# Patient Record
Sex: Male | Born: 1945
Health system: Southern US, Community
[De-identification: ages and names within clinical notes are randomized; demographics above are authoritative.]

## PROBLEM LIST (undated history)

## (undated) DIAGNOSIS — I2510 Atherosclerotic heart disease of native coronary artery without angina pectoris: Secondary | ICD-10-CM

## (undated) DIAGNOSIS — E785 Hyperlipidemia, unspecified: Secondary | ICD-10-CM

## (undated) DIAGNOSIS — H269 Unspecified cataract: Secondary | ICD-10-CM

## (undated) DIAGNOSIS — E119 Type 2 diabetes mellitus without complications: Secondary | ICD-10-CM

## (undated) DIAGNOSIS — J449 Chronic obstructive pulmonary disease, unspecified: Secondary | ICD-10-CM

## (undated) DIAGNOSIS — I10 Essential (primary) hypertension: Secondary | ICD-10-CM

## (undated) DIAGNOSIS — C801 Malignant (primary) neoplasm, unspecified: Secondary | ICD-10-CM

## (undated) HISTORY — DX: Essential (primary) hypertension: I10

## (undated) HISTORY — PX: EYE SURGERY: SHX253

## (undated) HISTORY — DX: Malignant (primary) neoplasm, unspecified: C80.1

## (undated) HISTORY — DX: Type 2 diabetes mellitus without complications: E11.9

## (undated) HISTORY — DX: Hyperlipidemia, unspecified: E78.5

## (undated) HISTORY — DX: Unspecified cataract: H26.9

## (undated) HISTORY — DX: Atherosclerotic heart disease of native coronary artery without angina pectoris: I25.10

---

## 2001-11-19 ENCOUNTER — Encounter: Payer: Self-pay | Admitting: Family Medicine

## 2001-11-19 ENCOUNTER — Encounter: Admission: RE | Admit: 2001-11-19 | Discharge: 2001-11-19 | Payer: Self-pay | Admitting: Family Medicine

## 2006-11-27 ENCOUNTER — Observation Stay (HOSPITAL_COMMUNITY): Admission: RE | Admit: 2006-11-27 | Discharge: 2006-11-28 | Payer: Self-pay | Admitting: Cardiology

## 2006-12-04 ENCOUNTER — Ambulatory Visit (HOSPITAL_COMMUNITY): Admission: RE | Admit: 2006-12-04 | Discharge: 2006-12-04 | Payer: Self-pay | Admitting: Cardiology

## 2006-12-30 HISTORY — PX: OTHER SURGICAL HISTORY: SHX169

## 2007-01-27 ENCOUNTER — Encounter (INDEPENDENT_AMBULATORY_CARE_PROVIDER_SITE_OTHER): Payer: Self-pay | Admitting: *Deleted

## 2007-01-27 ENCOUNTER — Inpatient Hospital Stay (HOSPITAL_COMMUNITY): Admission: RE | Admit: 2007-01-27 | Discharge: 2007-01-28 | Payer: Self-pay | Admitting: Vascular Surgery

## 2007-02-09 ENCOUNTER — Ambulatory Visit: Payer: Self-pay | Admitting: Vascular Surgery

## 2007-03-09 ENCOUNTER — Ambulatory Visit: Payer: Self-pay | Admitting: Vascular Surgery

## 2007-05-11 ENCOUNTER — Ambulatory Visit: Payer: Self-pay | Admitting: Vascular Surgery

## 2007-06-23 ENCOUNTER — Ambulatory Visit: Payer: Self-pay | Admitting: Gastroenterology

## 2007-07-20 ENCOUNTER — Ambulatory Visit: Payer: Self-pay | Admitting: Gastroenterology

## 2007-07-20 DIAGNOSIS — K5730 Diverticulosis of large intestine without perforation or abscess without bleeding: Secondary | ICD-10-CM | POA: Insufficient documentation

## 2007-08-12 ENCOUNTER — Ambulatory Visit: Payer: Self-pay | Admitting: Vascular Surgery

## 2008-02-19 ENCOUNTER — Ambulatory Visit: Payer: Self-pay | Admitting: Vascular Surgery

## 2008-04-22 DIAGNOSIS — E785 Hyperlipidemia, unspecified: Secondary | ICD-10-CM | POA: Insufficient documentation

## 2008-04-22 DIAGNOSIS — I10 Essential (primary) hypertension: Secondary | ICD-10-CM | POA: Insufficient documentation

## 2008-04-22 DIAGNOSIS — Z8551 Personal history of malignant neoplasm of bladder: Secondary | ICD-10-CM | POA: Insufficient documentation

## 2008-08-19 ENCOUNTER — Ambulatory Visit: Payer: Self-pay | Admitting: Vascular Surgery

## 2009-02-10 ENCOUNTER — Ambulatory Visit: Payer: Self-pay | Admitting: Vascular Surgery

## 2009-08-11 ENCOUNTER — Ambulatory Visit: Payer: Self-pay | Admitting: Vascular Surgery

## 2010-12-27 ENCOUNTER — Ambulatory Visit (HOSPITAL_COMMUNITY)
Admission: RE | Admit: 2010-12-27 | Discharge: 2010-12-27 | Payer: Self-pay | Source: Home / Self Care | Attending: Cardiology | Admitting: Cardiology

## 2011-03-11 LAB — GLUCOSE, CAPILLARY
Glucose-Capillary: 159 mg/dL — ABNORMAL HIGH (ref 70–99)
Glucose-Capillary: 160 mg/dL — ABNORMAL HIGH (ref 70–99)
Glucose-Capillary: 95 mg/dL (ref 70–99)

## 2011-05-14 NOTE — Procedures (Signed)
BYPASS GRAFT EVALUATION   INDICATION:  Followup evaluation of right leg bypass graft.   HISTORY:  Diabetes:  No.  Cardiac:  History of coronary stent.  Hypertension:  Yes.  Smoking:  Pack per day.  Previous Surgery:  Right external iliac to profunda artery bypass graft.   SINGLE LEVEL ARTERIAL EXAM                               RIGHT              LEFT  Brachial:                    128                128  Anterior tibial:             64                 120  Posterior tibial:            88                 118  Peroneal:  Ankle/brachial index:        0.69               0.94   PREVIOUS ABI:  Date:  08/19/2008  RIGHT:  0.67  LEFT:  0.94   LOWER EXTREMITY BYPASS GRAFT DUPLEX EXAM:   DUPLEX:  Doppler arterial waveforms are biphasic to monophasic proximal  to, within and distal to the bypass graft with an elevated peak systolic  velocity at the first distal anastomosis of 162 cm/second which is  stable compared to previous studies.   IMPRESSION:  1. ABIs are stable from previous studies bilaterally.  2. Patent right external iliac to profunda femoris artery bypass      graft.  3. Left ABI suggests mild arterial occlusive disease.   ___________________________________________  Larina Earthly, M.D.   MC/MEDQ  D:  02/10/2009  T:  02/10/2009  Job:  [id number]

## 2011-05-14 NOTE — Procedures (Signed)
BYPASS GRAFT EVALUATION   INDICATION:  Follow-up evaluation of lower extremity bypass graft.   HISTORY:  Diabetes:  No.  Cardiac:  History of coronary stent.  Hypertension:  Yes.  Smoking:  Yes.  Previous Surgery:  Right external iliac to profunda artery bypass graft.   SINGLE LEVEL ARTERIAL EXAM                               RIGHT              LEFT  Brachial:                    135                138  Anterior tibial:             84                 144  Posterior tibial:            94                 142  Peroneal:  Ankle/brachial index:        0.68               1.04   PREVIOUS ABI:  Date: 02/10/2009  RIGHT:  0.69  LEFT:  0.94   LOWER EXTREMITY BYPASS GRAFT DUPLEX EXAM:   DUPLEX:  1. Patent right external iliac to profunda femoral artery bypass graft      with no evidence of focal stenosis.  2. Biphasic duplex waveform noted within graft and native artery.   IMPRESSION:  1. Patent right bypass graft with no evidence of focal stenosis.  2. Right lower extremity ABI suggests moderate arterial disease with      biphasic Doppler waveform.  3. Normal left lower extremity ABI with biphasic Doppler waveforms.   ___________________________________________  Larina Earthly, M.D.   AC/MEDQ  D:  08/11/2009  T:  08/11/2009  Job:  [id number]

## 2011-05-14 NOTE — Procedures (Signed)
BYPASS GRAFT EVALUATION   INDICATION:  Followup evaluation of right lower extremity bypass graft.   HISTORY:  Diabetes:  No  Cardiac:  No  Hypertension:  Yes  Smoking:  Yes  Previous Surgery:  Right common femoral artery aneurysm repair.  Right  external iliac artery to two separate profunda branches on January 27, 2007.   SINGLE LEVEL ARTERIAL EXAM                               RIGHT              LEFT  Brachial:                    146                148  Anterior tibial:             96                 146  Posterior tibial:            98                 48  Peroneal:  Ankle/brachial index:        0.66               1   PREVIOUS ABI:  Date: May 11, 2007  RIGHT:  0.71  LEFT:  greater than 1   LOWER EXTREMITY BYPASS GRAFT DUPLEX EXAM:   DUPLEX:  Wave forms are biphasic proximal to within and distal to the  external iliac to profunda bypass graft.  There is a slightly elevated  peak systolic velocity at the 1st anastomosis (181-cm/sec).   IMPRESSION:  1. Ankle-brachial indices are stable from previous studies      bilaterally.  2. Patent right external iliac to profunda bypass graft.   ___________________________________________  Larina Earthly, M.D.   MC/MEDQ  D:  08/12/2007  T:  08/13/2007  Job:  [id number]

## 2011-05-14 NOTE — Procedures (Signed)
BYPASS GRAFT EVALUATION   INDICATION:  Followup right external iliac artery  to profunda artery  bypass graft.   HISTORY:  Diabetes:  No.  Cardiac:  No.  Hypertension:  Yes.  Smoking:  Yes.  Previous Surgery:  Please see above.   SINGLE LEVEL ARTERIAL EXAM                               RIGHT              LEFT  Brachial:                    120                116  Anterior tibial:             89                 130  Posterior tibial:            99                 120  Peroneal:  Ankle/brachial index:        0.83               1.08   PREVIOUS ABI:  Date: 08/12/2007  RIGHT:  0.66  LEFT:  >1   LOWER EXTREMITY BYPASS GRAFT DUPLEX EXAM:   DUPLEX:  Patent right external iliac to profunda artery bypass graft, no  evidence of focal stenosis.   IMPRESSION:  1. Patent right external iliac artery to profunda artery bypass graft      with no evidence of focal stenosis.  2. Moderately abnormal ABI with monophasic Doppler waveform noted in      the right leg.  3. Status post right external iliac artery  to profunda artery bypass      graft.  4. Normal ABI with biphasic Doppler waveform noted in the left leg.      ___________________________________________  Larina Earthly, M.D.   MG/MEDQ  D:  02/19/2008  T:  02/21/2008  Job:  [id number]

## 2011-05-14 NOTE — Assessment & Plan Note (Signed)
[facility]                         GASTROENTEROLOGY OFFICE NOTE   LIBERTY, STEAD                        MRN:          [id number]  DATE:06/23/2007                            DOB:          [birth year]    Patrick [name] is referred through the courtesy of Dr. Neva Seat for screening  colonoscopy.   Patrick [name] is a 65 year old white male retiree who has no real  gastrointestinal symptomatology.  He has regular bowel movements with  melena or hematochezia, denies abdominal pain.  His appetite is good and  his weight is stable.  He has never had a colonoscopy exam, and denies  any history of hepatobiliary problems.   PAST MEDICAL HISTORY:  Remarkable for atherogenesis, and he had an acute  thrombectomy performed of his right lower extremity arterial  distribution by Dr. Arbie Cookey on January 27, 2007.  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  Also, during his hospitalization, he developed chest  pain, had angiography, had a cardiac stent placed by Dr. Jacinto Halim.   The patient is on Plavix 75 mg a day, but had a severe, almost  anaphylactic reaction to ASPIRIN and NSAIDs.  He also has a history of  CEPHALEXIN allergy.   In addition to his peripheral vascular disease and coronary artery  disease, has mild hypertension and hyperlipidemia.  He takes Altace 10  mg a day, Crestor 20 mg a day, and hydrochlorothiazide 12.5 mg a day.   FAMILY HISTORY:  Remarkable for a first cousin with colon cancer.  His  father had metastatic colon cancer, his mother breast cancer.  Several  family members have diabetes.   SOCIAL HISTORY:  The patient is retired from [employer]. and has a Systems analyst.  He does smoke a pack a day, but denies alcohol abuse.   REVIEW OF SYSTEMS:  Noncontributory except for some vague arthralgias.  He denies current cardiovascular, pulmonary, genitourinary, neurologic,   orthopedic, or neuropsychiatric problems.   PHYSICAL EXAMINATION:  Shows him to be a healthy-appearing white male in  no distress appearing his stated age.  He is 5 feet 9 inches tall and weighs 163 pounds.  Blood pressure is  128/78 and pulse was 68 and regular.  I could not appreciate stigmata of chronic liver disease.  Chest was clear and he was in a regular rhythm without murmurs, gallops,  or rubs.  Abdominal exam showed no organomegaly, masses, or tenderness.  Bowel  sounds were normal with no peripheral edema, phlebitis or swollen  joints.  Mental status was clear.  Rectal exam was negative.  The patient relates that Dr. Neva Seat did this  recently and stool was guaiac negative.   ASSESSMENT:  1. Need for screening colonoscopy because of his family history and      age.  2. Hypertensive coronary vascular disease with hyperlipidemia.  3. Status post coronary artery stenting.  4. 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  5. History of chronic cigarette abuse.  RECOMMENDATIONS:  1. Continue on medications listed above.  2. Colonoscopy while on Plavix therapy.  I have advised the patient      there is increased risk of bleeding should we need to do      polypectomy, but I do not think he can discontinue this medication      per his recent vascular problems.  3. The patient should be referred for smoking cessation consideration.     Vania Rea. Jarold Motto, MD, Caleen Essex, FAGA  Electronically Signed    DRP/MedQ  DD: 06/23/2007  DT: 06/23/2007  Job #: [id number]   cc:   Silas Sacramento, M.D.  Larina Earthly, M.D.  Cristy Hilts. Jacinto Halim, MD

## 2011-05-14 NOTE — Procedures (Signed)
BYPASS GRAFT EVALUATION   INDICATION:  Follow up, right lower extremity bypass graft, patient  states no new pain.   HISTORY:  Diabetes:  No.  Cardiac:  Stent.  Hypertension:  Yes.  Smoking:  Yes.  Previous Surgery:  Right external iliac artery to two separate branches  of profunda artery on 01/27/07.   SINGLE LEVEL ARTERIAL EXAM                               RIGHT              LEFT  Brachial:                    150                148  Anterior tibial:             80                 136  Posterior tibial:            100                140  Peroneal:  Ankle/brachial index:        0.67               0.94   PREVIOUS ABI:  Date: 02/19/08  RIGHT:  0.83  LEFT:  1.08   LOWER EXTREMITY BYPASS GRAFT DUPLEX EXAM:   DUPLEX:  1. Doppler arterial waveforms appear biphasic proximal to, within, and      distal to the bypass graft.  2. Stable, slight elevation in velocities at first distal anastomosis      of 187 cm/s.   IMPRESSION:  1. Patent right external iliac artery to profunda artery bypass graft      with slight stable elevation of velocities at the first of two      distal anastomoses.  2. Right ankle brachial index shows a decrease from previous study;      however, consistent with studies prior to that.  3. The left ankle brachial index shows slight decrease from previous      studies.   ___________________________________________  Larina Earthly, M.D.   AS/MEDQ  D:  08/19/2008  T:  08/19/2008  Job:  [id number]

## 2011-05-17 NOTE — Cardiovascular Report (Signed)
NAMEIHOR, MEINZER                 ACCOUNT NO.:  [id number]   MEDICAL RECORD NO.:  [id number]          PATIENT TYPE:  AMB   LOCATION:  SDS                          FACILITY:  [facility]   PHYSICIAN:  Vonna Kotyk R. Jacinto Halim, MD       DATE OF BIRTH:  19-Jul-1946   DATE OF PROCEDURE:  DATE OF DISCHARGE:  12/04/2006                            CARDIAC CATHETERIZATION   REFERRING PHYSICIAN:  Silas Sacramento, M.D.   PROCEDURE PERFORMED:  1. Distal abdominal aortogram with bifemoral runoff.  2. Right iliac arteriography.  3. Femoral arteriography.   INDICATION:  Mr. Waleed Dettman is a 65 year old gentleman with known  coronary artery disease, history of PTCA and stenting to his right  coronary artery with a history of angioplasty to his right coronary  artery on November 27, 2006, with the implantation of a 3.5 x 32-mm non-  drug-eluting stent who has been complaining of severe lifestyle limiting  claudication and had a markedly abnormal ABI of 0.55 on the right and  0.91 on the left and the old patient Doppler evaluation was indicative  of occlusive disease of the right superficial femoral artery.  Given  this, he is brought to the catheterization for evaluation of his  peripheral anatomy.   ANGIOGRAPHIC DATA:  Distal abdominal aortogram with bifemoral runoff:  Distal abdominal aorta with bifemoral runoff revealed widely patent  aorto-iliac bifurcation.  The right common iliac and external iliac  artery showed mild diffuse disease.  The right superficial femoral was  occluded in its origin and it was flesh occluded to its origin and  reconstitutes in the mid segment by collaterals from the profunda  femoral artery.  There was two-vessel runoff noted below the right knee  with the posterior tibial artery being patent, peroneal artery being  patent in the proximal segment, and there was reconstitution of the  peroneal artery distally.  The anterior tibial artery was occluded.   The right external and common  iliac arteries showed mild to moderate  luminal irregularity.   The right profunda femoral artery which gives collaterals to the distal  right superficial femoral artery has a focal 90% stenosis with a 60-mm  pressure gradient across this stenosis.   Left common iliac artery and external iliac artery showed mild disease.  The left superficial artery had mild to moderate 10% to 20% stenosis.  Otherwise, there was three-vessel runoff noted below the left knee.  There is mild to moderate stenosis of the left SFA constituting 20% to  30%, and there was three-vessel runoff noted below the left knee.  The  peroneal artery is not well visualized in the mid segment but  reconstitutes distally.   IMPRESSION:  1. Fresh occlusion of right superficial femoral artery.  Distal      superficial femoral artery reconstitutes distally by laterals from      the profunda femoral artery; however, the profunda femoral artery      itself has a high-grade 90% stenosis with a 60-mm pressure gradient      across this stenosis.  2. Occluded right anterior tibial artery and  occlusion of the right      mid peroneal artery.  3. Occlusion of the mid segment of the left peroneal artery which      reconstitutes distally.  The left leg has mild disease.   RECOMMENDATIONS:  1. Based on his anatomy, because of the proximity of his profunda      femoral artery close to his joint, he will be referred for elective      profundoplasty.  This is to be performed in the next couple of      weeks.  2. Again, risk modification is indicated.   TECHNIQUE OF THE PROCEDURE:  Under usual sterile precautions, using a 5-  French left femoral artery access, a 5-French pigtail catheter was  advanced in the distal abdominal aorta and a distal abdominal aortogram  with bifemoral runoff was performed.  Then crossing over from the left  iliac artery into the right iliac artery, an endo catheter was advanced  into the right external  iliac artery.  Then angiography was performed.  Then the superficial artery stenosis was crossed over with the help of a  0.014-inch guidewire and angiography was repeated.  After flushing with  saline, it was carefully pulled back and pressure gradient across the  high-grade stenosis was documented.  Then careful pullback was performed  through the external iliac artery and the common iliac artery and right  iliac arteriography was performed to evaluate the significance of  stenosis in the iliac arterial system on the right.  There was no  pressure gradient.  Hence, the catheter was pulled out of the body and  the patient was transferred to Recovery in stable condition.  Patient  tolerated the procedure well.  No complications noted.      Cristy Hilts. Jacinto Halim, MD  Electronically Signed     JRG/MEDQ  D:  12/04/2006  T:  12/05/2006  Job:  [id number]   cc:   Larina Earthly, M.D.  Silas Sacramento, M.D.

## 2011-05-17 NOTE — Discharge Summary (Signed)
[name], Patrick [name]                 ACCOUNT NO.:  [id number]   MEDICAL RECORD NO.:  [id number]          PATIENT TYPE:  OBV   LOCATION:  6524                         FACILITY:  [facility]   PHYSICIAN:  Cristy Hilts. Jacinto Halim, MD       DATE OF BIRTH:  1946/08/03   DATE OF ADMISSION:  11/27/2006  DATE OF DISCHARGE:  11/28/2006                               DISCHARGE SUMMARY   HISTORY OF PRESENT ILLNESS:  Mr. Haste is a 65 year old outpatient of  Dr. Jacinto Halim who came to the hospital for outpatient cardiac  catheterization.  He was initially seen by Dr. Jacinto Halim on November 07, 2006, at which time he had been referred by Dr. Silas Sacramento.  The  patient was concerned about possible coronary artery disease.  He had  diagnosed peripheral artery disease and by Doppler with decreased ABI.  He was having claudication symptoms.  He was only able to walk less than  2 blocks before claudication occurred.  He had no chest pain, but he was  sent on to undergo a Cardiolite test.  This was performed on November 11, 2006.  It was positive for ischemia, mild, superimposed in the basal  inferior and midinferior septum and midinferior apical and inferior  region.  He also had a chronotropic incompetence and an inability to  achieve his target heart rate and also had symptoms of right half  claudication.  His EF was 54%.  It was decided that he should undergo  cardiac cath.  This was performed by Dr. Jacinto Halim on November 27, 2006.  He  had 80% stenosis in his RCA.  He underwent a liberty 3.5 x 32 stenting.  His EF was 65%.  He had only single vessel disease.  It was decided that  he should come back and undergo a PV angiogram the following week.   At the time of discharge his blood pressure was 125/42, heart rate was  62, respirations were regular at 20, and his temperature was 98.4.   DISCHARGE MEDICATIONS:  1. Plavix 75 mg 1 time per day.  2. Altace 10 mg 1 time per day.  3. Crestor 20 mg 1 time per day.   ALLERGIES:   ASPIRIN, IBUPROFEN, AND CEPHALEXIN.   DISCHARGE DIAGNOSES:  1. Coronary artery disease.  Single vessel status post catheterization      secondary to abnormal Cardiolite test, with positive ischemia,      positive chronotropic response.  He was found to have 80% right      coronary artery lesion.  He underwent Liberte stenting (non-DES      stenting).  2. Peripheral vascular disease with known abnormal Dopplers with ankle-      brachial index on the right of 0.5, on the left 0.9, one with      positive claudication symptoms.  Perform paraventricular angiogram      next week.  3. Normal ejection fraction at 60%.  4. Tobacco smoking.  Recommendations for cessation.  5. Hyperlipidemia.      Lezlie Octave, N.P.      Cristy Hilts. Jacinto Halim,  MD  Electronically Signed    BB/MEDQ  D:  11/28/2006  T:  11/29/2006  Job:  [id number]   cc:   Silas Sacramento, M.D.

## 2011-05-17 NOTE — Cardiovascular Report (Signed)
NAMEMARQUETT, BERTOLI                 ACCOUNT NO.:  [id number]   MEDICAL RECORD NO.:  [id number]          PATIENT TYPE:  OBV   LOCATION:  6524                         FACILITY:  [facility]   PHYSICIAN:  Cristy Hilts. Jacinto Halim, MD       DATE OF BIRTH:  1946-12-13   DATE OF PROCEDURE:  DATE OF DISCHARGE:                            CARDIAC CATHETERIZATION   DATE OF PROCEDURE:  November 27, 2006.   PROCEDURE PERFORMED:  1. Left ventriculography.  2. Selective coronary angiography.  3. Abdominal aortogram.  4. PTCA and stenting of the mid right coronary artery.   INDICATION:  Mr. Patrick [name] is a 64 year old gentleman with history of  hypertension, hyperlipidemia, smoking, who was referred for evaluation  of peripheral vascular disease.  Due to his multiple cardiovascular risk  factors, he underwent a stress Myoview which was of moderate quality  with inferior wall ischemia.  Hence, he was brought to the cardiac  catheterization lab to evaluate his coronary anatomy.  The plan was to  proceed with left heart catheterization, and if the left heart  catheterization was negative, to proceed with lower extremity  arteriography.  However, because of high grade stenosis of the right  coronary artery, he underwent PTCA and stenting of the right coronary  artery, and he will brought back to the lower extremity arteriogram at a  later date.  Abdominal aortogram was done to evaluate for abdominal  atherosclerosis and renal artery stenosis given his hypertension.   HEMODYNAMIC DATA:  The left ventricular pressure 145/80 with end  diastolic pressure of 26 mmHg.  The aortic pressures were 163/95 with a  mean of 122 mmHg.  There was no pressure gradient across the aortic  valve.   Right coronary artery:  The right coronary artery is a large caliber  vessel.  Proximally, there is a 10% stenosis and followed by mild  ectasia in the mid segment, and then there is a focal 80-85% stenosis.  This is a very large RCA.   This gives off a very large PDA and a  moderate to large SPLA.  Otherwise, the RCA appeared to be smooth and  normal distally.   Left main:  Left main is a large caliber vessel, it is smooth and  normal.   Circumflex:  Circumflex is large caliber, smooth in the proximal  segment.  It continues into the AV groove after giving origin to a  moderate size OM1 and a moderate size obtuse marginal 2.  It is smooth  and normal.   Ramus intermedius:  Ramus intermedius is a moderate to large caliber  vessel, it is smooth and normal.   LAD:  LAD is a large caliber vessel.  It gives off into a small diagonal  1, and in the mid to distal segment, bifurcates into a large diagonal 2  and LAD.  The LAD itself is smooth and normal.   Left ventricle:  Left ventricular systolic function is normal with  ejection fraction of 60%.  There was no significant mitral  regurgitation.   Abdominal aortogram:  Abdominal aortogram revealed 2  renal arteries on  the right, and 1 renal artery on the left that are widely patent.  There  was no evidence of renal artery stenosis.  The aortoiliac bifurcation  was widely patent.   INTERVENTION:  Successful PTCA and stenting of the mid right coronary  artery with 3.5 x 32 mm Liberte stent which was deployed at 18  atmospheres pressure for 50 seconds.  This stent was post dilated with a  4.0 x 20 mm Maverick at 20 atmospheres.  Because of ectasia in the mid  segment of the right coronary artery, I decided to proceed with the post  dilatation with a 4.5 x 12 mm Quantum Maverick which was performed  throughout the stent at 12 atmospheres pressure for about 30 seconds  approximately.  Overall, the stenosis was reduced from 80-85% to 0% with  TIMI 3 flow maintained at the end of the procedure.   A total of 176 cc of contrast was utilized for diagnostic angiography.   TECHNIQUE OF PROCEDURE:  Under streile precautions, obtaining a 6 French  left femoral artery access, a  6 [place] multi purpose B2 catheter was  advanced into the ascending aorta.  A 0.035 JR4 Judkins catheter was  then advanced into the left ventricle and pressure marked.  A hand  contrast injection of the left ventricle was performed both in LAO and  RAO projections.  Catheter was flushed with saline, pulled back into the  ascending aorta and pressuregradients across the AV checked.  Right  coronary artery was selectively engaged and angiography was performed.  Then, the left main coronary artery was selectively engaged and  angiography was performed.  Then, the catheter was pulled back into the  abdominal aorta and abdominal aortogram was performed.   TECHNIQUE OF INTERVENTION:  A 6 French sheath was exchanged for a 7  [place] sheath using a 7 [place] FR4 guide and using Angiomax for  anticoagulation, 190 cm x 0.014 inch Asahi Prowater was utilized to  cross into the right coronary artery.  Then, I tried to proceed with the  direct stenting with a 3.5 x 12 mm Liberte stent which was deployed at  18 atmospheres pressure for 50 seconds.  This stent was post dilated  with a 4.0 x 20 mm Quantum Maverick and because of ectasia in the mid  segment, the stent was again post dilated with a 4.5 x 12 mm Quantum  Maverick at 12 atmospheres pressure for 30 seconds each x5.  While I was  trying to attempt to post dilate the 4.5 x 12 mm Quantum, I had  difficulty advancing the balloon through the proximal stent strut.  Because of the angle, the tip of the wire was hitting the stent strut.  Hence, I had to use a 0.014 inch x 190 cm Wiggle wire to maneuver  through the right coronary artery stent and then I was easily able to  advance the Quantum Maverick into the stent, and keeping the balloon  within the stent, balloon angioplasty was performed.  Post balloon  angioplasty angiography revealed excellent results.  Due to spasm, multiple episodes of IC NTG was administered.  The guidewire was  withdrawn,  angiography repeated and the catheters were pulled out of  body in the usual fashion.  Patient tolerated the procedure well with no  immediate complication noted.      Cristy Hilts. Jacinto Halim, MD  Electronically Signed     JRG/MEDQ  D:  11/27/2006  T:  11/28/2006  Job:  [id number]   cc:   Silas Sacramento, M.D.

## 2011-05-17 NOTE — Discharge Summary (Signed)
Patrick [name], Patrick [name]                 ACCOUNT NO.:  [id number]   MEDICAL RECORD NO.:  [id number]          PATIENT TYPE:  INP   LOCATION:  2003                         FACILITY:  [facility]   PHYSICIAN:  Larina Earthly, M.D.    DATE OF BIRTH:  1946/03/22   DATE OF ADMISSION:  01/27/2007  DATE OF DISCHARGE:  01/28/2007                               DISCHARGE SUMMARY   HISTORY OF PRESENT ILLNESS:  The patient is a 65 year old male who had  developed right lower extremity arterial insufficiency and right foot  ischemia.  He complained of right lower extremity claudication symptoms.  ABI showed the right side to be 0.55 and left 0.91.  He underwent an  arteriogram by Dr. Jacinto Halim on December 04, 2006 which revealed an occluded  right SFA.  He was evaluated by Dr. Tawanna Cooler Early and felt to be a  candidate for surgical revascularization.  He was admitted this  hospitalization for the procedure.   ALLERGIES:  IBUPROFEN, ASPIRIN, AND CEPHALEXIN.   MEDICATIONS PRIOR TO ADMISSION:  1. Altace 10 mg daily.  2. Plavix 75 mg daily.   PAST MEDICAL HISTORY:  Includes history of bladder tumor resection, also  coronary artery disease status post PTCA and stent placement.   FAMILY HISTORY, SOCIAL HISTORY, REVIEW OF SYSTEMS, PHYSICAL EXAMINATION:  Please see history and physical done at the time of admission.   HOSPITAL COURSE:  The patient was admitted electively on January 27, 2007.  He was taken the operating room at which time he underwent the  following procedure:  1) Thrombectomy of the iliac artery; 2) Resection  of the right common femoral artery aneurysm; 3) Replacement with an 8-mm  Hemashield graft in the distal external iliac and into two profunda  femoris branches.  This procedure was performed by Gretta Began, M.D.,  tolerated well, and he was taken to the postanesthesia care unit in  stable condition.   POSTOPERATIVE HOSPITAL COURSE:  The patient has done very well.  His  right lower extremity  showed no evidence of hematoma or drainage.  He  had a palpable posterior tibial pulse.  His foot was warm and well  perfused.  He ambulated well on postoperative day one.  He was  tolerating all routine activities commensurate for level of  postoperative convalescence using standard protocols.  He was deemed to  be acceptable for discharge on January 28, 2007.  Medications on  discharge were as preoperatively and additionally for pain Tylox one or  two every 4-6 hours as needed.   INSTRUCTIONS:  The patient received written instructions in regard to  medications, activity, diet, wound care and followup.  Followup will  include an appointment to see Dr. Arbie Cookey on February 16, 2007 at 1 p.m.   CONDITION ON DISCHARGE:  Stable, improved.   FINAL DIAGNOSES:  1. Right lower extremity femoral artery aneurysm now status post      repair with other procedures as dictated above due to right leg      ischemia and profunda stenosis as well.  2. Hypertension.  3. Coronary artery disease.  4. Tobacco abuse.  5. History of bladder tumor resection.      Rowe Clack, P.A.-C.      Larina Earthly, M.D.  Electronically Signed    WEG/MEDQ  D:  04/22/2007  T:  04/22/2007  Job:  [id number]   cc:   Larina Earthly, M.D.

## 2011-05-17 NOTE — Op Note (Signed)
NAMEORLONDO, HOLYCROSS                 ACCOUNT NO.:  [id number]   MEDICAL RECORD NO.:  [id number]          PATIENT TYPE:  INP   LOCATION:  2003                         FACILITY:  [facility]   PHYSICIAN:  Larina Earthly, M.D.    DATE OF BIRTH:  09/29/1946   DATE OF PROCEDURE:  01/27/2007  DATE OF DISCHARGE:                               OPERATIVE REPORT   PREOPERATIVE DIAGNOSIS:  Right leg ischemia with profunda stenosis.   POSTOPERATIVE DIAGNOSES:  Thrombosis of external iliac, common femoral,  and profundus femoris artery with common femoral artery aneurysm.   PROCEDURE:  1. Thrombectomy of the iliac artery.  2. Resection of right common femoral artery aneurysm.  3. Replacement with an 8 mm Hemashield graft in the distal external      iliac and into 2 profunda femoris branches.   SURGEON:  Larina Earthly, M.D.   ASSISTANT:  Stephanie Acre Dominick, PA-C   ANESTHESIA:  General endotracheal.   COMPLICATIONS:  None.   CONDITION:  The patient to recovery room stable.   PROCEDURE IN DETAIL:  The patient was taken to the operating room and  placed in supine position, and the right groin draped in the usual  sterile fashion.  An incision was made over the femoral artery and  carried down to isolate the distal external iliac artery under the  inguinal ligament, the common femoral artery, superficial femoral  artery, and 2 large profundus femoris artery branches.  The patient had  clearly thrombosis femoral artery with no pulse present.  After this was  all isolated, the patient was given 7,000 units of intravenous heparin.  After circulation time, the common femoral artery was opened  longitudinally and the chronic thrombus was removed.  This was then  extended down to the superficial femoral artery.  The superficial  femoral artery itself was chronically occluded and this was ligated.  There was a profundus femoris branch take-off that was not visualized on  the preoperative arteriogram that  was large and was extending further  down onto this, actually was patent after the first branch.  This was  preserved for eventual bypass.  The profundus femoris branch that had  been seen on arteriogram was thrombosed and was endarterectomized and  this orifice was then widely patent after the endarterectomy.  The  thrombus was removed from the orifice of the profunda and there was  excellent backbleeding from this as well.  This was occluded with  vascular clamp.   Next, a 5 Fogarty catheter was passed up through the iliac artery and  the iliac artery was thrombectomized with excellent end flow after the  thrombectomy.  This was occluded with the vascular clamp.  An 8 mm  Hemashield graft was brought onto the field.  This was spatulated and  sewn with the hood encompassing both the profunda branches.  This was  with a running 6-0 Prolene suture.  This was then cut to the appropriate  length and was sewn end-to-end to the distal external iliac artery with  a running 5-0 Prolene suture.  Prior to completion of  anastomosis, usual  flush maneuvers were undertaken.  The anastomosis was completed and  excellent flow was noted in both profunda branches.  The patient was  given 50 mg of protamine to reverse the heparin.  The wound was  irrigated with saline. Hemostasis with electrocautery.  Wounds were  closed with 2-0 Vicryl in the subcutaneous tissues.  The skin was closed  with a 3-0 subcuticular Vicryl stitch.  Sterile dressing was applied and  the patient was taken to the recovery room in stable condition.      Larina Earthly, M.D.  Electronically Signed     TFE/MEDQ  D:  01/27/2007  T:  01/28/2007  Job:  [id number]   cc:   Cristy Hilts. Jacinto Halim, MD

## 2011-12-14 ENCOUNTER — Ambulatory Visit (INDEPENDENT_AMBULATORY_CARE_PROVIDER_SITE_OTHER): Payer: Medicare Other

## 2011-12-14 DIAGNOSIS — J40 Bronchitis, not specified as acute or chronic: Secondary | ICD-10-CM

## 2011-12-14 DIAGNOSIS — J029 Acute pharyngitis, unspecified: Secondary | ICD-10-CM

## 2011-12-14 DIAGNOSIS — J00 Acute nasopharyngitis [common cold]: Secondary | ICD-10-CM

## 2012-12-17 ENCOUNTER — Encounter: Payer: Self-pay | Admitting: Vascular Surgery

## 2013-02-03 ENCOUNTER — Ambulatory Visit
Admission: RE | Admit: 2013-02-03 | Discharge: 2013-02-03 | Disposition: A | Discharge status: Home / Self Care | Payer: Medicare Other | Source: Ambulatory Visit | Attending: Cardiology | Admitting: Cardiology

## 2013-02-03 ENCOUNTER — Other Ambulatory Visit: Payer: Self-pay | Admitting: Cardiology

## 2013-02-03 DIAGNOSIS — J449 Chronic obstructive pulmonary disease, unspecified: Secondary | ICD-10-CM

## 2013-02-03 DIAGNOSIS — R0602 Shortness of breath: Secondary | ICD-10-CM

## 2013-09-10 ENCOUNTER — Encounter: Payer: Self-pay | Admitting: Radiology

## 2013-09-10 DIAGNOSIS — F17200 Nicotine dependence, unspecified, uncomplicated: Secondary | ICD-10-CM

## 2013-09-10 DIAGNOSIS — I2510 Atherosclerotic heart disease of native coronary artery without angina pectoris: Secondary | ICD-10-CM | POA: Insufficient documentation

## 2013-09-15 ENCOUNTER — Encounter: Payer: Self-pay | Admitting: Radiology

## 2013-09-15 DIAGNOSIS — I6529 Occlusion and stenosis of unspecified carotid artery: Secondary | ICD-10-CM | POA: Insufficient documentation

## 2013-09-15 DIAGNOSIS — F17200 Nicotine dependence, unspecified, uncomplicated: Secondary | ICD-10-CM

## 2013-09-15 DIAGNOSIS — I739 Peripheral vascular disease, unspecified: Secondary | ICD-10-CM | POA: Insufficient documentation

## 2014-03-17 ENCOUNTER — Encounter: Payer: Self-pay | Admitting: Family Medicine

## 2014-03-17 DIAGNOSIS — I70209 Unspecified atherosclerosis of native arteries of extremities, unspecified extremity: Secondary | ICD-10-CM

## 2014-05-11 ENCOUNTER — Encounter: Payer: Self-pay | Admitting: Family Medicine

## 2014-05-13 ENCOUNTER — Encounter: Payer: Self-pay | Admitting: Family Medicine

## 2015-08-24 ENCOUNTER — Ambulatory Visit (INDEPENDENT_AMBULATORY_CARE_PROVIDER_SITE_OTHER): Payer: Medicare Other | Admitting: Family Medicine

## 2015-08-24 VITALS — BP 140/60 | HR 68 | Temp 97.7°F | Resp 18 | Ht 68.5 in | Wt 134.2 lb

## 2015-08-24 DIAGNOSIS — J209 Acute bronchitis, unspecified: Secondary | ICD-10-CM | POA: Diagnosis not present

## 2015-08-24 MED ORDER — BENZONATATE 100 MG PO CAPS: 100.0000 mg | ORAL_CAPSULE | Freq: Three times a day (TID) | ORAL | Status: DC | PRN

## 2015-08-24 MED ORDER — AZITHROMYCIN 250 MG PO TABS: ORAL_TABLET | ORAL | Status: DC

## 2015-08-24 NOTE — Progress Notes (Signed)
Patient ID: Patrick [name] MRN: [id number], DOB: 26-Jul-1946, 69 y.o. Date of Encounter: 08/24/2015, 3:44 PM  Primary Physician: No primary care provider on file.  Chief Complaint:  Chief Complaint  Patient presents with  . Cough    congestion x 1 month    HPI: 69 y.o. year old male presents with a 30 day history of nasal congestion, post nasal drip, sore throat, and cough. Mild sinus pressure. Afebrile. No chills. Nasal congestion thick and green/yellow. Cough is productive of green/yellow sputum and not associated with time of day. Ears feel full, leading to sensation of muffled hearing. Has tried OTC cold preps without success. No GI complaints.   No sick contacts, recent antibiotics, or recent travels.   No leg trauma, sedentary periods, h/o cancer, or tobacco use.  Past Medical History  Diagnosis Date  . Hyperlipidemia   . Hypertension   . CAD (coronary artery disease)      Home Meds: Prior to Admission medications   Medication Sig Start Date End Date Taking? Authorizing Provider  atorvastatin (LIPITOR) 40 MG tablet Take 40 mg by mouth daily.   Yes Historical Provider, MD  carvedilol (COREG) 3.125 MG tablet Take 3.125 mg by mouth 2 (two) times daily with a meal.   Yes Historical Provider, MD  cilostazol (PLETAL) 100 MG tablet Take 100 mg by mouth 2 (two) times daily.   Yes Historical Provider, MD  clopidogrel (PLAVIX) 75 MG tablet Take 75 mg by mouth daily.   Yes Historical Provider, MD  hydrochlorothiazide (HYDRODIURIL) 12.5 MG tablet Take 12.5 mg by mouth daily.   Yes Historical Provider, MD  ramipril (ALTACE) 10 MG capsule Take 10 mg by mouth daily.   Yes Historical Provider, MD  Vitamin D, Ergocalciferol, (DRISDOL) 50000 UNITS CAPS capsule Take 50,000 Units by mouth every 7 (seven) days.   Yes Historical Provider, MD    Allergies:  Allergies  Allergen Reactions  . Aspirin Swelling  . Cephalexin Itching  . Ibuprofen Swelling    Social History   Social History    . Marital Status: Single    Spouse Name: N/A  . Number of Children: N/A  . Years of Education: N/A   Occupational History  . Not on file.   Social History Main Topics  . Smoking status: Current Every Day Smoker  . Smokeless tobacco: Not on file  . Alcohol Use: No  . Drug Use: No  . Sexual Activity: Not on file   Other Topics Concern  . Not on file   Social History Narrative  . No narrative on file     Review of Systems: Constitutional: negative for chills, fever, night sweats or weight changes Cardiovascular: negative for chest pain or palpitations Respiratory: negative for hemoptysis, wheezing, or shortness of breath Abdominal: negative for abdominal pain, nausea, vomiting or diarrhea Dermatological: negative for rash Neurologic: negative for headache   Physical Exam: Blood pressure 140/60, pulse 68, temperature 97.7 F (36.5 C), temperature source Oral, resp. rate 18, height 5' 8.5" (1.74 m), weight 134 lb 3.2 oz (60.873 kg), SpO2 96 %., Body mass index is 20.11 kg/(m^2). General: Well developed, well nourished, in no acute distress. Head: Normocephalic, atraumatic, eyes without discharge, sclera non-icteric, nares are congested. Bilateral auditory canals clear, TM's are without perforation, pearly grey with reflective cone of light bilaterally. No sinus TTP. Oral cavity moist, dentition normal. Posterior pharynx with post nasal drip and mild erythema. No peritonsillar abscess or tonsillar exudate. Neck: Supple. No thyromegaly. Full  ROM. No lymphadenopathy. Lungs: Coarse breath sounds bilaterally with some decrease bilaterally and exp wheezes Heart: RRR with S1 S2. No murmurs, rubs, or gallops appreciated. Msk:  Strength and tone normal for age. Extremities: No clubbing or cyanosis. No edema. Neuro: Alert and oriented X 3. Moves all extremities spontaneously. CNII-XII grossly in tact. Psych:  Responds to questions appropriately with a normal affect.     ASSESSMENT  AND PLAN:  69 y.o. year old male with bronchitis. -   ICD-9-CM ICD-10-CM   1. Acute bronchitis, unspecified organism 466.0 J20.9 benzonatate (TESSALON) 100 MG capsule     azithromycin (ZITHROMAX) 250 MG tablet   -Tylenol/Motrin prn -Rest/fluids -RTC precautions -RTC 3-5 days if no improvement  Signed, Robyn Haber, MD 08/24/2015 3:44 PM

## 2015-08-24 NOTE — Patient Instructions (Signed)

## 2015-12-30 ENCOUNTER — Inpatient Hospital Stay (HOSPITAL_COMMUNITY)
Admission: EM | Admit: 2015-12-30 | Discharge: 2016-01-04 | DRG: 871 | Disposition: A | Discharge status: Home / Self Care | Payer: Medicare Other | Attending: Internal Medicine | Admitting: Internal Medicine

## 2015-12-30 ENCOUNTER — Encounter (HOSPITAL_COMMUNITY)
Admission: EM | Disposition: A | Discharge status: Home / Self Care | Payer: Self-pay | Source: Home / Self Care | Attending: Internal Medicine

## 2015-12-30 ENCOUNTER — Emergency Department (HOSPITAL_COMMUNITY): Payer: Medicare Other

## 2015-12-30 ENCOUNTER — Encounter (HOSPITAL_COMMUNITY): Payer: Self-pay | Admitting: Emergency Medicine

## 2015-12-30 DIAGNOSIS — R739 Hyperglycemia, unspecified: Secondary | ICD-10-CM | POA: Diagnosis not present

## 2015-12-30 DIAGNOSIS — A419 Sepsis, unspecified organism: Secondary | ICD-10-CM | POA: Diagnosis present

## 2015-12-30 DIAGNOSIS — F17200 Nicotine dependence, unspecified, uncomplicated: Secondary | ICD-10-CM | POA: Diagnosis present

## 2015-12-30 DIAGNOSIS — J181 Lobar pneumonia, unspecified organism: Secondary | ICD-10-CM

## 2015-12-30 DIAGNOSIS — I213 ST elevation (STEMI) myocardial infarction of unspecified site: Secondary | ICD-10-CM

## 2015-12-30 DIAGNOSIS — Z7902 Long term (current) use of antithrombotics/antiplatelets: Secondary | ICD-10-CM

## 2015-12-30 DIAGNOSIS — J09X1 Influenza due to identified novel influenza A virus with pneumonia: Secondary | ICD-10-CM | POA: Diagnosis present

## 2015-12-30 DIAGNOSIS — J440 Chronic obstructive pulmonary disease with acute lower respiratory infection: Secondary | ICD-10-CM | POA: Diagnosis present

## 2015-12-30 DIAGNOSIS — I248 Other forms of acute ischemic heart disease: Secondary | ICD-10-CM | POA: Diagnosis present

## 2015-12-30 DIAGNOSIS — I5021 Acute systolic (congestive) heart failure: Secondary | ICD-10-CM | POA: Diagnosis present

## 2015-12-30 DIAGNOSIS — I739 Peripheral vascular disease, unspecified: Secondary | ICD-10-CM | POA: Diagnosis present

## 2015-12-30 DIAGNOSIS — I10 Essential (primary) hypertension: Secondary | ICD-10-CM | POA: Diagnosis present

## 2015-12-30 DIAGNOSIS — J9622 Acute and chronic respiratory failure with hypercapnia: Secondary | ICD-10-CM | POA: Diagnosis present

## 2015-12-30 DIAGNOSIS — Z955 Presence of coronary angioplasty implant and graft: Secondary | ICD-10-CM | POA: Diagnosis not present

## 2015-12-30 DIAGNOSIS — J9621 Acute and chronic respiratory failure with hypoxia: Secondary | ICD-10-CM | POA: Diagnosis present

## 2015-12-30 DIAGNOSIS — I2510 Atherosclerotic heart disease of native coronary artery without angina pectoris: Secondary | ICD-10-CM | POA: Diagnosis present

## 2015-12-30 DIAGNOSIS — R69 Illness, unspecified: Secondary | ICD-10-CM

## 2015-12-30 DIAGNOSIS — T380X5A Adverse effect of glucocorticoids and synthetic analogues, initial encounter: Secondary | ICD-10-CM | POA: Diagnosis not present

## 2015-12-30 DIAGNOSIS — J189 Pneumonia, unspecified organism: Secondary | ICD-10-CM | POA: Diagnosis present

## 2015-12-30 DIAGNOSIS — R0602 Shortness of breath: Secondary | ICD-10-CM | POA: Diagnosis not present

## 2015-12-30 DIAGNOSIS — E785 Hyperlipidemia, unspecified: Secondary | ICD-10-CM | POA: Diagnosis present

## 2015-12-30 DIAGNOSIS — J441 Chronic obstructive pulmonary disease with (acute) exacerbation: Secondary | ICD-10-CM | POA: Diagnosis present

## 2015-12-30 DIAGNOSIS — Z720 Tobacco use: Secondary | ICD-10-CM

## 2015-12-30 DIAGNOSIS — I2489 Other forms of acute ischemic heart disease: Secondary | ICD-10-CM | POA: Diagnosis present

## 2015-12-30 DIAGNOSIS — I25119 Atherosclerotic heart disease of native coronary artery with unspecified angina pectoris: Secondary | ICD-10-CM | POA: Diagnosis not present

## 2015-12-30 DIAGNOSIS — J209 Acute bronchitis, unspecified: Secondary | ICD-10-CM

## 2015-12-30 DIAGNOSIS — J111 Influenza due to unidentified influenza virus with other respiratory manifestations: Secondary | ICD-10-CM | POA: Diagnosis present

## 2015-12-30 HISTORY — DX: Chronic obstructive pulmonary disease, unspecified: J44.9

## 2015-12-30 HISTORY — PX: CARDIAC CATHETERIZATION: SHX172

## 2015-12-30 LAB — CBC
HCT: 42.4 % (ref 39.0–52.0)
Hemoglobin: 15.3 g/dL (ref 13.0–17.0)
MCH: 32.9 pg (ref 26.0–34.0)
MCHC: 36.1 g/dL — ABNORMAL HIGH (ref 30.0–36.0)
MCV: 91.2 fL (ref 78.0–100.0)
PLATELETS: 133 10*3/uL — AB (ref 150–400)
RBC: 4.65 MIL/uL (ref 4.22–5.81)
RDW: 12.4 % (ref 11.5–15.5)
WBC: 10.5 10*3/uL (ref 4.0–10.5)

## 2015-12-30 LAB — MRSA PCR SCREENING: MRSA by PCR: NEGATIVE

## 2015-12-30 LAB — COMPREHENSIVE METABOLIC PANEL
ALT: 24 U/L (ref 17–63)
ANION GAP: 13 (ref 5–15)
AST: 28 U/L (ref 15–41)
Albumin: 4.1 g/dL (ref 3.5–5.0)
Alkaline Phosphatase: 62 U/L (ref 38–126)
BUN: 9 mg/dL (ref 6–20)
CHLORIDE: 97 mmol/L — AB (ref 101–111)
CO2: 27 mmol/L (ref 22–32)
Calcium: 9.8 mg/dL (ref 8.9–10.3)
Creatinine, Ser: 0.76 mg/dL (ref 0.61–1.24)
GFR calc non Af Amer: >60 mL/min (ref >60)
Glucose, Bld: 219 mg/dL — ABNORMAL HIGH (ref 65–99)
Potassium: 3.9 mmol/L (ref 3.5–5.1)
SODIUM: 137 mmol/L (ref 135–145)
Total Bilirubin: 1 mg/dL (ref 0.3–1.2)
Total Protein: 7.3 g/dL (ref 6.5–8.1)

## 2015-12-30 LAB — CBC WITH DIFFERENTIAL/PLATELET
Basophils Absolute: 0 10*3/uL (ref 0.0–0.1)
Basophils Relative: 0 %
EOS ABS: 0.1 10*3/uL (ref 0.0–0.7)
EOS PCT: 0 %
HCT: 47.4 % (ref 39.0–52.0)
Hemoglobin: 17.1 g/dL — ABNORMAL HIGH (ref 13.0–17.0)
LYMPHS ABS: 0.8 10*3/uL (ref 0.7–4.0)
Lymphocytes Relative: 6 %
MCH: 32.9 pg (ref 26.0–34.0)
MCHC: 36.1 g/dL — AB (ref 30.0–36.0)
MCV: 91.3 fL (ref 78.0–100.0)
Monocytes Absolute: 0.8 10*3/uL (ref 0.1–1.0)
Monocytes Relative: 5 %
Neutro Abs: 12.8 10*3/uL — ABNORMAL HIGH (ref 1.7–7.7)
Neutrophils Relative %: 89 %
PLATELETS: 180 10*3/uL (ref 150–400)
RBC: 5.19 MIL/uL (ref 4.22–5.81)
RDW: 12.3 % (ref 11.5–15.5)
WBC: 14.4 10*3/uL — AB (ref 4.0–10.5)

## 2015-12-30 LAB — TROPONIN I
TROPONIN I: 4.77 ng/mL — AB (ref <0.031)
Troponin I: 1.2 ng/mL (ref <0.031)
Troponin I: 5.17 ng/mL (ref <0.031)

## 2015-12-30 LAB — POCT I-STAT 3, ART BLOOD GAS (G3+)
Acid-Base Excess: 1 mmol/L (ref 0.0–2.0)
BICARBONATE: 27.7 meq/L — AB (ref 20.0–24.0)
O2 Saturation: 94 %
PCO2 ART: 48.4 mmHg — AB (ref 35.0–45.0)
TCO2: 29 mmol/L (ref 0–100)
pH, Arterial: 7.366 (ref 7.350–7.450)
pO2, Arterial: 75 mmHg — ABNORMAL LOW (ref 80.0–100.0)

## 2015-12-30 LAB — BASIC METABOLIC PANEL
ANION GAP: 10 (ref 5–15)
BUN: 12 mg/dL (ref 6–20)
CHLORIDE: 97 mmol/L — AB (ref 101–111)
CO2: 26 mmol/L (ref 22–32)
CREATININE: 0.73 mg/dL (ref 0.61–1.24)
Calcium: 9.1 mg/dL (ref 8.9–10.3)
GFR calc non Af Amer: >60 mL/min (ref >60)
Glucose, Bld: 207 mg/dL — ABNORMAL HIGH (ref 65–99)
POTASSIUM: 3.7 mmol/L (ref 3.5–5.1)
SODIUM: 133 mmol/L — AB (ref 135–145)

## 2015-12-30 LAB — URINALYSIS, ROUTINE W REFLEX MICROSCOPIC
Bilirubin Urine: NEGATIVE
Glucose, UA: NEGATIVE mg/dL
Ketones, ur: NEGATIVE mg/dL
LEUKOCYTES UA: NEGATIVE
NITRITE: NEGATIVE
PROTEIN: 100 mg/dL — AB
SPECIFIC GRAVITY, URINE: 1.022 (ref 1.005–1.030)
pH: 5.5 (ref 5.0–8.0)

## 2015-12-30 LAB — STREP PNEUMONIAE URINARY ANTIGEN: STREP PNEUMO URINARY ANTIGEN: NEGATIVE

## 2015-12-30 LAB — INFLUENZA PANEL BY PCR (TYPE A & B)
H1N1 flu by pcr: NOT DETECTED
INFLAPCR: NEGATIVE
INFLBPCR: NEGATIVE

## 2015-12-30 LAB — PROCALCITONIN: Procalcitonin: 0.33 ng/mL

## 2015-12-30 LAB — CREATININE, SERUM
CREATININE: 0.69 mg/dL (ref 0.61–1.24)
GFR calc Af Amer: >60 mL/min (ref >60)
GFR calc non Af Amer: >60 mL/min (ref >60)

## 2015-12-30 LAB — PROTIME-INR
INR: 1.02 (ref 0.00–1.49)
PROTHROMBIN TIME: 13.6 s (ref 11.6–15.2)

## 2015-12-30 LAB — BLOOD GAS, ARTERIAL
Acid-Base Excess: 2.2 mmol/L — ABNORMAL HIGH (ref 0.0–2.0)
BICARBONATE: 26.4 meq/L — AB (ref 20.0–24.0)
FIO2: 0.3
O2 SAT: 93.4 %
PATIENT TEMPERATURE: 98.6
PCO2 ART: 42.1 mmHg (ref 35.0–45.0)
PH ART: 7.413 (ref 7.350–7.450)
TCO2: 27.7 mmol/L (ref 0–100)
pO2, Arterial: 69.6 mmHg — ABNORMAL LOW (ref 80.0–100.0)

## 2015-12-30 LAB — URINE MICROSCOPIC-ADD ON: BACTERIA UA: NONE SEEN

## 2015-12-30 LAB — BRAIN NATRIURETIC PEPTIDE: B Natriuretic Peptide: 86.8 pg/mL (ref 0.0–100.0)

## 2015-12-30 LAB — I-STAT TROPONIN, ED: TROPONIN I, POC: 1.2 ng/mL — AB (ref 0.00–0.08)

## 2015-12-30 LAB — HIV ANTIBODY (ROUTINE TESTING W REFLEX): HIV Screen 4th Generation wRfx: NONREACTIVE

## 2015-12-30 LAB — LACTIC ACID, PLASMA
Lactic Acid, Venous: 1.3 mmol/L (ref 0.5–2.0)
Lactic Acid, Venous: 1.9 mmol/L (ref 0.5–2.0)

## 2015-12-30 SURGERY — LEFT HEART CATH AND CORONARY ANGIOGRAPHY
Anesthesia: LOCAL

## 2015-12-30 MED ORDER — BUDESONIDE 0.5 MG/2ML IN SUSP
0.5000 mg | Freq: Two times a day (BID) | RESPIRATORY_TRACT | Status: DC
  Administered 2015-12-30 – 2016-01-03 (×9): 0.5 mg via RESPIRATORY_TRACT
  Filled 2015-12-30 (×9): qty 2

## 2015-12-30 MED ORDER — CILOSTAZOL 100 MG PO TABS
100.0000 mg | ORAL_TABLET | Freq: Two times a day (BID) | ORAL | Status: DC
  Administered 2015-12-30 – 2016-01-04 (×10): 100 mg via ORAL
  Filled 2015-12-30 (×13): qty 1

## 2015-12-30 MED ORDER — RAMIPRIL 10 MG PO CAPS
10.0000 mg | ORAL_CAPSULE | Freq: Every day | ORAL | Status: DC
Start: 2015-12-30 — End: 2015-12-30
  Administered 2015-12-30: 10 mg via ORAL
  Filled 2015-12-30: qty 1

## 2015-12-30 MED ORDER — BENZONATATE 100 MG PO CAPS: 100.0000 mg | ORAL_CAPSULE | Freq: Three times a day (TID) | ORAL | Status: DC | PRN

## 2015-12-30 MED ORDER — LORAZEPAM 2 MG/ML IJ SOLN
2.0000 mg | Freq: Once | INTRAMUSCULAR | Status: AC
  Administered 2015-12-30: 2 mg via INTRAVENOUS

## 2015-12-30 MED ORDER — HEPARIN SODIUM (PORCINE) 5000 UNIT/ML IJ SOLN
5000.0000 [IU] | Freq: Three times a day (TID) | INTRAMUSCULAR | Status: DC
  Administered 2015-12-30 – 2016-01-01 (×7): 5000 [IU] via SUBCUTANEOUS
  Filled 2015-12-30 (×8): qty 1

## 2015-12-30 MED ORDER — IPRATROPIUM-ALBUTEROL 0.5-2.5 (3) MG/3ML IN SOLN
3.0000 mL | Freq: Four times a day (QID) | RESPIRATORY_TRACT | Status: DC
Start: 2015-12-30 — End: 2015-12-30
  Administered 2015-12-30 (×2): 3 mL via RESPIRATORY_TRACT
  Filled 2015-12-30 (×2): qty 3

## 2015-12-30 MED ORDER — LIDOCAINE HCL (PF) 1 % IJ SOLN
INTRAMUSCULAR | Status: AC
  Filled 2015-12-30: qty 30

## 2015-12-30 MED ORDER — LEVOFLOXACIN IN D5W 750 MG/150ML IV SOLN
750.0000 mg | INTRAVENOUS | Status: DC
  Administered 2015-12-30 – 2016-01-02 (×4): 750 mg via INTRAVENOUS
  Filled 2015-12-30 (×5): qty 150

## 2015-12-30 MED ORDER — DM-GUAIFENESIN ER 30-600 MG PO TB12
1.0000 | ORAL_TABLET | Freq: Two times a day (BID) | ORAL | Status: DC
  Administered 2015-12-31 – 2016-01-04 (×7): 1 via ORAL
  Filled 2015-12-30 (×10): qty 1

## 2015-12-30 MED ORDER — SODIUM CHLORIDE 0.9 % WEIGHT BASED INFUSION
1.0000 mL/kg/h | INTRAVENOUS | Status: DC
  Administered 2015-12-30: 1 mL/kg/h via INTRAVENOUS

## 2015-12-30 MED ORDER — IOHEXOL 350 MG/ML SOLN
INTRAVENOUS | Status: DC | PRN
  Administered 2015-12-30: 95 mL via INTRA_ARTERIAL

## 2015-12-30 MED ORDER — CLOPIDOGREL BISULFATE 75 MG PO TABS
75.0000 mg | ORAL_TABLET | Freq: Every day | ORAL | Status: DC
  Administered 2015-12-30 – 2016-01-04 (×6): 75 mg via ORAL
  Filled 2015-12-30 (×7): qty 1

## 2015-12-30 MED ORDER — CETYLPYRIDINIUM CHLORIDE 0.05 % MT LIQD
7.0000 mL | Freq: Two times a day (BID) | OROMUCOSAL | Status: DC
  Administered 2015-12-30 – 2016-01-04 (×8): 7 mL via OROMUCOSAL

## 2015-12-30 MED ORDER — ALBUTEROL SULFATE (2.5 MG/3ML) 0.083% IN NEBU
INHALATION_SOLUTION | RESPIRATORY_TRACT | Status: AC
  Filled 2015-12-30: qty 6

## 2015-12-30 MED ORDER — IPRATROPIUM-ALBUTEROL 0.5-2.5 (3) MG/3ML IN SOLN
3.0000 mL | RESPIRATORY_TRACT | Status: DC
  Administered 2015-12-30 – 2016-01-01 (×10): 3 mL via RESPIRATORY_TRACT
  Filled 2015-12-30 (×10): qty 3

## 2015-12-30 MED ORDER — ALBUTEROL SULFATE (2.5 MG/3ML) 0.083% IN NEBU
INHALATION_SOLUTION | RESPIRATORY_TRACT | Status: AC
  Administered 2015-12-30: 2.5 mg via RESPIRATORY_TRACT
  Filled 2015-12-30: qty 3

## 2015-12-30 MED ORDER — FENTANYL CITRATE (PF) 100 MCG/2ML IJ SOLN
INTRAMUSCULAR | Status: DC | PRN
  Administered 2015-12-30: 50 ug via INTRAVENOUS

## 2015-12-30 MED ORDER — HEPARIN (PORCINE) IN NACL 100-0.45 UNIT/ML-% IJ SOLN
800.0000 [IU]/h | INTRAMUSCULAR | Status: DC
  Administered 2015-12-30: 800 [IU]/h via INTRAVENOUS
  Filled 2015-12-30: qty 250

## 2015-12-30 MED ORDER — ARFORMOTEROL TARTRATE 15 MCG/2ML IN NEBU: 15.0000 ug | INHALATION_SOLUTION | Freq: Two times a day (BID) | RESPIRATORY_TRACT | Status: DC

## 2015-12-30 MED ORDER — SODIUM CHLORIDE 0.9 % IV SOLN: 250.0000 mL | INTRAVENOUS | Status: DC | PRN

## 2015-12-30 MED ORDER — SODIUM CHLORIDE 0.9 % IJ SOLN: 3.0000 mL | INTRAMUSCULAR | Status: DC | PRN

## 2015-12-30 MED ORDER — SODIUM CHLORIDE 0.9 % IV BOLUS (SEPSIS): 1000.0000 mL | INTRAVENOUS | Status: DC

## 2015-12-30 MED ORDER — LORAZEPAM 2 MG/ML IJ SOLN
INTRAMUSCULAR | Status: AC
  Administered 2015-12-30: 2 mg via INTRAVENOUS
  Filled 2015-12-30: qty 1

## 2015-12-30 MED ORDER — FUROSEMIDE 10 MG/ML IJ SOLN: 80.0000 mg | Freq: Once | INTRAMUSCULAR | Status: AC

## 2015-12-30 MED ORDER — ONDANSETRON HCL 4 MG/2ML IJ SOLN: 4.0000 mg | Freq: Four times a day (QID) | INTRAMUSCULAR | Status: DC | PRN

## 2015-12-30 MED ORDER — METHYLPREDNISOLONE SODIUM SUCC 125 MG IJ SOLR
125.0000 mg | Freq: Once | INTRAMUSCULAR | Status: AC
  Administered 2015-12-30: 125 mg via INTRAVENOUS
  Filled 2015-12-30: qty 2

## 2015-12-30 MED ORDER — METHYLPREDNISOLONE SODIUM SUCC 125 MG IJ SOLR
60.0000 mg | INTRAMUSCULAR | Status: DC
  Administered 2015-12-30: 60 mg via INTRAVENOUS
  Filled 2015-12-30: qty 2

## 2015-12-30 MED ORDER — LIDOCAINE HCL (PF) 1 % IJ SOLN
INTRAMUSCULAR | Status: DC | PRN
  Administered 2015-12-30: 03:00:00

## 2015-12-30 MED ORDER — METHYLPREDNISOLONE SODIUM SUCC 40 MG IJ SOLR
20.0000 mg | Freq: Four times a day (QID) | INTRAMUSCULAR | Status: DC
  Administered 2015-12-30 – 2016-01-01 (×7): 20 mg via INTRAVENOUS
  Filled 2015-12-30 (×7): qty 1

## 2015-12-30 MED ORDER — VANCOMYCIN HCL IN DEXTROSE 1-5 GM/200ML-% IV SOLN: 1000.0000 mg | Freq: Once | INTRAVENOUS | Status: DC

## 2015-12-30 MED ORDER — VITAMIN D (ERGOCALCIFEROL) 1.25 MG (50000 UNIT) PO CAPS
50000.0000 [IU] | ORAL_CAPSULE | ORAL | Status: DC
  Filled 2015-12-30: qty 1

## 2015-12-30 MED ORDER — ARFORMOTEROL TARTRATE 15 MCG/2ML IN NEBU
15.0000 ug | INHALATION_SOLUTION | Freq: Two times a day (BID) | RESPIRATORY_TRACT | Status: DC
  Administered 2015-12-30 – 2016-01-03 (×9): 15 ug via RESPIRATORY_TRACT
  Filled 2015-12-30 (×9): qty 2

## 2015-12-30 MED ORDER — ATORVASTATIN CALCIUM 40 MG PO TABS
40.0000 mg | ORAL_TABLET | Freq: Every day | ORAL | Status: DC
  Administered 2015-12-31 – 2016-01-03 (×4): 40 mg via ORAL
  Filled 2015-12-30 (×5): qty 1

## 2015-12-30 MED ORDER — SODIUM CHLORIDE 0.9 % IV SOLN
INTRAVENOUS | Status: DC
  Administered 2015-12-30 – 2015-12-31 (×2): via INTRAVENOUS

## 2015-12-30 MED ORDER — MORPHINE SULFATE (PF) 2 MG/ML IV SOLN: 1.0000 mg | INTRAVENOUS | Status: DC | PRN

## 2015-12-30 MED ORDER — HEPARIN (PORCINE) IN NACL 2-0.9 UNIT/ML-% IJ SOLN
INTRAMUSCULAR | Status: AC
  Filled 2015-12-30: qty 500

## 2015-12-30 MED ORDER — SODIUM CHLORIDE 0.9 % IV SOLN: INTRAVENOUS | Status: DC

## 2015-12-30 MED ORDER — MORPHINE SULFATE (PF) 2 MG/ML IV SOLN
2.0000 mg | Freq: Once | INTRAVENOUS | Status: AC
  Administered 2015-12-30: 2 mg via INTRAVENOUS

## 2015-12-30 MED ORDER — MORPHINE SULFATE (PF) 2 MG/ML IV SOLN
INTRAVENOUS | Status: AC
  Filled 2015-12-30: qty 1

## 2015-12-30 MED ORDER — IPRATROPIUM-ALBUTEROL 0.5-2.5 (3) MG/3ML IN SOLN: 3.0000 mL | RESPIRATORY_TRACT | Status: DC | PRN

## 2015-12-30 MED ORDER — OSELTAMIVIR PHOSPHATE 75 MG PO CAPS
75.0000 mg | ORAL_CAPSULE | Freq: Two times a day (BID) | ORAL | Status: DC
Start: 2015-12-30 — End: 2015-12-30
  Administered 2015-12-30: 75 mg via ORAL
  Filled 2015-12-30 (×2): qty 1

## 2015-12-30 MED ORDER — VANCOMYCIN HCL IN DEXTROSE 750-5 MG/150ML-% IV SOLN
750.0000 mg | Freq: Two times a day (BID) | INTRAVENOUS | Status: DC
  Administered 2015-12-30 (×2): 750 mg via INTRAVENOUS
  Filled 2015-12-30 (×3): qty 150

## 2015-12-30 MED ORDER — ALBUTEROL SULFATE (2.5 MG/3ML) 0.083% IN NEBU
2.5000 mg | INHALATION_SOLUTION | Freq: Once | RESPIRATORY_TRACT | Status: AC
  Administered 2015-12-30: 2.5 mg via RESPIRATORY_TRACT

## 2015-12-30 MED ORDER — LEVOFLOXACIN IN D5W 750 MG/150ML IV SOLN: 750.0000 mg | Freq: Once | INTRAVENOUS | Status: DC

## 2015-12-30 MED ORDER — INFLUENZA VAC SPLIT QUAD 0.5 ML IM SUSY
0.5000 mL | PREFILLED_SYRINGE | INTRAMUSCULAR | Status: DC
  Filled 2015-12-30 (×2): qty 0.5

## 2015-12-30 MED ORDER — SODIUM CHLORIDE 0.9 % IJ SOLN
3.0000 mL | Freq: Two times a day (BID) | INTRAMUSCULAR | Status: DC
  Administered 2015-12-30: 3 mL via INTRAVENOUS

## 2015-12-30 MED ORDER — FENTANYL CITRATE (PF) 100 MCG/2ML IJ SOLN
INTRAMUSCULAR | Status: AC
  Filled 2015-12-30: qty 2

## 2015-12-30 MED ORDER — ALBUTEROL SULFATE (2.5 MG/3ML) 0.083% IN NEBU
5.0000 mg | INHALATION_SOLUTION | Freq: Once | RESPIRATORY_TRACT | Status: AC
Start: 2015-12-30 — End: 2015-12-30
  Administered 2015-12-30: 5 mg via RESPIRATORY_TRACT

## 2015-12-30 MED ORDER — FUROSEMIDE 10 MG/ML IJ SOLN: 80.0000 mg | Freq: Once | INTRAMUSCULAR | Status: DC

## 2015-12-30 MED ORDER — HEPARIN BOLUS VIA INFUSION
4000.0000 [IU] | Freq: Once | INTRAVENOUS | Status: AC
  Administered 2015-12-30: 4000 [IU] via INTRAVENOUS
  Filled 2015-12-30: qty 4000

## 2015-12-30 MED ORDER — FUROSEMIDE 10 MG/ML IJ SOLN
INTRAMUSCULAR | Status: AC
  Administered 2015-12-30: 80 mg
  Filled 2015-12-30: qty 8

## 2015-12-30 MED ORDER — ACETAMINOPHEN 325 MG PO TABS: 650.0000 mg | ORAL_TABLET | ORAL | Status: DC | PRN

## 2015-12-30 SURGICAL SUPPLY — 13 items
CATH INFINITI 5FR JL4 (CATHETERS) ×1 IMPLANT
CATH INFINITI 5FR MPB2 (CATHETERS) ×1 IMPLANT
CATH INFINITI JR4 5F (CATHETERS) ×1 IMPLANT
GLIDESHEATH SLEND A-KIT 6F 20G (SHEATH) ×1 IMPLANT
KIT ENCORE 26 ADVANTAGE (KITS) ×1 IMPLANT
KIT HEART LEFT (KITS) ×2 IMPLANT
PACK CARDIAC CATHETERIZATION (CUSTOM PROCEDURE TRAY) ×2 IMPLANT
SHEATH PINNACLE 6F 10CM (SHEATH) ×1 IMPLANT
TRANSDUCER W/STOPCOCK (MISCELLANEOUS) ×2 IMPLANT
TUBING CIL FLEX 10 FLL-RA (TUBING) ×2 IMPLANT
WIRE EMERALD 3MM-J .035X150CM (WIRE) ×1 IMPLANT
WIRE HI TORQ VERSACORE-J 145CM (WIRE) ×1 IMPLANT
WIRE SAFE-T 1.5MM-J .035X260CM (WIRE) ×1 IMPLANT

## 2015-12-30 NOTE — ED Notes (Signed)
Pt transported to the cath lab

## 2015-12-30 NOTE — Progress Notes (Signed)
[place] TEAM 1 - Stepdown/ICU TEAM Progress Note  Patrick [name] [id number] DOB: [birth year] DOA: 12/30/2015 PCP: No primary care provider on file.  Admit HPI / Brief Narrative: Patrick [name] is a 69 y.o. WM PMHx HTN, HLD, COPD, CAD with chronically occluded RCA.   Patient presents to the ED at [facility] with c/o worsening SOB and chest pain onset this morning. He reports associated productive cough. His room mate is sick with "flu" and has just spent most of the earlier evening at the [facility] ED.  Work up in ED is initially suspicious for STEMI, patient is taken to the cath lab initially by Dr. Einar Gip for [facility]. LHC demonstrates only the chronically occluded RCA, as well as elevated LVEDP.  CXR demonstrates pulmonary vascular congestion, and LLL infiltrate suspicious of PNA.   HPI/Subjective: 12/31  A/O 4, states continues to smoke. States not on home O2. Roommate with flu.  Assessment/Plan: Sepsis/COPD Exacerbation/LLL CAP -Meets criteria on admission for SIRS; HR> 90, RR> 20 -Complete full 7-day course of levofloxacin -Solu-Medrol 60 mg daily -DuoNeb QID -Brovana neb BID -Mucinex DM BID -Continue empiric Tamiflu -Normal saline 61ml/hr -Titrate O2 to maintain SPO2> [id number]  Acute systolic CHF -Most likely multifactorial to include sepsis/COPD exacerbation, tobacco abuse. S/P LHC cardiology believes negative acute MI. -Strict in and out -Daily a.m. weight  Demand ischemia -12/31 S/P Left heart cath showing EF 40-45% ;  -See acute systolic CHF rdiology most likely demand ischemia instead of acute MI  Tobacco abuse -Discussed at length with patient concerning sequela of continuing to smoke to include emphysema, lung cancer, MI, stroke, death -Patient states would like to stop will discuss options once more stable.    Code Status: FULL Family Communication: no family present at time of exam Disposition Plan: Resolution COPD exacerbation    Consultants: Dr. Einar Gip  cardiology    Procedure/Significant Events: 12/31 left heart cath;. -Occluded mid RCA stent placed in 2007, the anatomy of occluded RCA is known from 2011 with bridging collateral and also onto lateral collaterals. -Mild disease left coronary arteries.- Collaterals to right coronary artery. - Mild diffuse global hypokinesis of the left ventricle, EF around 40-45%,     Culture 12/31 MRSA by PCR negative 12/31 blood pending 12/31 influenza A/B/H1N1 negative 12/31 strep pneumo urine antigen negative 12/31 Legionella urine antigen pending 12/31 respiratory virus panel pending 12/31 HIV negative    Antibiotics: Levofloxacin 12/31>> Vancomycin 12/31>> 12/31  DVT prophylaxis: Heparin   Devices    LINES / TUBES:      Continuous Infusions:   Objective: VITAL SIGNS: Temp: 98.7 F (37.1 C) (12/31 1600) Temp Source: Oral (12/31 1600) BP: 119/80 mmHg (12/31 1930) Pulse Rate: 129 (12/31 1930) SPO2; FIO2:   Intake/Output Summary (Last 24 hours) at 12/30/15 1954 Last data filed at 12/30/15 1900  Gross per 24 hour  Intake 1304.56 ml  Output    700 ml  Net 604.56 ml     Exam: General: A/O 4, positive acute respiratory distress Eyes: Negative headache, negative scleral hemorrhage ENT: Negative Runny nose, negative ear pain, negative gingival bleeding, Neck:  Negative scars, masses, torticollis, lymphadenopathy, JVD Lungs: diffuse poor air movement, diffuse inspiratory/expiratory wheeze, negative rhonchi, negative crackles  Cardiovascular: Regular rate and rhythm without murmur gallop or rub normal S1 and S2 Abdomen:negative abdominal pain, nondistended, positive soft, bowel sounds, no rebound, no ascites, no appreciable mass Extremities: No significant cyanosis, clubbing, or edema bilateral lower extremities Psychiatric:  Negative depression, negative anxiety, negative fatigue, negative  mania  Neurologic:  Cranial nerves II through XII intact, tongue/uvula midline,  all extremities muscle strength 5/5, sensation intact throughout, negative dysarthria, negative expressive aphasia, negative receptive aphasia.   Data Reviewed: Basic Metabolic Panel:  Recent Labs Lab 12/30/15 0122 12/30/15 0506 12/30/15 1308  NA 137  --  133*  K 3.9  --  3.7  CL 97*  --  97*  CO2 27  --  26  GLUCOSE 219*  --  207*  BUN 9  --  12  CREATININE 0.76 0.69 0.73  CALCIUM 9.8  --  9.1   Liver Function Tests:  Recent Labs Lab 12/30/15 0122  AST 28  ALT 24  ALKPHOS 62  BILITOT 1.0  PROT 7.3  ALBUMIN 4.1   No results for input(s): LIPASE, AMYLASE in the last 168 hours. No results for input(s): AMMONIA in the last 168 hours. CBC:  Recent Labs Lab 12/30/15 0122 12/30/15 0506  WBC 14.4* 10.5  NEUTROABS 12.8*  --   HGB 17.1* 15.3  HCT 47.4 42.4  MCV 91.3 91.2  PLT 180 133*   Cardiac Enzymes:  Recent Labs Lab 12/30/15 0506 12/30/15 0905 12/30/15 1553  TROPONINI 1.20* 4.77* 5.17*   BNP (last 3 results)  Recent Labs  12/30/15 0145  BNP 86.8    ProBNP (last 3 results) No results for input(s): PROBNP in the last 8760 hours.  CBG: No results for input(s): GLUCAP in the last 168 hours.  Recent Results (from the past 240 hour(s))  MRSA PCR Screening     Status: None   Collection Time: 12/30/15  3:38 AM  Result Value Ref Range Status   MRSA by PCR NEGATIVE NEGATIVE Final    Comment:        The GeneXpert MRSA Assay (FDA approved for NASAL specimens only), is one component of a comprehensive MRSA colonization surveillance program. It is not intended to diagnose MRSA infection nor to guide or monitor treatment for MRSA infections.      Studies:  Recent x-ray studies have been reviewed in detail by the Attending Physician  Scheduled Meds:  Scheduled Meds: . antiseptic oral rinse  7 mL Mouth Rinse BID  . arformoterol  15 mcg Nebulization BID  . atorvastatin  40 mg Oral q1800  . budesonide (PULMICORT) nebulizer solution  0.5 mg  Nebulization BID  . cilostazol  100 mg Oral BID  . clopidogrel  75 mg Oral Daily  . dextromethorphan-guaiFENesin  1 tablet Oral BID  . furosemide  80 mg Intravenous Once  . heparin  5,000 Units Subcutaneous 3 times per day  . [START ON 12/31/2015] Influenza vac split quadrivalent PF  0.5 mL Intramuscular Tomorrow-1000  . ipratropium-albuterol  3 mL Nebulization Q4H  . levofloxacin (LEVAQUIN) IV  750 mg Intravenous Q24H  . [START ON 12/31/2015] methylPREDNISolone (SOLU-MEDROL) injection  20 mg Intravenous Q6H  . ramipril  10 mg Oral Daily  . sodium chloride  3 mL Intravenous Q12H  . Vitamin D (Ergocalciferol)  50,000 Units Oral Q7 days    Time spent on care of this patient: 40 mins   WOODS, Geraldo Docker , MD  [facility] Office  [phone] Pager - [phone]  On-Call/Text Page:      [email or web address]      password [email or web address]  If 7PM-7AM, please contact night-coverage [email or web address] Password [email or web address] 12/30/2015, 7:54 PM   LOS: 0 days   Care during the described time interval was provided by me .  I have reviewed this patient's available data,  including medical history, events of note, physical examination, and all test results as part of my evaluation. I have personally reviewed and interpreted all radiology studies.   Dia Crawford, MD [phone] Pager

## 2015-12-30 NOTE — ED Notes (Signed)
Portable x-ray in room 

## 2015-12-30 NOTE — H&P (Signed)
Patrick [name] is an 69 y.o. male.   Chief Complaint: Shortness of breath HPI: Patrick [name]  is a 69 y.o. male  With history of peripheral arterial disease and right common femoral artery endarterectomy and right profunda femoral endarterectomy sometime in 2007 for an occluded proximal/flush occluded right SFA, tobacco use disorder, COPD and history of RCA stenting in 2007, presents with worsening shortness of breath and dyspnea on exertion ongoing for the past 2 days. He also felt slightly feverish for the past 48 hours.  He states that his roommate was diagnosed with "flu" and is not committed to [facility] long hospital. He stayed in the emergency room on evening last night and since then has not felt well with worsening dyspnea on exertion and also complains of mild chest tightness. Due to marked dyspnea he presented to the emergency room this evening although has been having symptoms since last night. He has chronic cough but mostly dry cough. He also has chronic dyspnea but has been worse in the past 2 days.  Past Medical History  Diagnosis Date  . Hyperlipidemia   . Hypertension   . CAD (coronary artery disease)   . COPD (chronic obstructive pulmonary disease) ([place])     History reviewed. No pertinent past surgical history.  Family History  Problem Relation Age of Onset  . Cancer Mother   . Cancer Father   . Diabetes Sister   . Diabetes Brother    Social History:  reports that he has been smoking.  He does not have any smokeless tobacco history on file. He reports that he does not drink alcohol or use illicit drugs.  Allergies:  Allergies  Allergen Reactions  . Aspirin Swelling  . Cephalexin Itching  . Ibuprofen Swelling    Review of Systems - Negative except Feeling mildly feverish, shortness of breath, cough, mild chest discomfort. Denies symptoms of claudication. No symptoms of neurologic deficits.  Blood pressure 120/69, pulse 141, resp. rate 27, height 5' 8"  (1.727 m),  weight 61.689 kg (136 lb), SpO2 98 %. General appearance: alert, cooperative, appears stated age and no distress Eyes: negative findings: lids and lashes normal Neck: no adenopathy, no carotid bruit, no JVD, supple, symmetrical, trachea midline and thyroid not enlarged, symmetric, no tenderness/mass/nodules Neck: JVP - normal, carotids 2+= without bruits Resp: Barrel-shaped chest, bilateral expiratory wheezing present. Decreased breath sounds due to emphysematous chest. Chest wall: no tenderness Cardio: regular rate and rhythm, S1, S2 normal, no murmur, click, rub or gallop and Distant heart sounds. GI: soft, non-tender; bowel sounds normal; no masses,  no organomegaly Extremities: extremities normal, atraumatic, no cyanosis or edema Pulses: Carotid pulses 2+ without bruit, radial pulses normal, femoral pulses 2+, popliteal pulse and pedal pulse absent bilaterally. No evidence of acute arterial insufficiency. Skin: Skin color, texture, turgor normal. No rashes or lesions Neurologic: Grossly normal  Results for orders placed or performed during the hospital encounter of 12/30/15 (from the past 48 hour(s))  CBC with Differential     Status: Abnormal   Collection Time: 12/30/15  1:22 AM  Result Value Ref Range   WBC 14.4 (H) 4.0 - 10.5 K/uL   RBC 5.19 4.22 - 5.81 MIL/uL   Hemoglobin 17.1 (H) 13.0 - 17.0 g/dL   HCT 47.4 39.0 - 52.0 %   MCV 91.3 78.0 - 100.0 fL   MCH 32.9 26.0 - 34.0 pg   MCHC 36.1 (H) 30.0 - 36.0 g/dL   RDW 12.3 11.5 - 15.5 %  Platelets 180 150 - 400 K/uL   Neutrophils Relative % 89 %   Neutro Abs 12.8 (H) 1.7 - 7.7 K/uL   Lymphocytes Relative 6 %   Lymphs Abs 0.8 0.7 - 4.0 K/uL   Monocytes Relative 5 %   Monocytes Absolute 0.8 0.1 - 1.0 K/uL   Eosinophils Relative 0 %   Eosinophils Absolute 0.1 0.0 - 0.7 K/uL   Basophils Relative 0 %   Basophils Absolute 0.0 0.0 - 0.1 K/uL  Comprehensive metabolic panel     Status: Abnormal   Collection Time: 12/30/15  1:22 AM   Result Value Ref Range   Sodium 137 135 - 145 mmol/L   Potassium 3.9 3.5 - 5.1 mmol/L   Chloride 97 (L) 101 - 111 mmol/L   CO2 27 22 - 32 mmol/L   Glucose, Bld 219 (H) 65 - 99 mg/dL   BUN 9 6 - 20 mg/dL   Creatinine, Ser 0.76 0.61 - 1.24 mg/dL   Calcium 9.8 8.9 - 10.3 mg/dL   Total Protein 7.3 6.5 - 8.1 g/dL   Albumin 4.1 3.5 - 5.0 g/dL   AST 28 15 - 41 U/L   ALT 24 17 - 63 U/L   Alkaline Phosphatase 62 38 - 126 U/L   Total Bilirubin 1.0 0.3 - 1.2 mg/dL   GFR calc non Af Amer >60 >60 mL/min   GFR calc Af Amer >60 >60 mL/min    Comment: (NOTE) The eGFR has been calculated using the CKD EPI equation. This calculation has not been validated in all clinical situations. eGFR's persistently <60 mL/min signify possible Chronic Kidney Disease.    Anion gap 13 5 - 15  I-Stat Troponin, ED (not at [facility])     Status: Abnormal   Collection Time: 12/30/15  1:29 AM  Result Value Ref Range   Troponin i, poc 1.20 (HH) 0.00 - 0.08 ng/mL   Comment NOTIFIED PHYSICIAN    Comment 3            Comment: Due to the release kinetics of cTnI, a negative result within the first hours of the onset of symptoms does not rule out myocardial infarction with certainty. If myocardial infarction is still suspected, repeat the test at appropriate intervals.    No results found.  Labs:   Lab Results  Component Value Date   WBC 14.4* 12/30/2015   HGB 17.1* 12/30/2015   HCT 47.4 12/30/2015   MCV 91.3 12/30/2015   PLT 180 12/30/2015    Recent Labs Lab 12/30/15 0122  NA 137  K 3.9  CL 97*  CO2 27  BUN 9  CREATININE 0.76  CALCIUM 9.8  PROT 7.3  BILITOT 1.0  ALKPHOS 62  ALT 24  AST 28  GLUCOSE 219*   EKG:  12/30/2015: Sinus tachycardia, normal axis, early repolarization versus ST elevation in anterior leads with ST depression in inferior and lateral leads. Cannot exclude acute injury pattern.   Current facility-administered medications:  .  heparin ADULT infusion 100 units/mL (25000  units/250 mL), 800 Units/hr, Intravenous, Continuous, Erenest Blank, RPH, Last Rate: 8 mL/hr at 12/30/15 0158, 800 Units/hr at 12/30/15 0158  Current outpatient prescriptions:  .  atorvastatin (LIPITOR) 40 MG tablet, Take 40 mg by mouth daily., Disp: , Rfl:  .  azithromycin (ZITHROMAX) 250 MG tablet, Take 2 tabs PO x 1 dose, then 1 tab PO QD x 4 days, Disp: 6 tablet, Rfl: 0 .  benzonatate (TESSALON) 100 MG capsule, Take 1-2 capsules (  100-200 mg total) by mouth 3 (three) times daily as needed for cough., Disp: 40 capsule, Rfl: 0 .  carvedilol (COREG) 3.125 MG tablet, Take 3.125 mg by mouth 2 (two) times daily with a meal., Disp: , Rfl:  .  cilostazol (PLETAL) 100 MG tablet, Take 100 mg by mouth 2 (two) times daily., Disp: , Rfl:  .  clopidogrel (PLAVIX) 75 MG tablet, Take 75 mg by mouth daily., Disp: , Rfl:  .  hydrochlorothiazide (HYDRODIURIL) 12.5 MG tablet, Take 12.5 mg by mouth daily., Disp: , Rfl:  .  ramipril (ALTACE) 10 MG capsule, Take 10 mg by mouth daily., Disp: , Rfl:  .  Vitamin D, Ergocalciferol, (DRISDOL) 50000 UNITS CAPS capsule, Take 50,000 Units by mouth every 7 (seven) days., Disp: , Rfl:   Assessment/Plan  1. Abnormal EKG suggestive of acute anterior injury pattern 2. Shortness of breath and dyspnea on exertion probably due to underlying COPD, cannot exclude acute exacerbation. Mildly elevated white count. 3. Peripheral arterial disease with history of right femoral artery endarterectomy. 4. History of known coronary artery disease with angioplasty and stenting to the mid RCA with a 3.5 mm stent in 2007.   Recommendation: Patient will be emergently taken to the cardiac catheterization lab to evaluate his coronary anatomy.  very difficult situation, patient is severely tachycardic with heart rate between 120-1 40 bpm. Will make further recommendations after cardiac catheterization.    Adrian Prows, MD 12/30/2015, 2:26 AM [facility]. [place] Pager:  [phone] Office: [phone] If no answer: Cell:  [phone]

## 2015-12-30 NOTE — ED Notes (Signed)
Pt. reports worsening SOB with productive cough - increases with exertion onset this morning , denies fever or chills.

## 2015-12-30 NOTE — ED Notes (Signed)
Zoll pads on pt.

## 2015-12-30 NOTE — ED Notes (Signed)
[facility] contacted to page Code Stemi

## 2015-12-30 NOTE — Progress Notes (Signed)
[facility] Physician-Brief Progress Note Patient Name: Patrick [name] DOB: 1946-01-06 MRN: [id number]   Date of Service  12/30/2015  HPI/Events of Note  Worsening resp failure in pt on ACEi with suspected aecopd/? Viral syndrome but no fever and ok cath except lvedp 22 `  eICU Interventions  pccm service fellow to evaulate     Intervention Category Major Interventions: Respiratory failure - evaluation and management  Christinia Gully 12/30/2015, 7:07 PM

## 2015-12-30 NOTE — ED Provider Notes (Signed)
CSN: [id number]     Arrival date & time 12/30/15  0114 History  By signing my name below, I, Irene Pap, attest that this documentation has been prepared under the direction and in the presence of Allie Bossier, MD. Electronically Signed: Irene Pap, ED Scribe. 12/31/2015. 9:06 AM.  Chief Complaint  Patient presents with  . Shortness of Breath   The history is provided by the patient. No language interpreter was used.  HPI Comments: Patrick [name] is a 69 y.o. male with a hx of HTN, CAD, and COPD who presents to the Emergency Department complaining of gradually worsening SOB and central chest discomfort onset earlier this morning. Pt reports associated productive cough. He reports hx of similar symptoms but states that it has never been this bad before. He says that this does not feel like a COPD flare up. He denies unexpected weight gain, leg swelling, recent surgeries, hx of blood clots. Pt states that he has stents placed and takes blood thinners. He reports that Dr. Nadyne Coombes is his cardiologist. Pt is allergic to aspirin and ibuprofen.   Past Medical History  Diagnosis Date  . Hyperlipidemia   . Hypertension   . CAD (coronary artery disease)   . COPD (chronic obstructive pulmonary disease) ([place])    History reviewed. No pertinent past surgical history. Family History  Problem Relation Age of Onset  . Cancer Mother   . Cancer Father   . Diabetes Sister   . Diabetes Brother    Social History  Substance Use Topics  . Smoking status: Current Every Day Smoker  . Smokeless tobacco: None  . Alcohol Use: No    Review of Systems  Constitutional: Negative for unexpected weight change.  Respiratory: Positive for cough and shortness of breath.   Cardiovascular: Positive for chest pain. Negative for leg swelling.  All other systems reviewed and are negative.     Allergies  Aspirin; Cephalexin; and Ibuprofen  Home Medications   Prior to Admission medications   Medication  Sig Start Date End Date Taking? Authorizing Provider  atorvastatin (LIPITOR) 40 MG tablet Take 40 mg by mouth daily.   Yes Historical Provider, MD  carvedilol (COREG) 3.125 MG tablet Take 3.125 mg by mouth 2 (two) times daily with a meal.   Yes Historical Provider, MD  cilostazol (PLETAL) 100 MG tablet Take 100 mg by mouth 2 (two) times daily.   Yes Historical Provider, MD  clopidogrel (PLAVIX) 75 MG tablet Take 75 mg by mouth daily.   Yes Historical Provider, MD  hydrochlorothiazide (HYDRODIURIL) 12.5 MG tablet Take 12.5 mg by mouth daily.   Yes Historical Provider, MD  Multiple Vitamin (MULTIVITAMIN WITH MINERALS) TABS tablet Take 1 tablet by mouth daily. Centrum Silver   Yes Historical Provider, MD  nitrofurantoin (MACRODANTIN) 100 MG capsule Take 100 mg by mouth 2 (two) times daily. 7 day course filled 12/25/15 12/25/15  Yes Historical Provider, MD  ramipril (ALTACE) 10 MG capsule Take 10 mg by mouth daily.   Yes Historical Provider, MD  Tetrahydroz-Polyvinyl Al-Povid (MURINE TEARS PLUS OP) Place 1 drop into both eyes daily.   Yes Historical Provider, MD  vitamin C (ASCORBIC ACID) 500 MG tablet Take 500 mg by mouth daily as needed (immune system boost).   Yes Historical Provider, MD  Vitamin D, Ergocalciferol, (DRISDOL) 50000 UNITS CAPS capsule Take 50,000 Units by mouth every 7 (seven) days. Wednesdays   Yes Historical Provider, MD   BP 87/51 mmHg  Pulse 100  Temp(Src) 98  F (36.7 C) (Oral)  Resp 26  Ht 5\' 8"  (1.727 m)  Wt 128 lb 4.9 oz (58.2 kg)  BMI 19.51 kg/m2  SpO2 100% Physical Exam  Constitutional: He is oriented to person, place, and time. He appears well-developed and well-nourished.  HENT:  Head: Normocephalic and atraumatic.  Mouth/Throat: Mucous membranes are dry.  Eyes: EOM are normal. Pupils are equal, round, and reactive to light.  Neck: Normal range of motion. Neck supple.  Cardiovascular: Regular rhythm and normal heart sounds.  Tachycardia present.  Exam reveals no  gallop and no friction rub.   No murmur heard. Pulmonary/Chest: He is in respiratory distress. He has wheezes.  Decreased air movement bilaterally, expiratory wheezes mild intercostal retractions, respiratory difficulty on exam  Abdominal: Soft. There is no tenderness.  Musculoskeletal: Normal range of motion.  no significant leg swelling  Neurological: He is alert and oriented to person, place, and time.  Skin: Skin is warm and dry.  Psychiatric: He has a normal mood and affect. His behavior is normal.  Nursing note and vitals reviewed.   ED Course  Procedures (including critical care time) CRITICAL CARE Performed by: Mariea Clonts   Total critical care time: 30 minutes  Critical care time was exclusive of separately billable procedures and treating other patients.  Critical care was necessary to treat or prevent imminent or life-threatening deterioration.  Critical care was time spent personally by me on the following activities: development of treatment plan with patient and/or surrogate as well as nursing, discussions with consultants, evaluation of patient's response to treatment, examination of patient, obtaining history from patient or surrogate, ordering and performing treatments and interventions, ordering and review of laboratory studies, ordering and review of radiographic studies, pulse oximetry and re-evaluation of patient's condition.  DIAGNOSTIC STUDIES: Oxygen Saturation is 91% on room air, borderline by my interpretation.    COORDINATION OF CARE: 1:34 AM-Discussed treatment plan which includes  Labs and x-ray  with pt at bedside and pt agreed to plan.   1:36 AM- significant EKG changes, SOB, and chest discomfort noted; aspirin ordered; Code STEMI called; Dr. Nadyne Coombes and interventional cardiologist called  1:45 AM- Dr. Nadyne Coombes agreed to take the pt emergently in the Cath lab  Labs Review Labs Reviewed  CBC WITH DIFFERENTIAL/PLATELET - Abnormal; Notable for the  following:    WBC 14.4 (*)    Hemoglobin 17.1 (*)    MCHC 36.1 (*)    Neutro Abs 12.8 (*)    All other components within normal limits  COMPREHENSIVE METABOLIC PANEL - Abnormal; Notable for the following:    Chloride 97 (*)    Glucose, Bld 219 (*)    All other components within normal limits  CBC - Abnormal; Notable for the following:    MCHC 36.1 (*)    Platelets 133 (*)    All other components within normal limits  TROPONIN I - Abnormal; Notable for the following:    Troponin I 1.20 (*)    All other components within normal limits  TROPONIN I - Abnormal; Notable for the following:    Troponin I 4.77 (*)    All other components within normal limits  TROPONIN I - Abnormal; Notable for the following:    Troponin I 5.17 (*)    All other components within normal limits  BASIC METABOLIC PANEL - Abnormal; Notable for the following:    Sodium 133 (*)    Chloride 97 (*)    Glucose, Bld 207 (*)  All other components within normal limits  BLOOD GAS, ARTERIAL - Abnormal; Notable for the following:    pO2, Arterial 69.6 (*)    Bicarbonate 26.4 (*)    Acid-Base Excess 2.2 (*)    All other components within normal limits  CBC - Abnormal; Notable for the following:    MCHC 36.1 (*)    Platelets 139 (*)    All other components within normal limits  URINALYSIS, ROUTINE W REFLEX MICROSCOPIC (NOT AT [facility]) - Abnormal; Notable for the following:    APPearance CLOUDY (*)    Hgb urine dipstick TRACE (*)    Protein, ur 100 (*)    All other components within normal limits  BASIC METABOLIC PANEL - Abnormal; Notable for the following:    Sodium 134 (*)    Chloride 97 (*)    Glucose, Bld 218 (*)    BUN 21 (*)    Calcium 8.7 (*)    All other components within normal limits  URINE MICROSCOPIC-ADD ON - Abnormal; Notable for the following:    Squamous Epithelial / LPF 0-5 (*)    All other components within normal limits  I-STAT TROPOININ, ED - Abnormal; Notable for the following:    Troponin  i, poc 1.20 (*)    All other components within normal limits  POCT I-STAT 3, ART BLOOD GAS (G3+) - Abnormal; Notable for the following:    pCO2 arterial 48.4 (*)    pO2, Arterial 75.0 (*)    Bicarbonate 27.7 (*)    All other components within normal limits  MRSA PCR SCREENING  CULTURE, BLOOD (ROUTINE X 2)  CULTURE, BLOOD (ROUTINE X 2)  CULTURE, EXPECTORATED SPUTUM-ASSESSMENT  GRAM STAIN  BRAIN NATRIURETIC PEPTIDE  LACTIC ACID, PLASMA  LACTIC ACID, PLASMA  PROCALCITONIN  PROTIME-INR  CREATININE, SERUM  HIV ANTIBODY (ROUTINE TESTING)  INFLUENZA PANEL BY PCR (TYPE A & B, H1N1)  STREP PNEUMONIAE URINARY ANTIGEN  MAGNESIUM  PHOSPHORUS  LEGIONELLA ANTIGEN, URINE    Imaging Review Dg Chest Port 1 View  12/30/2015  CLINICAL DATA:  Acute onset of shortness of breath. Initial encounter. EXAM: PORTABLE CHEST 1 VIEW COMPARISON:  Chest radiograph from 02/03/2013 FINDINGS: The lungs are well-aerated. Mild left basilar airspace opacity may reflect atelectasis or mild pneumonia. Mild vascular congestion is noted. There is no evidence of pleural effusion or pneumothorax. The cardiomediastinal silhouette is within normal limits. No acute osseous abnormalities are seen. IMPRESSION: Mild left basilar airspace opacity may reflect atelectasis or mild pneumonia. Mild vascular congestion noted. Electronically Signed   By: Garald Balding M.D.   On: 12/30/2015 02:31   I have personally reviewed and evaluated these images and lab results as part of my medical decision-making.   EKG Interpretation   Date/Time:  Saturday December 30 2015 01:20:27 EST Ventricular Rate:  138 PR Interval:  158 QRS Duration: 68 QT Interval:  264 QTC Calculation: 399 R Axis:   85 Text Interpretation:  Sinus tachycardia Biatrial enlargement Anteroseptal  infarct , age undetermined Abnormal ECG Confirmed by Mae Cianci  MD, Nadyne Gariepy  ([id number]) on 12/30/2015 1:27:42 AM      MDM   Final diagnoses:  SOB (shortness of breath)  ST  elevation myocardial infarction (STEMI), unspecified artery ([place])    Patient presents with shortest breath and chest discomfort worse with exertion. EKG shows significant changes mild elevation anterior with mild ST depression lateral and inferior. Clinically patient having COPD exacerbation and possible STEMI.  Discussed with Dr. Sharlyne Cai who agreed with code  STEMI and will come in with the cath team. Heparin ordered. Patient has aspirin allergy is on Plavix.  The patients results and plan were reviewed and discussed.   Any x-rays performed were independently reviewed by myself.   Differential diagnosis were considered with the presenting HPI.  Medications  clopidogrel (PLAVIX) tablet 75 mg (75 mg Oral Given 12/30/15 0943)  atorvastatin (LIPITOR) tablet 40 mg (40 mg Oral Not Given 12/30/15 1745)  cilostazol (PLETAL) tablet 100 mg (100 mg Oral Not Given 12/30/15 2200)  Vitamin D (Ergocalciferol) (DRISDOL) capsule 50,000 Units (50,000 Units Oral Not Given 12/30/15 0943)  benzonatate (TESSALON) capsule 100-200 mg (not administered)  acetaminophen (TYLENOL) tablet 650 mg (not administered)  ondansetron (ZOFRAN) injection 4 mg (not administered)  heparin injection 5,000 Units (5,000 Units Subcutaneous Given 12/31/15 0556)  levofloxacin (LEVAQUIN) IVPB 750 mg (750 mg Intravenous Given 12/31/15 0404)  antiseptic oral rinse (CPC / CETYLPYRIDINIUM CHLORIDE 0.05%) solution 7 mL (7 mLs Mouth Rinse Given 12/30/15 2200)  Influenza vac split quadrivalent PF (FLUARIX) injection 0.5 mL (not administered)  dextromethorphan-guaiFENesin (MUCINEX DM) 30-600 MG per 12 hr tablet 1 tablet (1 tablet Oral Not Given 12/30/15 1745)  arformoterol (BROVANA) nebulizer solution 15 mcg (15 mcg Nebulization Given 12/31/15 0749)  ipratropium-albuterol (DUONEB) 0.5-2.5 (3) MG/3ML nebulizer solution 3 mL (not administered)  methylPREDNISolone sodium succinate (SOLU-MEDROL) 40 mg/mL injection 20 mg (20 mg Intravenous Given 12/31/15  0556)  ipratropium-albuterol (DUONEB) 0.5-2.5 (3) MG/3ML nebulizer solution 3 mL (3 mLs Nebulization Given 12/31/15 0749)  budesonide (PULMICORT) nebulizer solution 0.5 mg (0.5 mg Nebulization Given 12/31/15 0749)  0.9 %  sodium chloride infusion ( Intravenous Started During Downtime 12/30/15 2300)  albuterol (PROVENTIL) (2.5 MG/3ML) 0.083% nebulizer solution 5 mg (5 mg Nebulization Given 12/30/15 0125)  methylPREDNISolone sodium succinate (SOLU-MEDROL) 125 mg/2 mL injection 125 mg (125 mg Intravenous Given 12/30/15 0141)  heparin bolus via infusion 4,000 Units (4,000 Units Intravenous Given 12/30/15 0158)  morphine 2 MG/ML injection 2 mg (2 mg Intravenous Given 12/30/15 1747)  albuterol (PROVENTIL) (2.5 MG/3ML) 0.083% nebulizer solution 2.5 mg (2.5 mg Nebulization Given 12/30/15 1751)  LORazepam (ATIVAN) injection 2 mg (2 mg Intravenous Given 12/30/15 1819)  furosemide (LASIX) injection 80 mg (0 mg Intravenous Duplicate [id number])  furosemide (LASIX) 10 MG/ML injection (80 mg  Given 12/30/15 1924)  magnesium sulfate IVPB 1 g 100 mL (1 g Intravenous Given 12/31/15 0449)    Filed Vitals:   12/31/15 0632 12/31/15 0700 12/31/15 0749 12/31/15 0800  BP: 99/67 111/62  87/51  Pulse: 100 101  100  Temp:    98 F (36.7 C)  TempSrc:    Oral  Resp: 20 19  26   Height:      Weight:      SpO2: 98% 97% 99% 100%    Final diagnoses:  SOB (shortness of breath)  ST elevation myocardial infarction (STEMI), unspecified artery ([place])    Admission/ observation were discussed with the admitting physician, patient and/or family and they are comfortable with the plan.    Elnora Morrison, MD 12/31/15 [phone]

## 2015-12-30 NOTE — ED Notes (Signed)
Adam (room mate): [phone] Gerald Stabs (room mate): [phone]

## 2015-12-30 NOTE — H&P (Signed)
[facility] History and Physical  CRISS MOHAMMED [id number] DOB: 12-18-1946 DOA: 12/30/2015  Referring physician: EDP PCP: No primary care provider on file.   Chief Complaint: SOB   HPI: Patrick [name] is a 69 y.o. male with h/o HTN, COPD, CAD with chronically occluded RCA.  Patient presents to the ED at [facility] with c/o worsening SOB and chest pain onset this morning.  He reports associated productive cough.  His room mate is sick with "flu" and has just spent most of the earlier evening at the [facility] ED.  Work up in ED is initially suspicious for STEMI, patient is taken to the cath lab initially by Dr. Einar Gip for [facility].  LHC demonstrates only the chronically occluded RCA, as well as elevated LVEDP.  CXR demonstrates pulmonary vascular congestion, and LLL infiltrate suspicious of PNA.  Review of Systems: Systems reviewed.  As above, otherwise negative  Past Medical History  Diagnosis Date  . Hyperlipidemia   . Hypertension   . CAD (coronary artery disease)   . COPD (chronic obstructive pulmonary disease) ([place])    History reviewed. No pertinent past surgical history. Social History:  reports that he has been smoking.  He does not have any smokeless tobacco history on file. He reports that he does not drink alcohol or use illicit drugs.  Allergies  Allergen Reactions  . Aspirin Swelling  . Cephalexin Itching  . Ibuprofen Swelling    Family History  Problem Relation Age of Onset  . Cancer Mother   . Cancer Father   . Diabetes Sister   . Diabetes Brother      Prior to Admission medications   Medication Sig Start Date End Date Taking? Authorizing Provider  atorvastatin (LIPITOR) 40 MG tablet Take 40 mg by mouth daily.    Historical Provider, MD  azithromycin (ZITHROMAX) 250 MG tablet Take 2 tabs PO x 1 dose, then 1 tab PO QD x 4 days 08/24/15   Robyn Haber, MD  benzonatate (TESSALON) 100 MG capsule Take 1-2 capsules (100-200 mg total) by mouth 3 (three) times daily as needed  for cough. 08/24/15   Robyn Haber, MD  carvedilol (COREG) 3.125 MG tablet Take 3.125 mg by mouth 2 (two) times daily with a meal.    Historical Provider, MD  cilostazol (PLETAL) 100 MG tablet Take 100 mg by mouth 2 (two) times daily.    Historical Provider, MD  clopidogrel (PLAVIX) 75 MG tablet Take 75 mg by mouth daily.    Historical Provider, MD  hydrochlorothiazide (HYDRODIURIL) 12.5 MG tablet Take 12.5 mg by mouth daily.    Historical Provider, MD  ramipril (ALTACE) 10 MG capsule Take 10 mg by mouth daily.    Historical Provider, MD  Vitamin D, Ergocalciferol, (DRISDOL) 50000 UNITS CAPS capsule Take 50,000 Units by mouth every 7 (seven) days.    Historical Provider, MD   Physical Exam: Filed Vitals:   12/30/15 0345 12/30/15 0400  BP: 101/57 100/61  Pulse: 127 126  Temp:    Resp: 31 27    BP 100/61 mmHg  Pulse 126  Temp(Src) 99.7 F (37.6 C) (Oral)  Resp 27  Ht 5\' 8"  (1.727 m)  Wt 57.6 kg (126 lb 15.8 oz)  BMI 19.31 kg/m2  SpO2 93%  General Appearance:    Alert, oriented, no distress, appears stated age  Head:    Normocephalic, atraumatic  Eyes:    PERRL, EOMI, sclera non-icteric        Nose:   Nares without drainage  or epistaxis. Mucosa, turbinates normal  Throat:   Moist mucous membranes. Oropharynx without erythema or exudate.  Neck:   Supple. No carotid bruits.  No thyromegaly.  No lymphadenopathy.   Back:     No CVA tenderness, no spinal tenderness  Lungs:     Clear to auscultation bilaterally, without wheezes, rhonchi or rales  Chest wall:    No tenderness to palpitation  Heart:    Regular rate and rhythm without murmurs, gallops, rubs  Abdomen:     Soft, non-tender, nondistended, normal bowel sounds, no organomegaly  Genitalia:    deferred  Rectal:    deferred  Extremities:   No clubbing, cyanosis or edema.  Pulses:   2+ and symmetric all extremities  Skin:   Skin color, texture, turgor normal, no rashes or lesions  Lymph nodes:   Cervical, supraclavicular,  and axillary nodes normal  Neurologic:   CNII-XII intact. Normal strength, sensation and reflexes      throughout    Labs on Admission:  Basic Metabolic Panel:  Recent Labs Lab 12/30/15 0122  NA 137  K 3.9  CL 97*  CO2 27  GLUCOSE 219*  BUN 9  CREATININE 0.76  CALCIUM 9.8   Liver Function Tests:  Recent Labs Lab 12/30/15 0122  AST 28  ALT 24  ALKPHOS 62  BILITOT 1.0  PROT 7.3  ALBUMIN 4.1   No results for input(s): LIPASE, AMYLASE in the last 168 hours. No results for input(s): AMMONIA in the last 168 hours. CBC:  Recent Labs Lab 12/30/15 0122  WBC 14.4*  NEUTROABS 12.8*  HGB 17.1*  HCT 47.4  MCV 91.3  PLT 180   Cardiac Enzymes: No results for input(s): CKTOTAL, CKMB, CKMBINDEX, TROPONINI in the last 168 hours.  BNP (last 3 results) No results for input(s): PROBNP in the last 8760 hours. CBG: No results for input(s): GLUCAP in the last 168 hours.  Radiological Exams on Admission: Dg Chest Port 1 View  12/30/2015  CLINICAL DATA:  Acute onset of shortness of breath. Initial encounter. EXAM: PORTABLE CHEST 1 VIEW COMPARISON:  Chest radiograph from 02/03/2013 FINDINGS: The lungs are well-aerated. Mild left basilar airspace opacity may reflect atelectasis or mild pneumonia. Mild vascular congestion is noted. There is no evidence of pleural effusion or pneumothorax. The cardiomediastinal silhouette is within normal limits. No acute osseous abnormalities are seen. IMPRESSION: Mild left basilar airspace opacity may reflect atelectasis or mild pneumonia. Mild vascular congestion noted. Electronically Signed   By: Garald Balding M.D.   On: 12/30/2015 02:31    EKG: Independently reviewed.  Assessment/Plan Principal Problem:   Left lower lobe pneumonia Active Problems:   Essential hypertension   Coronary atherosclerosis of native coronary artery   Shortness of breath   CAP (community acquired pneumonia)   Demand ischemia ([place])   Sepsis ([place])    Influenza-like illness   1. LLL CAP causing Sepsis, ILI - 3/4 SIRS criteria, single temperature since arrival measured at 99.7 1. Sepsis pathway with PNA cause 2. Allergy to cephalosporins: levaquin and vanc ordered 3. Holding off on IVF for now (see discussion below with Dr. Jimmy Footman regarding why) 4. Lactate pending 5. Cultures pending 6. procalcitonin pending 7. Empiric tamiflu 8. Influenza pnl pending, patient on droplet precautions 9. Strict intake and output 2. Demand ischemia secondary to #1 - 1. S/p LHC, see Dr. Irven Shelling report 2. Will check serial troponins to trend 3. HTN - holding BP meds as his BP is currently borderline (106/58)  Very  ill patient with multiple organ system involvement, high risk of decompensation in the coming hours.  Dr. Einar Gip performed L heart cath on patient as noted above, please see his cath note.  Spoke with Dr. Jimmy Footman who feels patient is clinically fluid overloaded at the moment given his pulmonary vascular congestion on CXR and elevated LVEDP on his LHC.  Despite sepsis and PNA diagnosis, she states she would recommend KVOing fluid for now, and only give additional IVF if his BP drops or if his lactate comes back elevated.  Code Status: Full  Family Communication: No family in room Disposition Plan: Admit to SDU   Time spent: 70 min  Azayla Polo M. [facility] Pager [phone]  If 7AM-7PM, please contact the day team taking care of the patient [email or web address] Password [email or web address] 12/30/2015, 4:07 AM

## 2015-12-30 NOTE — Progress Notes (Signed)
Pt remains SOB, feeling anxious, with increased WOB, sitting up and stating he feels "its hard to breathe and take a deep breath" after new orders followed. Pt still requiring more oxygen than previously to keep saturations greater than 92. MD paged to make aware. Md to bedside.

## 2015-12-30 NOTE — ED Notes (Signed)
Tamsen Snider, MD at bedside.

## 2015-12-30 NOTE — Consult Note (Signed)
PULMONARY / CRITICAL CARE MEDICINE   Name: Patrick [name] MRN: [id number] DOB: 27-Apr-1946    ADMISSION DATE:  12/30/2015 CONSULTATION DATE:  12/30/2015  REFERRING MD :  Dr. Sherral Hammers  CHIEF COMPLAINT:  Concern for intubation  HISTORY OF PRESENT ILLNESS:   69yo male with h/o CAD and hyperlipidemia presented with acute onset of chest pain and SOB.  He was taken to the cardiac cath lab with elevated troponins and found to have no acute lesion to intervene on but did have LVEF 35% with LVEDP 29.  He then experienced an acute decompensation of his respiratory status and treated with nebulizers and steroids without significant relief.  He was placed on bipap and CCM was called.  PAST MEDICAL HISTORY :   has a past medical history of Hyperlipidemia; Hypertension; CAD (coronary artery disease); and COPD (chronic obstructive pulmonary disease) ([place]).  has no past surgical history on file. Prior to Admission medications   Medication Sig Start Date End Date Taking? Authorizing Provider  atorvastatin (LIPITOR) 40 MG tablet Take 40 mg by mouth daily.   Yes Historical Provider, MD  carvedilol (COREG) 3.125 MG tablet Take 3.125 mg by mouth 2 (two) times daily with a meal.   Yes Historical Provider, MD  cilostazol (PLETAL) 100 MG tablet Take 100 mg by mouth 2 (two) times daily.   Yes Historical Provider, MD  clopidogrel (PLAVIX) 75 MG tablet Take 75 mg by mouth daily.   Yes Historical Provider, MD  hydrochlorothiazide (HYDRODIURIL) 12.5 MG tablet Take 12.5 mg by mouth daily.   Yes Historical Provider, MD  Multiple Vitamin (MULTIVITAMIN WITH MINERALS) TABS tablet Take 1 tablet by mouth daily. Centrum Silver   Yes Historical Provider, MD  nitrofurantoin (MACRODANTIN) 100 MG capsule Take 100 mg by mouth 2 (two) times daily. 7 day course filled 12/25/15 12/25/15  Yes Historical Provider, MD  ramipril (ALTACE) 10 MG capsule Take 10 mg by mouth daily.   Yes Historical Provider, MD  Tetrahydroz-Polyvinyl Al-Povid  (MURINE TEARS PLUS OP) Place 1 drop into both eyes daily.   Yes Historical Provider, MD  vitamin C (ASCORBIC ACID) 500 MG tablet Take 500 mg by mouth daily as needed (immune system boost).   Yes Historical Provider, MD  Vitamin D, Ergocalciferol, (DRISDOL) 50000 UNITS CAPS capsule Take 50,000 Units by mouth every 7 (seven) days. Wednesdays   Yes Historical Provider, MD   Allergies  Allergen Reactions  . Aspirin Swelling  . Cephalexin Itching  . Ibuprofen Swelling    FAMILY HISTORY:  indicated that his mother is deceased. He indicated that his father is deceased. He indicated that his sister is alive. He indicated that his brother is alive.  SOCIAL HISTORY:  reports that he has been smoking.  He does not have any smokeless tobacco history on file. He reports that he does not drink alcohol or use illicit drugs.   SUBJECTIVE:   VITAL SIGNS: Temp:  [97.8 F (36.6 C)-99.7 F (37.6 C)] 98.7 F (37.1 C) (12/31 1600) Pulse Rate:  [0-141] 134 (12/31 1830) Resp:  [0-35] 29 (12/31 1830) BP: (90-194)/(52-115) 136/76 mmHg (12/31 1830) SpO2:  [0 %-99 %] 97 % (12/31 1830) Weight:  [57.6 kg (126 lb 15.8 oz)-61.689 kg (136 lb)] 57.6 kg (126 lb 15.8 oz) (12/31 1603) HEMODYNAMICS:   VENTILATOR SETTINGS:   INTAKE / OUTPUT:  Intake/Output Summary (Last 24 hours) at 12/30/15 1912 Last data filed at 12/30/15 1800  Gross per 24 hour  Intake 1245.81 ml  Output  700 ml  Net 545.81 ml    PHYSICAL EXAMINATION: General:  Somnolent, arousable to pain  Neuro:  Somnolent arouses to sternal rub and trap squeeze HEENT:  NCAT MMM Cardiovascular:  Tachycardia, 99991111 systolic murmur 3/6 across precordium.  JVD 7cm Lungs:  Bibasilar rales and bilateral wheezing. Abdomen:  Soft, non-tender, normal bowel sounds Musculoskeletal:  Normal bulk and tone Skin:  No rashes Ext: no c/c/e   LABS:  CBC  Recent Labs Lab 12/30/15 0122 12/30/15 0506  WBC 14.4* 10.5  HGB 17.1* 15.3  HCT 47.4 42.4  PLT  180 133*   Coag's  Recent Labs Lab 12/30/15 0506  INR 1.02   BMET  Recent Labs Lab 12/30/15 0122 12/30/15 0506 12/30/15 1308  NA 137  --  133*  K 3.9  --  3.7  CL 97*  --  97*  CO2 27  --  26  BUN 9  --  12  CREATININE 0.76 0.69 0.73  GLUCOSE 219*  --  207*   Electrolytes  Recent Labs Lab 12/30/15 0122 12/30/15 1308  CALCIUM 9.8 9.1   Sepsis Markers  Recent Labs Lab 12/30/15 0506 12/30/15 0905  LATICACIDVEN 1.3 1.9  PROCALCITON 0.33  --    ABG  Recent Labs Lab 12/30/15 1330  PHART 7.413  PCO2ART 42.1  PO2ART 69.6*   Liver Enzymes  Recent Labs Lab 12/30/15 0122  AST 28  ALT 24  ALKPHOS 62  BILITOT 1.0  ALBUMIN 4.1   Cardiac Enzymes  Recent Labs Lab 12/30/15 0506 12/30/15 0905 12/30/15 1553  TROPONINI 1.20* 4.77* 5.17*   Glucose No results for input(s): GLUCAP in the last 168 hours.  Imaging Dg Chest Port 1 View  12/30/2015  CLINICAL DATA:  Acute onset of shortness of breath. Initial encounter. EXAM: PORTABLE CHEST 1 VIEW COMPARISON:  Chest radiograph from 02/03/2013 FINDINGS: The lungs are well-aerated. Mild left basilar airspace opacity may reflect atelectasis or mild pneumonia. Mild vascular congestion is noted. There is no evidence of pleural effusion or pneumothorax. The cardiomediastinal silhouette is within normal limits. No acute osseous abnormalities are seen. IMPRESSION: Mild left basilar airspace opacity may reflect atelectasis or mild pneumonia. Mild vascular congestion noted. Electronically Signed   By: Garald Balding M.D.   On: 12/30/2015 02:31     ASSESSMENT / PLAN:  69yo CM with acute hypoxemic and hypercapnic respiratory failure likely due to acute decompensation of HFrEF.  Given LHC with no acute coronary disease and LVEF 35-45% and LVEDP significantly elevated at 29.   Recommend:  - Furosemide 80mg  IV x1 - foley cath - d/ced tamiflu - cont Bipap - EKG sinus tach without acute ischemia - d/c all sedating  medications - ABG with mild hypercapnia - no indication for intubation at this time. - Cont antibiotics though doubt PNA - duonebs Q4h scheduled - added pulmicort BID - Cont brovana - Solumedrol changed to 20mg  IV Q2hrs  Total critical care time: 45 min  Critical care time was exclusive of separately billable procedures and treating other patients.  Critical care was necessary to treat or prevent imminent or life-threatening deterioration.  Critical care was time spent personally by me on the following activities: development of treatment plan with patient and/or surrogate as well as nursing, discussions with consultants, evaluation of patient's response to treatment, examination of patient, obtaining history from patient or surrogate, ordering and performing treatments and interventions, ordering and review of laboratory studies, ordering and review of radiographic studies, pulse oximetry and re-evaluation of patient's  condition.   Meribeth Mattes, DO., MS [facility]       Pulmonary and [place] Pager: [phone]  12/30/2015, 7:12 PM

## 2015-12-30 NOTE — Progress Notes (Signed)
ANTIBIOTIC CONSULT NOTE - INITIAL  Pharmacy Consult for Vancomycin and Levaquin Indication: pneumonia  Allergies  Allergen Reactions  . Aspirin Swelling  . Cephalexin Itching  . Ibuprofen Swelling    Patient Measurements: Height: 5\' 8"  (172.7 cm) Weight: 136 lb (61.689 kg) IBW/kg (Calculated) : 68.4  Vital Signs: BP: 112/62 mmHg (Patrick/31 0318) Pulse Rate: 135 (Patrick/31 0318) Intake/Output from previous day:   Intake/Output from this shift:    Labs:  Recent Labs  Patrick/31/16 0122  WBC 14.4*  HGB 17.1*  PLT 180  CREATININE 0.76   Estimated Creatinine Clearance: 76.1 mL/min (by C-G formula based on Cr of 0.76). No results for input(s): VANCOTROUGH, VANCOPEAK, VANCORANDOM, GENTTROUGH, GENTPEAK, GENTRANDOM, TOBRATROUGH, TOBRAPEAK, TOBRARND, AMIKACINPEAK, AMIKACINTROU, AMIKACIN in the last 72 hours.   Microbiology: No results found for this or any previous visit (from the past 720 hour(s)).  Medical History: Past Medical History  Diagnosis Date  . Hyperlipidemia   . Hypertension   . CAD (coronary artery disease)   . COPD (chronic obstructive pulmonary disease) ([facility])     Medications:  See electronic med rec  Assessment: 69 y.o. Patrick [name] presents with SOB. WBC elevated to 14.4. Afeb. Estimated CrCl 75 ml/min. To begin Vancomycin and Levaquin for PNA.   Goal of Therapy:  Vancomycin trough level 15-20 mcg/ml  Plan:  Levaquin 750mg  IV q24h Vancomycin 750mg  IV q12h Will f/u micro data, renal function, and pt's clinical condition Vanc trough prn  Sherlon Handing, PharmD, BCPS Clinical pharmacist, pager [phone] Patrick/31/2016,3:21 AM

## 2015-12-30 NOTE — ED Notes (Signed)
Pt desatted to 85% on room air when sitting on the side of the bed. This RN placed the pt on 3L, and his sats increased to 92%.

## 2015-12-30 NOTE — Progress Notes (Signed)
ANTICOAGULATION CONSULT NOTE - Initial Consult  Pharmacy Consult for Heparin  Indication: chest pain/ACS  Allergies  Allergen Reactions  . Aspirin Swelling  . Cephalexin Itching  . Ibuprofen Swelling    Patient Measurements: Height: 5\' 8"  (172.7 cm) Weight: 136 lb (61.689 kg) IBW/kg (Calculated) : 68.4   Medical History: Past Medical History  Diagnosis Date  . Hyperlipidemia   . Hypertension   . CAD (coronary artery disease)   . COPD (chronic obstructive pulmonary disease) ([facility])     Assessment: 69 y/o M with worsening shortness of breath, POC troponin is elevated, starting heparin, paged CODE STEMI, no anti-coagulants PTA, CBC good, other labs are in process.   Goal of Therapy:  Heparin level 0.3-0.7 units/ml Monitor platelets by anticoagulation protocol: Yes   Plan:  -Heparin 4000 units BOLUS -Start heparin drip at 800 units/hr -1000 HL -Daily CBC/HL -Monitor for bleeding  Narda Bonds 12/30/2015,1:48 AM

## 2015-12-30 NOTE — Progress Notes (Signed)
CRITICAL VALUE ALERT  Critical value received:  Troponin 1.2  Date of notification:  12/30/2015  Time of notification:  0621  Critical value read back:Yes.    Nurse who received alert:  Fernand Parkins RN  MD notified (1st page):  Fredirick Maudlin NP   Time of first page:  0629  Responding MD:  Fredirick Maudlin NP  Time MD responded:  [phone]

## 2015-12-30 NOTE — Progress Notes (Signed)
CRITICAL VALUE ALERT  Critical value received:  Trop. 4.77  Date of notification:  12/30/2015  Time of notification:  1030  Critical value read back:Yes.    Nurse who received alert:  Paula Compton, RN   Patient post heart cath, value expected.

## 2015-12-30 NOTE — Progress Notes (Signed)
Pt on bipap, still tachypneic and tachycardic. Ashen in color, pt only moaning and groaning to voice stimulus. BP better. Elink notified and attending MD made aware. Will continue to monitor closely.

## 2015-12-30 NOTE — Progress Notes (Signed)
Called to room due to pt's increased WOB, on arrival pt on 2L, RR 35 with increased WOB.  Duoneb tx given, RN at bedside and administered IV steroid & morphine with no relief, MD called to bedside and pt placed on Bipap at this time due to increased WOB, pt tolerating well, RT will monitor.

## 2015-12-30 NOTE — Progress Notes (Signed)
RN called to pt room for c/o increased SOB. Upon assessment noted pt with increased WOB and SOB, RR 30s, requiring more oxygen to keep saturations above 92%. RT called to give neb and evaluate patient. MD paged and made aware. New orders received and followed. Will continue to monitor closely.

## 2015-12-31 ENCOUNTER — Inpatient Hospital Stay (HOSPITAL_COMMUNITY): Payer: Medicare Other

## 2015-12-31 DIAGNOSIS — J9612 Chronic respiratory failure with hypercapnia: Secondary | ICD-10-CM

## 2015-12-31 DIAGNOSIS — J9601 Acute respiratory failure with hypoxia: Secondary | ICD-10-CM

## 2015-12-31 DIAGNOSIS — J441 Chronic obstructive pulmonary disease with (acute) exacerbation: Secondary | ICD-10-CM

## 2015-12-31 DIAGNOSIS — I5021 Acute systolic (congestive) heart failure: Secondary | ICD-10-CM

## 2015-12-31 LAB — CBC
HEMATOCRIT: 43.2 % (ref 39.0–52.0)
Hemoglobin: 15.6 g/dL (ref 13.0–17.0)
MCH: 32.4 pg (ref 26.0–34.0)
MCHC: 36.1 g/dL — ABNORMAL HIGH (ref 30.0–36.0)
MCV: 89.8 fL (ref 78.0–100.0)
PLATELETS: 139 10*3/uL — AB (ref 150–400)
RBC: 4.81 MIL/uL (ref 4.22–5.81)
RDW: 12 % (ref 11.5–15.5)
WBC: 9.6 10*3/uL (ref 4.0–10.5)

## 2015-12-31 LAB — BASIC METABOLIC PANEL
ANION GAP: 13 (ref 5–15)
BUN: 21 mg/dL — ABNORMAL HIGH (ref 6–20)
CALCIUM: 8.7 mg/dL — AB (ref 8.9–10.3)
CO2: 24 mmol/L (ref 22–32)
CREATININE: 1.04 mg/dL (ref 0.61–1.24)
Chloride: 97 mmol/L — ABNORMAL LOW (ref 101–111)
Glucose, Bld: 218 mg/dL — ABNORMAL HIGH (ref 65–99)
Potassium: 3.8 mmol/L (ref 3.5–5.1)
SODIUM: 134 mmol/L — AB (ref 135–145)

## 2015-12-31 LAB — MAGNESIUM: MAGNESIUM: 1.7 mg/dL (ref 1.7–2.4)

## 2015-12-31 LAB — PHOSPHORUS: PHOSPHORUS: 3.1 mg/dL (ref 2.5–4.6)

## 2015-12-31 MED ORDER — ALBUTEROL SULFATE (2.5 MG/3ML) 0.083% IN NEBU: 2.5000 mg | INHALATION_SOLUTION | RESPIRATORY_TRACT | Status: DC | PRN

## 2015-12-31 MED ORDER — SODIUM CHLORIDE 0.9 % IV BOLUS (SEPSIS)
250.0000 mL | Freq: Once | INTRAVENOUS | Status: AC
  Administered 2015-12-31: 250 mL via INTRAVENOUS

## 2015-12-31 MED ORDER — MAGNESIUM SULFATE IN D5W 10-5 MG/ML-% IV SOLN
1.0000 g | Freq: Once | INTRAVENOUS | Status: AC
  Administered 2015-12-31: 1 g via INTRAVENOUS
  Filled 2015-12-31: qty 100

## 2015-12-31 NOTE — Progress Notes (Signed)
[facility] Progress Note Patient Name: THAO DAFFERN DOB: [birth year] MRN: [id number]   Date of Service  12/31/2015  HPI/Events of Note  Mg++ = 1.7 and Creatinine = 1.04.  eICU Interventions  Will replete Mg++.     Intervention Category Intermediate Interventions: Electrolyte abnormality - evaluation and management  Sommer,Steven Eugene 12/31/2015, 4:21 AM

## 2015-12-31 NOTE — Progress Notes (Signed)
[place] TEAM 1 - Stepdown/ICU TEAM Progress Note  Patrick [name] [id number] DOB: 12/27/1946 DOA: 12/30/2015 PCP: No primary care provider on file.  Admit HPI / Brief Narrative: Patrick [name] is a 70 y.o. WM PMHx HTN, HLD, COPD, CAD with chronically occluded RCA.   Patient presents to the ED at [facility] with c/o worsening SOB and chest pain onset this morning. He reports associated productive cough. His room mate is sick with "flu" and has just spent most of the earlier evening at the [facility] ED.  Work up in ED is initially suspicious for STEMI, patient is taken to the cath lab initially by Dr. Einar Gip for [facility]. LHC demonstrates only the chronically occluded RCA, as well as elevated LVEDP.  CXR demonstrates pulmonary vascular congestion, and LLL infiltrate suspicious of PNA.   HPI/Subjective: 1/1  A/O 4, states continues to smoke. States not on home O2. Roommate with flu.  Assessment/Plan: Sepsis/COPD Exacerbation/LLL CAP -Meets criteria on admission for SIRS; HR> 90, RR> 20 -Complete full 7-day course of levofloxacin -Solu-Medrol 60 mg daily -DuoNeb QID -Brovana neb BID -Mucinex DM BID -Continue empiric Tamiflu -Saline KVO Normal saline 97ml/hr -Titrate O2 to maintain SPO2> [id number]  Acute systolic CHF -Most likely multifactorial to include sepsis/COPD exacerbation, tobacco abuse. S/P LHC cardiology believes negative acute MI. -Strict in and out since admission +534.75ml -Daily a.m. Weight admission weight= 61.6 kg        1/1 weight= 58.2 kg  Demand ischemia -12/31 S/P Left heart cath showing EF 40-45% ;  -See acute systolic CHF rdiology most likely demand ischemia instead of acute MI  Tobacco abuse -Discussed at length with patient concerning sequela of continuing to smoke to include emphysema, lung cancer, MI, stroke, death -Patient states would like to stop will discuss options once more stable.  Hypomagnesemia -Magnesium goal> 2 -Magnesium IV 1 gm   Code Status:  FULL Family Communication: no family present at time of exam Disposition Plan: Resolution COPD exacerbation    Consultants: Dr. Einar Gip cardiology    Procedure/Significant Events: 12/31 left heart cath;. -Occluded mid RCA stent placed in 2007, the anatomy of occluded RCA is known from 2011 with bridging collateral and also onto lateral collaterals. -Mild disease left coronary arteries.- Collaterals to right coronary artery. - Mild diffuse global hypokinesis of the left ventricle, EF around 40-45%,     Culture 12/31 MRSA by PCR negative 12/31 blood pending 12/31 influenza A/B/H1N1 negative 12/31 strep pneumo urine antigen negative 12/31 Legionella urine antigen pending 12/31 respiratory virus panel pending 12/31 HIV negative    Antibiotics: Levofloxacin 12/31>> Vancomycin 12/31>> 12/31  DVT prophylaxis: Heparin   Devices    LINES / TUBES:      Continuous Infusions: . sodium chloride 10 mL/hr at 12/30/15 2300    Objective: VITAL SIGNS: Temp: 98 F (36.7 C) (01/01 0800) Temp Source: Oral (01/01 0800) BP: 87/51 mmHg (01/01 0800) Pulse Rate: 100 (01/01 0800) SPO2; FIO2:   Intake/Output Summary (Last 24 hours) at 12/31/15 0844 Last data filed at 12/31/15 0800  Gross per 24 hour  Intake   1430 ml  Output   1000 ml  Net    430 ml     Exam: General: A/O 4, positive acute respiratory distress Eyes: Negative headache, negative scleral hemorrhage ENT: Negative Runny nose, negative ear pain, negative gingival bleeding, Neck:  Negative scars, masses, torticollis, lymphadenopathy, JVD Lungs: diffuse poor air movement, diffuse inspiratory/expiratory wheeze, negative rhonchi, negative crackles  Cardiovascular: Regular rate and rhythm without murmur  gallop or rub normal S1 and S2 Abdomen:negative abdominal pain, nondistended, positive soft, bowel sounds, no rebound, no ascites, no appreciable mass Extremities: No significant cyanosis, clubbing, or edema  bilateral lower extremities Psychiatric:  Negative depression, negative anxiety, negative fatigue, negative mania  Neurologic:  Cranial nerves II through XII intact, tongue/uvula midline, all extremities muscle strength 5/5, sensation intact throughout, negative dysarthria, negative expressive aphasia, negative receptive aphasia.   Data Reviewed: Basic Metabolic Panel:  Recent Labs Lab 12/30/15 0122 12/30/15 0506 12/30/15 1308 12/31/15 0200  NA 137  --  133* 134*  K 3.9  --  3.7 3.8  CL 97*  --  97* 97*  CO2 27  --  26 24  GLUCOSE 219*  --  207* 218*  BUN 9  --  12 21*  CREATININE 0.76 0.69 0.73 1.04  CALCIUM 9.8  --  9.1 8.7*  MG  --   --   --  1.7  PHOS  --   --   --  3.1   Liver Function Tests:  Recent Labs Lab 12/30/15 0122  AST 28  ALT 24  ALKPHOS 62  BILITOT 1.0  PROT 7.3  ALBUMIN 4.1   No results for input(s): LIPASE, AMYLASE in the last 168 hours. No results for input(s): AMMONIA in the last 168 hours. CBC:  Recent Labs Lab 12/30/15 0122 12/30/15 0506 12/31/15 0200  WBC 14.4* 10.5 9.6  NEUTROABS 12.8*  --   --   HGB 17.1* 15.3 15.6  HCT 47.4 42.4 43.2  MCV 91.3 91.2 89.8  PLT 180 133* 139*   Cardiac Enzymes:  Recent Labs Lab 12/30/15 0506 12/30/15 0905 12/30/15 1553  TROPONINI 1.20* 4.77* 5.17*   BNP (last 3 results)  Recent Labs  12/30/15 0145  BNP 86.8    ProBNP (last 3 results) No results for input(s): PROBNP in the last 8760 hours.  CBG: No results for input(s): GLUCAP in the last 168 hours.  Recent Results (from the past 240 hour(s))  MRSA PCR Screening     Status: None   Collection Time: 12/30/15  3:38 AM  Result Value Ref Range Status   MRSA by PCR NEGATIVE NEGATIVE Final    Comment:        The GeneXpert MRSA Assay (FDA approved for NASAL specimens only), is one component of a comprehensive MRSA colonization surveillance program. It is not intended to diagnose MRSA infection nor to guide or monitor treatment  for MRSA infections.      Studies:  Recent x-ray studies have been reviewed in detail by the Attending Physician  Scheduled Meds:  Scheduled Meds: . antiseptic oral rinse  7 mL Mouth Rinse BID  . arformoterol  15 mcg Nebulization BID  . atorvastatin  40 mg Oral q1800  . budesonide (PULMICORT) nebulizer solution  0.5 mg Nebulization BID  . cilostazol  100 mg Oral BID  . clopidogrel  75 mg Oral Daily  . dextromethorphan-guaiFENesin  1 tablet Oral BID  . heparin  5,000 Units Subcutaneous 3 times per day  . Influenza vac split quadrivalent PF  0.5 mL Intramuscular Tomorrow-1000  . ipratropium-albuterol  3 mL Nebulization Q4H  . levofloxacin (LEVAQUIN) IV  750 mg Intravenous Q24H  . methylPREDNISolone (SOLU-MEDROL) injection  20 mg Intravenous Q6H  . Vitamin D (Ergocalciferol)  50,000 Units Oral Q7 days    Time spent on care of this patient: 40 mins   Carrah Eppolito, Geraldo Docker , MD  [facility] Office  [phone] Pager - [phone]  On-Call/Text Page:      [email or web address]      password [email or web address]  If 7PM-7AM, please contact night-coverage [email or web address] Password [email or web address] 12/31/2015, 8:44 AM   LOS: 1 day   Care during the described time interval was provided by me .  I have reviewed this patient's available data, including medical history, events of note, physical examination, and all test results as part of my evaluation. I have personally reviewed and interpreted all radiology studies.   Dia Crawford, MD [phone] Pager

## 2015-12-31 NOTE — Progress Notes (Signed)
No further recommendations from cardiac standpoint. Not on beta blockers due to low BP and copd exacerbation. Start Metoprolol succinate 25 mg daily when stable.   Will signoff.  Scheduled Meds: . antiseptic oral rinse  7 mL Mouth Rinse BID  . arformoterol  15 mcg Nebulization BID  . atorvastatin  40 mg Oral q1800  . budesonide (PULMICORT) nebulizer solution  0.5 mg Nebulization BID  . cilostazol  100 mg Oral BID  . clopidogrel  75 mg Oral Daily  . dextromethorphan-guaiFENesin  1 tablet Oral BID  . heparin  5,000 Units Subcutaneous 3 times per day  . Influenza vac split quadrivalent PF  0.5 mL Intramuscular Tomorrow-1000  . ipratropium-albuterol  3 mL Nebulization Q4H  . levofloxacin (LEVAQUIN) IV  750 mg Intravenous Q24H  . methylPREDNISolone (SOLU-MEDROL) injection  20 mg Intravenous Q6H  . Vitamin D (Ergocalciferol)  50,000 Units Oral Q7 days   Continuous Infusions: . sodium chloride 10 mL/hr at 12/30/15 2300   PRN Meds:.acetaminophen, albuterol, benzonatate, ondansetron (ZOFRAN) IV

## 2015-12-31 NOTE — Progress Notes (Signed)
PULMONARY / CRITICAL CARE MEDICINE   Name: Patrick [name] MRN: [id number] DOB: 04-17-1946    ADMISSION DATE:  12/30/2015 CONSULTATION DATE:  12/30/2015  REFERRING MD :  Dr. Sherral Hammers  CHIEF COMPLAINT:  Concern for intubation  STUDIES: LHC 12/30/15:  Distal RCA filled by collaterals. Mid-RCA lesion 100% stenosed. RPDA filled by collaterals. LV EF 35-45% by visual & global hypokinesis w/ EF 40-45%. LVEDP 33mmHg. Port CXR 12/31:  Personally reviewed by me. Minimal vascular congestion. No effusion or opacity appreciated. Hyperinflated.  Port CXR 01/01:  Personally reviewed by me. No opacity or effusion appreciated. Remains hyperinflated.  MICROBIOLOGY: Blood Ctx x2 12/31>>> Urine Legionella Ag 12/31>>> Urine Strep Ag 12/31:  Negative  Influenza PCR 12/31:  Negative HIV:  Negative  ANTIBIOTICS: Levaquin 12/31>>  LINES/TUBES: Foley 12/31>> PIV x3  SUBJECTIVE: Patient reports dyspnea slightly improved today. Denies any productive cough. No subjective fever, chills, or sweats. Patient was on BiPAP overnight briefly. Blood pressure this morning marginal.  REVIEW OF SYSTEMS: Reports chest pain has resolved. Denies any current chest tightness or pressure. No nausea or vomiting.  VITAL SIGNS: Temp:  [98 F (36.7 C)-98.7 F (37.1 C)] 98 F (36.7 C) (01/01 0800) Pulse Rate:  [100-134] 107 (01/01 0900) Resp:  [18-34] 20 (01/01 0900) BP: (72-194)/(51-115) 84/59 mmHg (01/01 0900) SpO2:  [90 %-100 %] 99 % (01/01 0900) FiO2 (%):  [30 %-50 %] 30 % (01/01 0500) Weight:  [126 lb 15.8 oz (57.6 kg)-128 lb 4.9 oz (58.2 kg)] 128 lb 4.9 oz (58.2 kg) (01/01 0134) HEMODYNAMICS:   VENTILATOR SETTINGS: Vent Mode:  [-]  FiO2 (%):  [30 %-50 %] 30 % INTAKE / OUTPUT:  Intake/Output Summary (Last 24 hours) at 12/31/15 1004 Last data filed at 12/31/15 0800  Gross per 24 hour  Intake   1310 ml  Output   1000 ml  Net    310 ml    PHYSICAL EXAMINATION: General:  Awake. Alert. No acute distress.    Integument:  Warm & dry. No rash on exposed skin.  HEENT:  No scleral injection or icterus.nasal cannula in place . Cardiovascular:  Regular rate. No edema. normal S1 & S2  Pulmonary:  Symmetrically decreased aeration with prolonged exhalation phase.  Normal work of breathing on nasal cannula oxygen. Speaking in complete sentences. Abdomen: Soft. Normal bowel sounds. Nondistended.    LABS:  CBC  Recent Labs Lab 12/30/15 0122 12/30/15 0506 12/31/15 0200  WBC 14.4* 10.5 9.6  HGB 17.1* 15.3 15.6  HCT 47.4 42.4 43.2  PLT 180 133* 139*   Coag's  Recent Labs Lab 12/30/15 0506  INR 1.02   BMET  Recent Labs Lab 12/30/15 0122 12/30/15 0506 12/30/15 1308 12/31/15 0200  NA 137  --  133* 134*  K 3.9  --  3.7 3.8  CL 97*  --  97* 97*  CO2 27  --  26 24  BUN 9  --  12 21*  CREATININE 0.76 0.69 0.73 1.04  GLUCOSE 219*  --  207* 218*   Electrolytes  Recent Labs Lab 12/30/15 0122 12/30/15 1308 12/31/15 0200  CALCIUM 9.8 9.1 8.7*  MG  --   --  1.7  PHOS  --   --  3.1   Sepsis Markers  Recent Labs Lab 12/30/15 0506 12/30/15 0905  LATICACIDVEN 1.3 1.9  PROCALCITON 0.33  --    ABG  Recent Labs Lab 12/30/15 1330 12/30/15 1933  PHART 7.413 7.366  PCO2ART 42.1 48.4*  PO2ART 69.6* 75.0*  Liver Enzymes  Recent Labs Lab 12/30/15 0122  AST 28  ALT 24  ALKPHOS 62  BILITOT 1.0  ALBUMIN 4.1   Cardiac Enzymes  Recent Labs Lab 12/30/15 0506 12/30/15 0905 12/30/15 1553  TROPONINI 1.20* 4.77* 5.17*   Glucose No results for input(s): GLUCAP in the last 168 hours.  Imaging Dg Chest Port 1 View  12/31/2015  CLINICAL DATA:  Shortness of breath. History of coronary artery disease, COPD and hypertension. EXAM: PORTABLE CHEST 1 VIEW COMPARISON:  12/30/2015 FINDINGS: Stable chronic lung disease with mild hyperinflation. Minimal atelectatic changes are present at the right lung base. No focal airspace consolidation, edema, pneumothorax or pleural fluid  identified. The heart size and mediastinal contours are normal. IMPRESSION: Stable chronic lung disease. Mild atelectasis at the right lung base. No focal infiltrate identified. Electronically Signed   By: Aletta Edouard M.D.   On: 12/31/2015 09:24     ASSESSMENT / PLAN:  70yo CM with acute hypoxemic and hypercapnic respiratory failure likely due to acute decompensation of HFrEF.  Given LHC with no acute coronary disease and LVEF 35-45% and LVEDP significantly elevated at 29.  Patient did have significant improvement in his respiratory status overnight. His blood pressure this morning is somewhat borderline. I do question whether or not there could be a viral etiology to his COPD exacerbation and acute decompensation. I'm going to cautiously give him back IV fluid.  1. Acute Hypoxic Respiratory Failure: Multifactorial. Recommend continuing to wean FiO2 for saturation 88-92 percent. 2. COPD Exacerbation:  Checking Respiratory Viral Panel. Continuing to nebs every 4 hours & Solu-Medrol IV every 6 hours. Continuing Brovana & budesonide for now. 3. Chronic Hypercarbic Respiratory Failure: No signs of acute decompensation. Likely due to underlying COPD. 4. Acute Systolic Congestive Heart Failure: No signs of volume overload at this time. LVEDP elevated on left heart catheter catheterization. Holding on further diuresis given marginal blood pressure. 5. Elevated Troponin I:  S/P LHC 12/31. Per Cardiology. 6. Borderline Hypotension:  NS 250cc over 1 hour. Recommend holding on further diuresis.   Sonia Baller Ashok Cordia, M.D. [facility] Pulmonary & Critical Care Pager:  [phone] After 3pm or if no response, call [phone] 12/31/2015, 10:04 AM

## 2015-12-31 NOTE — Progress Notes (Signed)
bp in the upper 70s-Dr Loverde notified and w/ order to d/c lasix

## 2016-01-01 DIAGNOSIS — J189 Pneumonia, unspecified organism: Secondary | ICD-10-CM

## 2016-01-01 LAB — CBC
HCT: 39.3 % (ref 39.0–52.0)
Hemoglobin: 14.3 g/dL (ref 13.0–17.0)
MCH: 32.5 pg (ref 26.0–34.0)
MCHC: 36.4 g/dL — ABNORMAL HIGH (ref 30.0–36.0)
MCV: 89.3 fL (ref 78.0–100.0)
PLATELETS: 153 10*3/uL (ref 150–400)
RBC: 4.4 MIL/uL (ref 4.22–5.81)
RDW: 11.9 % (ref 11.5–15.5)
WBC: 7.4 10*3/uL (ref 4.0–10.5)

## 2016-01-01 LAB — LEGIONELLA ANTIGEN, URINE

## 2016-01-01 MED ORDER — METHYLPREDNISOLONE SODIUM SUCC 40 MG IJ SOLR
20.0000 mg | Freq: Four times a day (QID) | INTRAMUSCULAR | Status: AC
  Administered 2016-01-01 (×2): 20 mg via INTRAVENOUS
  Filled 2016-01-01 (×2): qty 1

## 2016-01-01 MED ORDER — HYDROCHLOROTHIAZIDE 25 MG PO TABS
12.5000 mg | ORAL_TABLET | Freq: Every day | ORAL | Status: DC
Start: 2016-01-01 — End: 2016-01-04
  Administered 2016-01-01 – 2016-01-04 (×4): 12.5 mg via ORAL
  Filled 2016-01-01 (×4): qty 1

## 2016-01-01 MED ORDER — RAMIPRIL 2.5 MG PO CAPS
2.5000 mg | ORAL_CAPSULE | Freq: Every day | ORAL | Status: DC
  Administered 2016-01-01 – 2016-01-04 (×4): 2.5 mg via ORAL
  Filled 2016-01-01 (×4): qty 1

## 2016-01-01 MED ORDER — ADULT MULTIVITAMIN W/MINERALS CH
1.0000 | ORAL_TABLET | Freq: Every day | ORAL | Status: DC
  Administered 2016-01-01 – 2016-01-04 (×4): 1 via ORAL
  Filled 2016-01-01 (×5): qty 1

## 2016-01-01 MED ORDER — CARVEDILOL 3.125 MG PO TABS
3.1250 mg | ORAL_TABLET | Freq: Two times a day (BID) | ORAL | Status: DC
  Administered 2016-01-01 – 2016-01-04 (×6): 3.125 mg via ORAL
  Filled 2016-01-01 (×6): qty 1

## 2016-01-01 MED ORDER — IPRATROPIUM-ALBUTEROL 0.5-2.5 (3) MG/3ML IN SOLN
3.0000 mL | Freq: Three times a day (TID) | RESPIRATORY_TRACT | Status: DC
  Administered 2016-01-01 – 2016-01-03 (×8): 3 mL via RESPIRATORY_TRACT
  Filled 2016-01-01 (×8): qty 3

## 2016-01-01 MED ORDER — ZOLPIDEM TARTRATE 5 MG PO TABS
5.0000 mg | ORAL_TABLET | Freq: Once | ORAL | Status: AC
  Administered 2016-01-01: 5 mg via ORAL
  Filled 2016-01-01: qty 1

## 2016-01-01 MED ORDER — ENOXAPARIN SODIUM 40 MG/0.4ML ~~LOC~~ SOLN
40.0000 mg | SUBCUTANEOUS | Status: DC
  Administered 2016-01-01 – 2016-01-03 (×3): 40 mg via SUBCUTANEOUS
  Filled 2016-01-01 (×3): qty 0.4

## 2016-01-01 MED ORDER — PREDNISONE 20 MG PO TABS
30.0000 mg | ORAL_TABLET | Freq: Every day | ORAL | Status: DC
  Administered 2016-01-02 – 2016-01-04 (×3): 30 mg via ORAL
  Filled 2016-01-01 (×3): qty 1

## 2016-01-01 NOTE — Progress Notes (Addendum)
[place] TEAM 1 - Stepdown/ICU TEAM PROGRESS NOTE  DILPREET MODESTO [id number] DOB: [birth year] DOA: 12/30/2015 PCP: No primary care provider on file.  Admit HPI / Brief Narrative: 70 y.o. M Hx HTN, HLD, COPD, and CAD with a chronically occluded RCA who presented to the ED with c/o SOB and chest pain onset the day of admit, w/ productive cough.   Work up in ED was initially suspicious for STEMI.  Patient was taken to the cath lab by Dr. Einar Gip. LHC demonstrated only the chronically occluded RCA, as well as elevated LVEDP.  CXR demonstrated pulmonary vascular congestion, and LLL infiltrate suspicious for PNA.  HPI/Subjective: The patient is resting comfortably in bed.  He states he feels somewhat short of breath today but overall has improved.  He denies chest pain nausea vomiting abdominal pain or lower extremity swelling.  He has many questions about heart failure as this is a new diagnosis for him.  Assessment/Plan:  Sepsis due to LLL CAP - Acute COPD Exacerbation - Acute hypoxic resp failure  -now on 2L [place] only, but does not require O2 at home - cont to wean as able - complete abx course - wean off steroids asap   Acute systolic CHF EF [id number] via cath this admit - admission weight 61.6 kg - currently 60kg w/o evicned of overload on exam - net negative ~1L since admit - discussed CHF w/ pt at bedside at length - cont education - not yet requiring diuretic - suspecd this is due to CAD    Demand ischemia -12/31 S/P Left heart cath w/ no acute findings - known chronic CAD w/ collaterals - to cont to follow w/ Dr. Einar Gip as outpt   Tobacco abuse -Discussed at length with patient concerning sequela of continuing to smoke to include emphysema, lung cancer, MI, stroke, death -patient has committed to stopping   Hypomagnesemia -Magnesium improved w/ replacement   Hyperglycemia -due to steroids - check A1c to assure no occult DM  Code Status: FULL Family Communication: no family present  at time of exam Disposition Plan: transfer to tele bed - PT/OT - wean O2 as able - educate on CHF   Consultants: PCCM Cards - [place]  Procedures: [place] 12/30/15: Distal RCA filled by collaterals. Mid-RCA lesion 100% stenosed. RPDA filled by collaterals. LV EF 35-45% by visual & global hypokinesis w/ EF 40-45%. LVEDP 1mmHg.  Antibiotics: Levaquin 12/30 >  DVT prophylaxis: levaquin   Objective: Blood pressure 119/63, pulse 96, temperature 97.7 F (36.5 C), temperature source Oral, resp. rate 21, height 5\' 8"  (1.727 m), weight 60 kg (132 lb 4.4 oz), SpO2 98 %.  Intake/Output Summary (Last 24 hours) at 01/01/16 1307 Last data filed at 01/01/16 1236  Gross per 24 hour  Intake    520 ml  Output   2075 ml  Net  -1555 ml   Exam: General: No acute respiratory distress at rest  Lungs: mild bibasilar crackles - no wheeze  Cardiovascular: Regular rate and rhythm without murmur gallop or rub Abdomen: Nontender, nondistended, soft, bowel sounds positive, no rebound, no ascites, no appreciable mass Extremities: No significant cyanosis, clubbing, or edema bilateral lower extremities  Data Reviewed: Basic Metabolic Panel:  Recent Labs Lab 12/30/15 0122 12/30/15 0506 12/30/15 1308 12/31/15 0200  NA 137  --  133* 134*  K 3.9  --  3.7 3.8  CL 97*  --  97* 97*  CO2 27  --  26 24  GLUCOSE 219*  --  207*  218*  BUN 9  --  12 21*  CREATININE 0.76 0.69 0.73 1.04  CALCIUM 9.8  --  9.1 8.7*  MG  --   --   --  1.7  PHOS  --   --   --  3.1    CBC:  Recent Labs Lab 12/30/15 0122 12/30/15 0506 12/31/15 0200 01/01/16 0238  WBC 14.4* 10.5 9.6 7.4  NEUTROABS 12.8*  --   --   --   HGB 17.1* 15.3 15.6 14.3  HCT 47.4 42.4 43.2 39.3  MCV 91.3 91.2 89.8 89.3  PLT 180 133* 139* 153    Liver Function Tests:  Recent Labs Lab 12/30/15 0122  AST 28  ALT 24  ALKPHOS 62  BILITOT 1.0  PROT 7.3  ALBUMIN 4.1   Coags:  Recent Labs Lab 12/30/15 0506  INR 1.02    Cardiac  Enzymes:  Recent Labs Lab 12/30/15 0506 12/30/15 0905 12/30/15 1553  TROPONINI 1.20* 4.77* 5.17*    Recent Results (from the past 240 hour(s))  MRSA PCR Screening     Status: None   Collection Time: 12/30/15  3:38 AM  Result Value Ref Range Status   MRSA by PCR NEGATIVE NEGATIVE Final    Comment:        The GeneXpert MRSA Assay (FDA approved for NASAL specimens only), is one component of a comprehensive MRSA colonization surveillance program. It is not intended to diagnose MRSA infection nor to guide or monitor treatment for MRSA infections.   Culture, blood (x 2)     Status: None (Preliminary result)   Collection Time: 12/30/15  5:00 AM  Result Value Ref Range Status   Specimen Description BLOOD LEFT HAND  Final   Special Requests AEROBIC BOTTLE ONLY 5ML  Final   Culture NO GROWTH 2 DAYS  Final   Report Status PENDING  Incomplete  Culture, blood (x 2)     Status: None (Preliminary result)   Collection Time: 12/30/15  5:06 AM  Result Value Ref Range Status   Specimen Description BLOOD LEFT HAND  Final   Special Requests AEROBIC BOTTLE ONLY 5ML  Final   Culture NO GROWTH 2 DAYS  Final   Report Status PENDING  Incomplete     Studies:   Recent x-ray studies have been reviewed in detail by the Attending Physician  Scheduled Meds:  Scheduled Meds: . antiseptic oral rinse  7 mL Mouth Rinse BID  . arformoterol  15 mcg Nebulization BID  . atorvastatin  40 mg Oral q1800  . budesonide (PULMICORT) nebulizer solution  0.5 mg Nebulization BID  . cilostazol  100 mg Oral BID  . clopidogrel  75 mg Oral Daily  . dextromethorphan-guaiFENesin  1 tablet Oral BID  . heparin  5,000 Units Subcutaneous 3 times per day  . Influenza vac split quadrivalent PF  0.5 mL Intramuscular Tomorrow-1000  . ipratropium-albuterol  3 mL Nebulization TID  . levofloxacin (LEVAQUIN) IV  750 mg Intravenous Q24H  . methylPREDNISolone (SOLU-MEDROL) injection  20 mg Intravenous Q6H  . Vitamin D  (Ergocalciferol)  50,000 Units Oral Q7 days    Time spent on care of this patient: 35 mins   [facility] T , MD   [facility] Office  [phone] Pager - Text Page per Shea Evans as per below:  On-Call/Text Page:      [email or web address]      password [email or web address]  If 7PM-7AM, please contact night-coverage [email or web address] Password [email or web address] 01/01/2016, 1:07 PM   LOS: 2 days

## 2016-01-01 NOTE — Progress Notes (Signed)
PULMONARY / CRITICAL CARE MEDICINE   Name: Patrick [name] MRN: [id number] DOB: March 06, 1946    ADMISSION DATE:  12/30/2015 CONSULTATION DATE:  12/30/2015  REFERRING MD :  Dr. Sherral Hammers  CHIEF COMPLAINT:  Concern for intubation  HISTORY OF PRESENT ILLNESS:  70yo male with h/o CAD and hyperlipidemia presented with acute onset of chest pain and SOB. He was taken to the cardiac cath lab with elevated troponins and found to have no acute lesion to intervene on but did have LVEF 35% with LVEDP 29. He then experienced an acute decompensation of his respiratory status and treated with nebulizers and steroids without significant relief. He was placed on bipap and CCM was called.  STUDIES: LHC 12/30/15:  Distal RCA filled by collaterals. Mid-RCA lesion 100% stenosed. RPDA filled by collaterals. LV EF 35-45% by visual & global hypokinesis w/ EF 40-45%. LVEDP 72mmHg. Port CXR 12/31:  Personally reviewed by me. Minimal vascular congestion. No effusion or opacity appreciated. Hyperinflated.  Port CXR 01/01:  Personally reviewed by me. No opacity or effusion appreciated. Remains hyperinflated.  MICROBIOLOGY: Blood Ctx x2 12/31>>> Urine Legionella Ag 12/31>>> Urine Strep Ag 12/31:  Negative  Influenza PCR 12/31:  Negative HIV:  Negative  ANTIBIOTICS: Levaquin 12/31>>  LINES/TUBES: Foley 12/31>> PIV x3  SUBJECTIVE: Symptoms have greatly improved aver the past 24 hours with diuresis. No BiPAP x 24 hours.  Chest pain resolved.  VITAL SIGNS: Temp:  [97.7 F (36.5 C)-98.5 F (36.9 C)] 97.7 F (36.5 C) (01/02 1144) Pulse Rate:  [88-113] 96 (01/02 0800) Resp:  [15-27] 21 (01/02 0800) BP: (87-119)/(47-63) 119/63 mmHg (01/02 0800) SpO2:  [94 %-98 %] 98 % (01/02 0935) Weight:  [60 kg (132 lb 4.4 oz)] 60 kg (132 lb 4.4 oz) (01/02 0100) HEMODYNAMICS:   VENTILATOR SETTINGS:   INTAKE / OUTPUT:  Intake/Output Summary (Last 24 hours) at 01/01/16 1316 Last data filed at 01/01/16 1236  Gross per 24  hour  Intake    520 ml  Output   2075 ml  Net  -1555 ml    PHYSICAL EXAMINATION: General:  Awake. Alert. No acute distress.   Integument:  Warm & dry. No rash on exposed skin.  HEENT:  No scleral injection or icterus.nasal cannula in place . Cardiovascular:  Regular rate. No edema. normal S1 & S2  Pulmonary:  Clear bilateral breath sounds. Speaking in complete sentences. Still some DOE.  Abdomen: Soft. Normal bowel sounds. Nondistended.    LABS:  CBC  Recent Labs Lab 12/30/15 0506 12/31/15 0200 01/01/16 0238  WBC 10.5 9.6 7.4  HGB 15.3 15.6 14.3  HCT 42.4 43.2 39.3  PLT 133* 139* 153   Coag's  Recent Labs Lab 12/30/15 0506  INR 1.02   BMET  Recent Labs Lab 12/30/15 0122 12/30/15 0506 12/30/15 1308 12/31/15 0200  NA 137  --  133* 134*  K 3.9  --  3.7 3.8  CL 97*  --  97* 97*  CO2 27  --  26 24  BUN 9  --  12 21*  CREATININE 0.76 0.69 0.73 1.04  GLUCOSE 219*  --  207* 218*   Electrolytes  Recent Labs Lab 12/30/15 0122 12/30/15 1308 12/31/15 0200  CALCIUM 9.8 9.1 8.7*  MG  --   --  1.7  PHOS  --   --  3.1   Sepsis Markers  Recent Labs Lab 12/30/15 0506 12/30/15 0905  LATICACIDVEN 1.3 1.9  PROCALCITON 0.33  --    ABG  Recent Labs Lab  12/30/15 1330 12/30/15 1933  PHART 7.413 7.366  PCO2ART 42.1 48.4*  PO2ART 69.6* 75.0*   Liver Enzymes  Recent Labs Lab 12/30/15 0122  AST 28  ALT 24  ALKPHOS 62  BILITOT 1.0  ALBUMIN 4.1   Cardiac Enzymes  Recent Labs Lab 12/30/15 0506 12/30/15 0905 12/30/15 1553  TROPONINI 1.20* 4.77* 5.17*   Glucose No results for input(s): GLUCAP in the last 168 hours.  Imaging No results found.   ASSESSMENT / PLAN:  70yo CM with acute hypoxemic and hypercapnic respiratory failure likely due to acute decompensation of HFrEF.  Given LHC with no acute coronary disease and LVEF 35-45% and LVEDP significantly elevated at 29.  Patient did have significant improvement in his respiratory status  overnight. His blood pressure this morning is somewhat borderline. I do question whether or not there could be a viral etiology to his COPD exacerbation and acute decompensation. I'm going to cautiously give him back IV fluid.  1. Acute Hypoxic Respiratory Failure: improved with diuresis and BiPAP. Multifactorial most likely due to Acute CHF. Recommend continuing to wean FiO2 for saturation 88-92 percent. 2. COPD Exacerbation:  Checking Respiratory Viral Panel. Continuing to nebs every 4 hours & Will transition to prednisone for taper 30mg  x 2 days, 20mg  x 2days, 10 mgx2 days then stop.   Continuing Brovana & budesonide for now. 3. Chronic Hypercarbic Respiratory Failure: No signs of acute decompensation. Likely due to underlying COPD. 4. Acute Systolic Congestive Heart Failure: No signs of volume overload at this time. LVEDP elevated on left heart catheter catheterization. Holding on further diuresis given marginal blood pressure. 5. Elevated Troponin I:  S/P LHC 12/31. Per Cardiology. 6. Borderline Hypotension:  improved with gentle volume.  Appropriate for transfer to tele.   Georgann Housekeeper, AGACNP-BC [facility] Pulmonology/Critical Care Pager [phone] or [phone]  01/01/2016 1:18 PM

## 2016-01-01 NOTE — Progress Notes (Signed)
Pt refusing bed alarm, A/Ox4 steady on feet.

## 2016-01-01 NOTE — Care Management Note (Signed)
Case Management Note  Patient Details  Name: ASHTIN CRAWMER MRN: [id number] Date of Birth: [birth year]  Subjective/Objective:       Adm w poss pneumonia, copd exacerb             Action/Plan: lives at home   Expected Discharge Date:                  Expected Discharge Plan:     In-House Referral:     Discharge planning Services     Post Acute Care Choice:    Choice offered to:     DME Arranged:    DME Agency:     HH Arranged:    [place] Agency:     Status of Service:     Medicare Important Message Given:    Date Medicare IM Given:    Medicare IM give by:    Date Additional Medicare IM Given:    Additional Medicare Important Message give by:     If discussed at [place] of Stay Meetings, dates discussed:    Additional Comments: ur review done  Lacretia Leigh, RN 01/01/2016, 8:35 AM

## 2016-01-02 ENCOUNTER — Encounter (HOSPITAL_COMMUNITY): Payer: Self-pay | Admitting: Cardiology

## 2016-01-02 LAB — RESPIRATORY VIRUS PANEL
Adenovirus: NEGATIVE
Influenza A: POSITIVE — AB
Influenza B: NEGATIVE
Metapneumovirus: NEGATIVE
PARAINFLUENZA 1 A: NEGATIVE
PARAINFLUENZA 2 A: NEGATIVE
Parainfluenza 3: NEGATIVE
RESPIRATORY SYNCYTIAL VIRUS B: NEGATIVE
RHINOVIRUS: NEGATIVE
Respiratory Syncytial Virus A: NEGATIVE

## 2016-01-02 LAB — BASIC METABOLIC PANEL
ANION GAP: 8 (ref 5–15)
BUN: 17 mg/dL (ref 6–20)
CHLORIDE: 99 mmol/L — AB (ref 101–111)
CO2: 29 mmol/L (ref 22–32)
Calcium: 8.7 mg/dL — ABNORMAL LOW (ref 8.9–10.3)
Creatinine, Ser: 0.76 mg/dL (ref 0.61–1.24)
GFR calc non Af Amer: >60 mL/min (ref >60)
Glucose, Bld: 179 mg/dL — ABNORMAL HIGH (ref 65–99)
POTASSIUM: 4.2 mmol/L (ref 3.5–5.1)
SODIUM: 136 mmol/L (ref 135–145)

## 2016-01-02 LAB — POCT ACTIVATED CLOTTING TIME: ACTIVATED CLOTTING TIME: 172 s

## 2016-01-02 LAB — TROPONIN I: TROPONIN I: 1.2 ng/mL — AB (ref <0.031)

## 2016-01-02 MED ORDER — LEVOFLOXACIN 750 MG PO TABS
750.0000 mg | ORAL_TABLET | Freq: Every day | ORAL | Status: DC
Start: 2016-01-03 — End: 2016-01-04
  Administered 2016-01-03 – 2016-01-04 (×2): 750 mg via ORAL
  Filled 2016-01-02 (×2): qty 1

## 2016-01-02 NOTE — Progress Notes (Addendum)
PROGRESS NOTE  Patrick [name] [id number] DOB: [birth year] DOA: 12/30/2015 PCP: No primary care provider on file.  Brief Narrative: 70 y.o. M Hx HTN, HLD, COPD, and CAD with a chronically occluded RCA who presented to the ED with c/o SOB and chest pain onset the day of admit, w/ productive cough.   Work up in ED was initially suspicious for STEMI.  Patient was taken to the cath lab by Dr. Einar Gip. LHC demonstrated only the chronically occluded RCA, as well as elevated LVEDP.  CXR demonstrated pulmonary vascular congestion, and LLL infiltrate suspicious for PNA.  Subjective: - he is feeling better today, still endorsing dyspnea with activity. He has not yet worked with PT.  Assessment/Plan:  Sepsis due to LLL CAP - Acute COPD Exacerbation - Acute hypoxic resp failure  - now on 2L [place] only, but does not require O2 at home  - cont to wean as able  - complete abx course  - wean off steroids  Acute systolic CHF due to CAD - EF 40-45% via cath this admit  - no evidence of fluid overload - not on BB per cards due to hypotension and COPD - not requiring diuretic   Demand ischemia -12/31 S/P Left heart cath w/ no acute findings  - known chronic CAD w/ collaterals  - to cont to follow w/ Dr. Einar Gip as outpt   Tobacco abuse - patient has committed to stopping   Hypomagnesemia - Magnesium improved w/ replacement   Hyperglycemia - due to steroids  - check A1c to assure no occult DM, in process today   Positive influenza A - influenza flu panel negative however respiratory panel positive for influenza A. Sample sent 4 days ago and patient without significant flu like illness on admission, afebrile. There is likely no benefit from starting Tamiflu this late. Discussed with the patient and with ID over the phone.   Code Status: FULL Family Communication: no family present at time of exam Disposition Plan: PT/OT - wean O2 as able - educate on CHF   Consultants: PCCM Cards -  [place]  Procedures: [place] 12/30/15: Distal RCA filled by collaterals. Mid-RCA lesion 100% stenosed. RPDA filled by collaterals. LV EF 35-45% by visual & global hypokinesis w/ EF 40-45%. LVEDP 52mmHg.  Antibiotics: Levaquin 12/30 >  DVT prophylaxis: levaquin   Objective: Blood pressure 109/52, pulse 96, temperature 97.4 F (36.3 C), temperature source Oral, resp. rate 18, height 5\' 8"  (1.727 m), weight 59.421 kg (131 lb), SpO2 93 %.  Intake/Output Summary (Last 24 hours) at 01/02/16 1156 Last data filed at 01/02/16 1003  Gross per 24 hour  Intake   1440 ml  Output   1825 ml  Net   -385 ml   Exam: General: No acute respiratory distress at rest  Lungs: mild bibasilar crackles - no wheeze  Cardiovascular: Regular rate and rhythm without murmur gallop or rub Abdomen: Nontender, nondistended, soft, bowel sounds positive, no rebound, no ascites, no appreciable mass Extremities: No significant cyanosis, clubbing, or edema bilateral lower extremities  Data Reviewed: Basic Metabolic Panel:  Recent Labs Lab 12/30/15 0122 12/30/15 0506 12/30/15 1308 12/31/15 0200 01/02/16 0434  NA 137  --  133* 134* 136  K 3.9  --  3.7 3.8 4.2  CL 97*  --  97* 97* 99*  CO2 27  --  26 24 29   GLUCOSE 219*  --  207* 218* 179*  BUN 9  --  12 21* 17  CREATININE 0.76 0.69  0.73 1.04 0.76  CALCIUM 9.8  --  9.1 8.7* 8.7*  MG  --   --   --  1.7  --   PHOS  --   --   --  3.1  --     CBC:  Recent Labs Lab 12/30/15 0122 12/30/15 0506 12/31/15 0200 01/01/16 0238  WBC 14.4* 10.5 9.6 7.4  NEUTROABS 12.8*  --   --   --   HGB 17.1* 15.3 15.6 14.3  HCT 47.4 42.4 43.2 39.3  MCV 91.3 91.2 89.8 89.3  PLT 180 133* 139* 153    Liver Function Tests:  Recent Labs Lab 12/30/15 0122  AST 28  ALT 24  ALKPHOS 62  BILITOT 1.0  PROT 7.3  ALBUMIN 4.1   Coags:  Recent Labs Lab 12/30/15 0506  INR 1.02    Cardiac Enzymes:  Recent Labs Lab 12/30/15 0506 12/30/15 0905 12/30/15 1553  01/02/16 0434  TROPONINI 1.20* 4.77* 5.17* 1.20*    Recent Results (from the past 240 hour(s))  MRSA PCR Screening     Status: None   Collection Time: 12/30/15  3:38 AM  Result Value Ref Range Status   MRSA by PCR NEGATIVE NEGATIVE Final    Comment:        The GeneXpert MRSA Assay (FDA approved for NASAL specimens only), is one component of a comprehensive MRSA colonization surveillance program. It is not intended to diagnose MRSA infection nor to guide or monitor treatment for MRSA infections.   Culture, blood (x 2)     Status: None (Preliminary result)   Collection Time: 12/30/15  5:00 AM  Result Value Ref Range Status   Specimen Description BLOOD LEFT HAND  Final   Special Requests AEROBIC BOTTLE ONLY 5ML  Final   Culture NO GROWTH 2 DAYS  Final   Report Status PENDING  Incomplete  Culture, blood (x 2)     Status: None (Preliminary result)   Collection Time: 12/30/15  5:06 AM  Result Value Ref Range Status   Specimen Description BLOOD LEFT HAND  Final   Special Requests AEROBIC BOTTLE ONLY 5ML  Final   Culture NO GROWTH 2 DAYS  Final   Report Status PENDING  Incomplete     Studies:   Recent x-ray studies have been reviewed in detail by the Attending Physician  Scheduled Meds:  Scheduled Meds: . antiseptic oral rinse  7 mL Mouth Rinse BID  . arformoterol  15 mcg Nebulization BID  . atorvastatin  40 mg Oral q1800  . budesonide (PULMICORT) nebulizer solution  0.5 mg Nebulization BID  . carvedilol  3.125 mg Oral BID WC  . cilostazol  100 mg Oral BID  . clopidogrel  75 mg Oral Daily  . dextromethorphan-guaiFENesin  1 tablet Oral BID  . enoxaparin (LOVENOX) injection  40 mg Subcutaneous Q24H  . hydrochlorothiazide  12.5 mg Oral Daily  . Influenza vac split quadrivalent PF  0.5 mL Intramuscular Tomorrow-1000  . ipratropium-albuterol  3 mL Nebulization TID  . [START ON 01/03/2016] levofloxacin  750 mg Oral Daily  . multivitamin with minerals  1 tablet Oral Daily  .  predniSONE  30 mg Oral Q breakfast  . ramipril  2.5 mg Oral Daily  . Vitamin D (Ergocalciferol)  50,000 Units Oral Q7 days     Costin M. Cruzita Lederer, MD [facility] [phone]  If 7PM-7AM, please contact night-coverage [email or web address] Password [email or web address] 01/02/2016, 11:56 AM   LOS: 3 days

## 2016-01-02 NOTE — Progress Notes (Signed)
SATURATION QUALIFICATIONS: (This note is used to comply with regulatory documentation for home oxygen)  Patient Saturations on Room Air at Rest = 96%  Patient Saturations on Room Air while Ambulating = 99%

## 2016-01-02 NOTE — Evaluation (Signed)
Physical Therapy Evaluation Patient Details Name: Patrick [name] MRN: [id number] DOB: 1946/08/08 Today's Date: 01/02/2016   History of Present Illness  69 yo male with flu after exposure from roommate.  Troponin 1.2 from demand, LLL PNA, L heart cath  Clinical Impression  Pt was instructed to get up to walk more frequently in his room but is experiencing some low O2 sats, not low enough to present a fall risk.  His nursing staff were made aware of the change that has occurred since nursing ck'd earlier.      Follow Up Recommendations No PT follow up    Equipment Recommendations  None recommended by PT    Recommendations for Other Services       Precautions / Restrictions Precautions Precautions: Other (comment) (monitor O2 sats) Restrictions Weight Bearing Restrictions: No      Mobility  Bed Mobility Overal bed mobility: Modified Independent             General bed mobility comments: uses railing  Transfers Overall transfer level: Modified independent Equipment used: None             General transfer comment: Pt is up with no assistance but did take extra time due to illness  Ambulation/Gait Ambulation/Gait assistance: Modified independent (Device/Increase time) Ambulation Distance (Feet): 120 Feet Assistive device: None Gait Pattern/deviations: Step-through pattern;Narrow base of support Gait velocity: normal Gait velocity interpretation: at or above normal speed for age/gender General Gait Details: no pathology noted  Stairs            Wheelchair Mobility    Modified Rankin (Stroke Patients Only)       Balance Overall balance assessment: Independent                                           Pertinent Vitals/Pain Pain Assessment: No/denies pain    Home Living Family/patient expects to be discharged to:: Private residence Living Arrangements: Non-relatives/Friends Available Help at Discharge: Friend(s);Available  PRN/intermittently Type of Home: House Home Access: Level entry     Home Layout: One level Home Equipment: None      Prior Function Level of Independence: Independent               Hand Dominance        Extremity/Trunk Assessment   Upper Extremity Assessment: Overall WFL for tasks assessed           Lower Extremity Assessment: Overall WFL for tasks assessed      Cervical / Trunk Assessment: Normal  Communication   Communication: No difficulties  Cognition Arousal/Alertness: Awake/alert Behavior During Therapy: WFL for tasks assessed/performed Overall Cognitive Status: Within Functional Limits for tasks assessed                      General Comments General comments (skin integrity, edema, etc.): Pt was assessed for O2 sats and did have lower values, 93% at rest and 91% after walking.  Notified nursing of same.    Exercises        Assessment/Plan    PT Assessment Patent does not need any further PT services  PT Diagnosis Generalized weakness   PT Problem List    PT Treatment Interventions     PT Goals (Current goals can be found in the Care Plan section) Acute Rehab PT Goals Patient Stated Goal: none stated PT Goal Formulation:  All assessment and education complete, DC therapy    Frequency     Barriers to discharge        Co-evaluation               End of Session   Activity Tolerance: Other (comment) (O2 sats were low and stayed lower with gait) Patient left: in bed;with call bell/phone within reach;Other (comment) (sitting on bed) Nurse Communication: Other (comment) (O2 sats dropping with gait, pt coughing)         Time: [id number] PT Time Calculation (min) (ACUTE ONLY): 17 min   Charges:   PT Evaluation $PT Eval Low Complexity: 1 Procedure     PT G CodesRamond Dial 11-Jan-2016, 4:27 PM   Mee Hives, PT MS Acute Rehab Dept. Number: [facility] [id number] and [facility] [phone]

## 2016-01-03 DIAGNOSIS — J111 Influenza due to unidentified influenza virus with other respiratory manifestations: Secondary | ICD-10-CM

## 2016-01-03 DIAGNOSIS — I248 Other forms of acute ischemic heart disease: Secondary | ICD-10-CM

## 2016-01-03 DIAGNOSIS — I10 Essential (primary) hypertension: Secondary | ICD-10-CM

## 2016-01-03 DIAGNOSIS — I25119 Atherosclerotic heart disease of native coronary artery with unspecified angina pectoris: Secondary | ICD-10-CM

## 2016-01-03 LAB — HEMOGLOBIN A1C
HEMOGLOBIN A1C: 6.3 % — AB (ref 4.8–5.6)
MEAN PLASMA GLUCOSE: 134 mg/dL

## 2016-01-03 NOTE — Progress Notes (Signed)
[facility]                                                                              Patient Demographics  Patrick [name], is a 70 y.o. male, DOB - [birth year], [id number]  Admit date - 12/30/2015   Admitting Physician Adrian Prows, MD  Outpatient Primary MD for the patient is No primary care provider on file.  LOS - 4   Chief Complaint  Patient presents with  . Shortness of Breath       Brief HPI   70 y.o. M Hx HTN, HLD, COPD, and CAD with a chronically occluded RCA who presented to the ED with c/o SOB and chest pain onset the day of admit, w/ productive cough.  Work up in ED was initially suspicious for STEMI. Patient was taken to the cath lab by Dr. Einar Gip. LHC demonstrated only the chronically occluded RCA, as well as elevated LVEDP. CXR demonstrated pulmonary vascular congestion, and LLL infiltrate suspicious for PNA.   Assessment & Plan   Principal problem Sepsis due to LLL CAP - Acute COPD Exacerbation - Acute hypoxic resp failure  -States not feeling well this morning, otherwise back on room air, completed antibiotics course, wean off steroids.  Acute systolic CHF due to CAD - EF 40-45% via cath this admit, no evidence of fluid overload - not on BB per cards due to hypotension and COPD - not requiring diuretic   Demand ischemia -12/31 S/P Left heart cath w/ no acute findings  - known chronic CAD w/ collaterals,outpatient follow-up with Dr. Einar Gip   Tobacco abuse - patient has committed to stopping   Hyperglycemia - Likely worsened due to  steroids, Hemoglobin A1c 6.3   Positive influenza A - influenza flu panel negative however respiratory panel positive for influenza A. Sample sent 4 days ago and patient without significant flu like illness on admission, afebrile. There is likely no benefit from starting Tamiflu this late. Dr Cruzita Lederer discussed with the patient and with ID over the phone.     Code Status: full code  Family  Communication: Discussed in detail with the patient, all imaging results, lab results explained to the patient  Disposition Plan:dc in am  Time Spent in minutes   25 minutes  Procedures  Cardiac cath [facility] 12/30/15: Distal RCA filled by collaterals. Mid-RCA lesion 100% stenosed. RPDA filled by collaterals. LV EF 35-45% by visual & global hypokinesis w/ EF 40-45%. LVEDP 62mmHg.  Consults   cards  DVT Prophylaxis  Lovenox   Medications  Scheduled Meds: . antiseptic oral rinse  7 mL Mouth Rinse BID  . arformoterol  15 mcg Nebulization BID  . atorvastatin  40 mg Oral q1800  . budesonide (PULMICORT) nebulizer solution  0.5 mg Nebulization BID  . carvedilol  3.125 mg Oral BID WC  . cilostazol  100 mg Oral BID  . clopidogrel  75 mg Oral Daily  . dextromethorphan-guaiFENesin  1 tablet Oral BID  . enoxaparin (LOVENOX) injection  40 mg Subcutaneous Q24H  . hydrochlorothiazide  12.5 mg Oral Daily  . Influenza vac split quadrivalent PF  0.5 mL Intramuscular Tomorrow-1000  . ipratropium-albuterol  3 mL Nebulization TID  . levofloxacin  750 mg Oral Daily  . multivitamin with minerals  1 tablet Oral Daily  . predniSONE  30 mg Oral Q breakfast  . ramipril  2.5 mg Oral Daily  . Vitamin D (Ergocalciferol)  50,000 Units Oral Q7 days   Continuous Infusions:  PRN Meds:.acetaminophen, albuterol, benzonatate, ondansetron (ZOFRAN) IV   Antibiotics   Anti-infectives    Start     Dose/Rate Route Frequency Ordered Stop   01/03/16 1000  levofloxacin (LEVAQUIN) tablet 750 mg     750 mg Oral Daily 01/02/16 1048     12/30/15 1000  oseltamivir (TAMIFLU) capsule 75 mg  Status:  Discontinued     75 mg Oral 2 times daily 12/30/15 0347 12/30/15 1950   12/30/15 0400  levofloxacin (LEVAQUIN) IVPB 750 mg  Status:  Discontinued     750 mg 100 mL/hr over 90 Minutes Intravenous Every 24 hours 12/30/15 0326 01/02/16 1047   12/30/15 0400  vancomycin (VANCOCIN) IVPB 750 mg/150 ml premix  Status:  Discontinued      750 mg 150 mL/hr over 60 Minutes Intravenous Every 12 hours 12/30/15 0326 12/30/15 1602   12/30/15 0330  levofloxacin (LEVAQUIN) IVPB 750 mg  Status:  Discontinued     750 mg 100 mL/hr over 90 Minutes Intravenous  Once 12/30/15 0315 12/30/15 0326   12/30/15 0330  vancomycin (VANCOCIN) IVPB 1000 mg/200 mL premix  Status:  Discontinued     1,000 mg 200 mL/hr over 60 Minutes Intravenous  Once 12/30/15 0315 12/30/15 0326        Subjective:   Patrick [name] was seen and examined today.  States feeling lousy, did not sleep well. Patient denies dizziness, chest pain, no fevers or chills.  abdominal pain, N/V/D/C, new weakness, numbess, tingling. No acute events overnight.    Objective:   Blood pressure 111/68, pulse 98, temperature 98.5 F (36.9 C), temperature source Oral, resp. rate 18, height 5\' 8"  (1.727 m), weight 60.102 kg (132 lb 8 oz), SpO2 92 %.  Wt Readings from Last 3 Encounters:  01/03/16 60.102 kg (132 lb 8 oz)  08/24/15 60.873 kg (134 lb 3.2 oz)     Intake/Output Summary (Last 24 hours) at 01/03/16 1327 Last data filed at 01/03/16 1244  Gross per 24 hour  Intake   1420 ml  Output   1875 ml  Net   -455 ml    Exam  General: Alert and oriented x 3, NAD  HEENT:  PERRLA, EOMI, Anicteric Sclera, mucous membranes moist.   Neck: Supple, no JVD, no masses  CVS: S1 S2 auscultated, no rubs, murmurs or gallops. Regular rate and rhythm.  RespiratoryDecreased breath sounds at the bases with scattered rhonchi  abdomen: Soft, nontender, nondistended, + bowel sounds  Ext: no cyanosis clubbing or edema  Neuro: AAOx3, Cr N's II- XII. Strength 5/5 upper and lower extremities bilaterally  Skin: No rashes  Psych: Normal affect and demeanor, alert and oriented x3    Data Review   Micro Results Recent Results (from the past 240 hour(s))  MRSA PCR Screening     Status: None   Collection Time: 12/30/15  3:38 AM  Result Value Ref Range Status   MRSA by PCR NEGATIVE  NEGATIVE Final    Comment:        The GeneXpert MRSA Assay (FDA approved for NASAL specimens only), is one component of a comprehensive MRSA colonization surveillance program. It is not intended to diagnose MRSA infection nor to  guide or monitor treatment for MRSA infections.   Culture, blood (x 2)     Status: None (Preliminary result)   Collection Time: 12/30/15  5:00 AM  Result Value Ref Range Status   Specimen Description BLOOD LEFT HAND  Final   Special Requests AEROBIC BOTTLE ONLY 5ML  Final   Culture NO GROWTH 3 DAYS  Final   Report Status PENDING  Incomplete  Culture, blood (x 2)     Status: None (Preliminary result)   Collection Time: 12/30/15  5:06 AM  Result Value Ref Range Status   Specimen Description BLOOD LEFT HAND  Final   Special Requests AEROBIC BOTTLE ONLY 5ML  Final   Culture NO GROWTH 3 DAYS  Final   Report Status PENDING  Incomplete  Respiratory virus panel     Status: Abnormal   Collection Time: 12/31/15 11:09 AM  Result Value Ref Range Status   Respiratory Syncytial Virus A Negative Negative Final   Respiratory Syncytial Virus B Negative Negative Final   Influenza A Positive (A) Negative Final    Comment: Positive for Influenza A 2009 H1N1   Influenza B Negative Negative Final   Parainfluenza 1 Negative Negative Final   Parainfluenza 2 Negative Negative Final   Parainfluenza 3 Negative Negative Final   Metapneumovirus Negative Negative Final   Rhinovirus Negative Negative Final   Adenovirus Negative Negative Final    Comment: (NOTE) Performed At: [facility] [place], [place] [id number] Lindon Romp MD [id number]     Radiology Reports Dg Chest Port 1 View  12/31/2015  CLINICAL DATA:  Shortness of breath. History of coronary artery disease, COPD and hypertension. EXAM: PORTABLE CHEST 1 VIEW COMPARISON:  12/30/2015 FINDINGS: Stable chronic lung disease with mild hyperinflation. Minimal atelectatic changes are present  at the right lung base. No focal airspace consolidation, edema, pneumothorax or pleural fluid identified. The heart size and mediastinal contours are normal. IMPRESSION: Stable chronic lung disease. Mild atelectasis at the right lung base. No focal infiltrate identified. Electronically Signed   By: Aletta Edouard M.D.   On: 12/31/2015 09:24   Dg Chest Port 1 View  12/30/2015  CLINICAL DATA:  Acute onset of shortness of breath. Initial encounter. EXAM: PORTABLE CHEST 1 VIEW COMPARISON:  Chest radiograph from 02/03/2013 FINDINGS: The lungs are well-aerated. Mild left basilar airspace opacity may reflect atelectasis or mild pneumonia. Mild vascular congestion is noted. There is no evidence of pleural effusion or pneumothorax. The cardiomediastinal silhouette is within normal limits. No acute osseous abnormalities are seen. IMPRESSION: Mild left basilar airspace opacity may reflect atelectasis or mild pneumonia. Mild vascular congestion noted. Electronically Signed   By: Garald Balding M.D.   On: 12/30/2015 02:31    CBC  Recent Labs Lab 12/30/15 0122 12/30/15 0506 12/31/15 0200 01/01/16 0238  WBC 14.4* 10.5 9.6 7.4  HGB 17.1* 15.3 15.6 14.3  HCT 47.4 42.4 43.2 39.3  PLT 180 133* 139* 153  MCV 91.3 91.2 89.8 89.3  MCH 32.9 32.9 32.4 32.5  MCHC 36.1* 36.1* 36.1* 36.4*  RDW 12.3 12.4 12.0 11.9  LYMPHSABS 0.8  --   --   --   MONOABS 0.8  --   --   --   EOSABS 0.1  --   --   --   BASOSABS 0.0  --   --   --     Chemistries   Recent Labs Lab 12/30/15 0122 12/30/15 0506 12/30/15 1308 12/31/15 0200 01/02/16 0434  NA 137  --  133* 134* 136  K 3.9  --  3.7 3.8 4.2  CL 97*  --  97* 97* 99*  CO2 27  --  26 24 29   GLUCOSE 219*  --  207* 218* 179*  BUN 9  --  12 21* 17  CREATININE 0.76 0.69 0.73 1.04 0.76  CALCIUM 9.8  --  9.1 8.7* 8.7*  MG  --   --   --  1.7  --   AST 28  --   --   --   --   ALT 24  --   --   --   --   ALKPHOS 62  --   --   --   --   BILITOT 1.0  --   --   --   --      ------------------------------------------------------------------------------------------------------------------ estimated creatinine clearance is 74.1 mL/min (by C-G formula based on Cr of 0.76). ------------------------------------------------------------------------------------------------------------------  Recent Labs  01/02/16 0434  HGBA1C 6.3*   ------------------------------------------------------------------------------------------------------------------ No results for input(s): CHOL, HDL, LDLCALC, TRIG, CHOLHDL, LDLDIRECT in the last 72 hours. ------------------------------------------------------------------------------------------------------------------ No results for input(s): TSH, T4TOTAL, T3FREE, THYROIDAB in the last 72 hours.  Invalid input(s): FREET3 ------------------------------------------------------------------------------------------------------------------ No results for input(s): VITAMINB12, FOLATE, FERRITIN, TIBC, IRON, RETICCTPCT in the last 72 hours.  Coagulation profile  Recent Labs Lab 12/30/15 0506  INR 1.02    No results for input(s): DDIMER in the last 72 hours.  Cardiac Enzymes  Recent Labs Lab 12/30/15 0905 12/30/15 1553 01/02/16 0434  TROPONINI 4.77* 5.17* 1.20*   ------------------------------------------------------------------------------------------------------------------ Invalid input(s): POCBNP  No results for input(s): GLUCAP in the last 72 hours.   Christel Bai M.D. [facility] 01/03/2016, 1:27 PM  Pager: [phone] Between 7am to 7pm - call Pager - [phone]  After 7pm go to [email or web address] - password [email or web address]  Call night coverage person covering after 7pm

## 2016-01-04 LAB — CULTURE, BLOOD (ROUTINE X 2)
CULTURE: NO GROWTH
Culture: NO GROWTH

## 2016-01-04 MED ORDER — IPRATROPIUM-ALBUTEROL 0.5-2.5 (3) MG/3ML IN SOLN: 3.0000 mL | Freq: Three times a day (TID) | RESPIRATORY_TRACT | Status: DC

## 2016-01-04 MED ORDER — ARFORMOTEROL TARTRATE 15 MCG/2ML IN NEBU: 15.0000 ug | INHALATION_SOLUTION | Freq: Two times a day (BID) | RESPIRATORY_TRACT | Status: DC

## 2016-01-04 MED ORDER — BUDESONIDE 0.5 MG/2ML IN SUSP: 0.5000 mg | Freq: Two times a day (BID) | RESPIRATORY_TRACT | Status: DC

## 2016-01-04 MED ORDER — BENZONATATE 100 MG PO CAPS: 100.0000 mg | ORAL_CAPSULE | Freq: Three times a day (TID) | ORAL | Status: DC | PRN

## 2016-01-04 MED ORDER — ALBUTEROL SULFATE HFA 108 (90 BASE) MCG/ACT IN AERS: 2.0000 | INHALATION_SPRAY | Freq: Four times a day (QID) | RESPIRATORY_TRACT | Status: DC | PRN

## 2016-01-04 MED ORDER — LEVOFLOXACIN 750 MG PO TABS: 750.0000 mg | ORAL_TABLET | Freq: Every day | ORAL | Status: DC

## 2016-01-04 MED ORDER — DM-GUAIFENESIN ER 30-600 MG PO TB12: 1.0000 | ORAL_TABLET | Freq: Two times a day (BID) | ORAL | Status: DC

## 2016-01-04 MED ORDER — PREDNISONE 10 MG PO TABS: ORAL_TABLET | ORAL | Status: DC

## 2016-01-04 NOTE — Discharge Summary (Addendum)
Physician Discharge Summary   Patient ID: Patrick [name] MRN: [id number] DOB/AGE: 1946-12-16 70 y.o.  Admit date: 12/30/2015 Discharge date: 01/04/2016  Primary Care Physician:  No primary care provider on file.  Discharge Diagnoses:    Sepsis due to left lower lung community acquired pneumonia  Acute COPD exacerbation   acute hypoxic respiratory failure  . Left lower lobe pneumonia . CAP (community acquired pneumonia) . Coronary atherosclerosis of native coronary artery . Essential hypertension . Acute systolic CHF (congestive heart failure) ([place]) . Tobacco abuse  Consults:  Cardiology  Recommendations for Outpatient Follow-up:  Please repeat CBC/BMET at next visit Please obtain chest x-ray in 2-3 weeks to ensure complete resolution of the pneumonia    DIET:heart healthy    Allergies:   Allergies  Allergen Reactions  . Aspirin Swelling  . Cephalexin Itching  . Ibuprofen Swelling     DISCHARGE MEDICATIONS: Discharge Medication List as of 01/04/2016 11:07 AM    START taking these medications   Details  albuterol (PROVENTIL HFA;VENTOLIN HFA) 108 (90 Base) MCG/ACT inhaler Inhale 2 puffs into the lungs every 6 (six) hours as needed for wheezing or shortness of breath., Starting 01/04/2016, Until Discontinued, Print    dextromethorphan-guaiFENesin (MUCINEX DM) 30-600 MG 12hr tablet Take 1 tablet by mouth 2 (two) times daily., Starting 01/04/2016, Until Discontinued, Print    levofloxacin (LEVAQUIN) 750 MG tablet Take 1 tablet (750 mg total) by mouth daily. X 5 MORE DAYS, Starting 01/04/2016, Until Discontinued, Print    predniSONE (DELTASONE) 10 MG tablet Prednisone dosing: Take  Prednisone 30mg  (3 tabs) x 2 days, then 20mg  (2 tabs) x 3days, then 10mg  (1 tab) x 3days, then OFF., Print    arformoterol (BROVANA) 15 MCG/2ML NEBU Take 2 mLs (15 mcg total) by nebulization 2 (two) times daily., Starting 01/04/2016, Until Discontinued, Print    budesonide (PULMICORT) 0.5 MG/2ML  nebulizer solution Take 2 mLs (0.5 mg total) by nebulization 2 (two) times daily., Starting 01/04/2016, Until Discontinued, Print    ipratropium-albuterol (DUONEB) 0.5-2.5 (3) MG/3ML SOLN Take 3 mLs by nebulization 3 (three) times daily., Starting 01/04/2016, Until Discontinued, Print      CONTINUE these medications which have CHANGED   Details  benzonatate (TESSALON) 100 MG capsule Take 1 capsule (100 mg total) by mouth 3 (three) times daily as needed for cough., Starting 01/04/2016, Until Discontinued, Print      CONTINUE these medications which have NOT CHANGED   Details  atorvastatin (LIPITOR) 40 MG tablet Take 40 mg by mouth daily., Until Discontinued, Historical Med    carvedilol (COREG) 3.125 MG tablet Take 3.125 mg by mouth 2 (two) times daily with a meal., Until Discontinued, Historical Med    cilostazol (PLETAL) 100 MG tablet Take 100 mg by mouth 2 (two) times daily., Until Discontinued, Historical Med    clopidogrel (PLAVIX) 75 MG tablet Take 75 mg by mouth daily., Until Discontinued, Historical Med    hydrochlorothiazide (HYDRODIURIL) 12.5 MG tablet Take 12.5 mg by mouth daily., Until Discontinued, Historical Med    Multiple Vitamin (MULTIVITAMIN WITH MINERALS) TABS tablet Take 1 tablet by mouth daily. Centrum Silver, Until Discontinued, Historical Med    ramipril (ALTACE) 10 MG capsule Take 10 mg by mouth daily., Until Discontinued, Historical Med    Tetrahydroz-Polyvinyl Al-Povid (MURINE TEARS PLUS OP) Place 1 drop into both eyes daily., Until Discontinued, Historical Med    vitamin C (ASCORBIC ACID) 500 MG tablet Take 500 mg by mouth daily as needed (immune system boost)., Until Discontinued,  Historical Med    Vitamin D, Ergocalciferol, (DRISDOL) 50000 UNITS CAPS capsule Take 50,000 Units by mouth every 7 (seven) days. Wednesdays, Until Discontinued, Historical Med      STOP taking these medications     nitrofurantoin (MACRODANTIN) 100 MG capsule          Brief H and  P: For complete details please refer to admission H and P, but in brief 70 y.o. M Hx HTN, HLD, COPD, and CAD with a chronically occluded RCA who presented to the ED with c/o SOB and chest pain onset the day of admit, w/ productive cough.  Work up in ED was initially suspicious for STEMI. Patient was taken to the cath lab by Dr. Einar Gip. LHC demonstrated only the chronically occluded RCA, as well as elevated LVEDP. CXR demonstrated pulmonary vascular congestion, and LLL infiltrate suspicious for PNA.  Hospital Course:  Sepsis due to LLL CAP - Acute COPD Exacerbation - Acute hypoxic resp failure  -Resolved. Patient is back to his normal baseline, O2 sats 93% on room air. Patient has completed the full antibiotic course. Steroids are being weaned.  Acute systolic CHF due to CAD - EF 40-45% via cath this admit, no evidence of fluid overload - Patient initially was hypotensive, BP currently borderline soft, continue low dose coreg, ACEI.  -  patient has appointment with Dr. Einar Gip on 1/9-17, cardiac medications will be adjusted further at the follow-up appointment.   Demand ischemia -12/31 S/P Left heart cath  done with no acute findings, known chronic CAD w/ collaterals,outpatient follow-up with Dr. Einar Gip   Tobacco abuse - patient has committed to stopping   Hyperglycemia - Likely worsened due to steroids, Hemoglobin A1c 6.3   Positive influenza A - influenza flu panel negative however respiratory panel positive for influenza A. Sample sent 4 days ago and patient without significant flu like illness on admission, afebrile. There is likely no benefit from starting Tamiflu this late. Dr Cruzita Lederer discussed with the patient and with ID over the phone.    Day of Discharge BP 109/70 mmHg  Pulse 90  Temp(Src) 98.6 F (37 C) (Oral)  Resp 18  Ht 5\' 8"  (1.727 m)  Wt 58.605 kg (129 lb 3.2 oz)  BMI 19.65 kg/m2  SpO2 93%  Physical Exam: General: Alert and awake oriented x3 not in any acute  distress. HEENT: anicteric sclera, pupils reactive to light and accommodation CVS: S1-S2 clear no murmur rubs or gallops Chest: clear to auscultation bilaterally, no wheezing rales or rhonchi Abdomen: soft nontender, nondistended, normal bowel sounds Extremities: no cyanosis, clubbing or edema noted bilaterally Neuro: Cranial nerves II-XII intact, no focal neurological deficits   The results of significant diagnostics from this hospitalization (including imaging, microbiology, ancillary and laboratory) are listed below for reference.    LAB RESULTS: Basic Metabolic Panel:  Recent Labs Lab 12/31/15 0200 01/02/16 0434  NA 134* 136  K 3.8 4.2  CL 97* 99*  CO2 24 29  GLUCOSE 218* 179*  BUN 21* 17  CREATININE 1.04 0.76  CALCIUM 8.7* 8.7*  MG 1.7  --   PHOS 3.1  --    Liver Function Tests:  Recent Labs Lab 12/30/15 0122  AST 28  ALT 24  ALKPHOS 62  BILITOT 1.0  PROT 7.3  ALBUMIN 4.1   No results for input(s): LIPASE, AMYLASE in the last 168 hours. No results for input(s): AMMONIA in the last 168 hours. CBC:  Recent Labs Lab 12/30/15 0122  12/31/15 0200 01/01/16 0238  WBC 14.4*  < > 9.6 7.4  NEUTROABS 12.8*  --   --   --   HGB 17.1*  < > 15.6 14.3  HCT 47.4  < > 43.2 39.3  MCV 91.3  < > 89.8 89.3  PLT 180  < > 139* 153  < > = values in this interval not displayed. Cardiac Enzymes:  Recent Labs Lab 12/30/15 1553 01/02/16 0434  TROPONINI 5.17* 1.20*   BNP: Invalid input(s): POCBNP CBG: No results for input(s): GLUCAP in the last 168 hours.  Significant Diagnostic Studies:  Dg Chest Port 1 View  12/31/2015  CLINICAL DATA:  Shortness of breath. History of coronary artery disease, COPD and hypertension. EXAM: PORTABLE CHEST 1 VIEW COMPARISON:  12/30/2015 FINDINGS: Stable chronic lung disease with mild hyperinflation. Minimal atelectatic changes are present at the right lung base. No focal airspace consolidation, edema, pneumothorax or pleural fluid  identified. The heart size and mediastinal contours are normal. IMPRESSION: Stable chronic lung disease. Mild atelectasis at the right lung base. No focal infiltrate identified. Electronically Signed   By: Aletta Edouard M.D.   On: 12/31/2015 09:24   Dg Chest Port 1 View  12/30/2015  CLINICAL DATA:  Acute onset of shortness of breath. Initial encounter. EXAM: PORTABLE CHEST 1 VIEW COMPARISON:  Chest radiograph from 02/03/2013 FINDINGS: The lungs are well-aerated. Mild left basilar airspace opacity may reflect atelectasis or mild pneumonia. Mild vascular congestion is noted. There is no evidence of pleural effusion or pneumothorax. The cardiomediastinal silhouette is within normal limits. No acute osseous abnormalities are seen. IMPRESSION: Mild left basilar airspace opacity may reflect atelectasis or mild pneumonia. Mild vascular congestion noted. Electronically Signed   By: Garald Balding M.D.   On: 12/30/2015 02:31    Procedures    Left Heart Cath and Coronary Angiography    Conclusion    1. Abnormal EKG secondary to underlying sinus tachycardia probably due to underlying pneumonia and COPD exacerbation. 2. Occluded mid RCA stent placed in 2007, the anatomy of occluded RCA is known from 2011 with bridging collateral and also onto lateral collaterals. 3. Mild disease in the left coronary arteries. Tortuous coronary arteries. Collaterals to right coronary artery. 4. Mild diffuse global hypokinesis of the left ventricle, EF around 40-45%, hand contrast injection only performed due to elevated LVEDP.  Recommendation: Presentation consistent with noncardiac etiology. Abnormal troponin secondary to demand cardiac ischemia from tachycardia and sepsis and COPD.       Disposition and Follow-up: Discharge Instructions    Diet - low sodium heart healthy    Complete by:  As directed      Increase activity slowly    Complete by:  As directed             DISPOSITION: home    DISCHARGE  FOLLOW-UP Follow-up Information    Follow up with Adrian Prows, MD. Go on 01/08/2016.   Specialty:  Cardiology   Why:  for hospital follow-up @2 :45pm   Contact information:   [address] [place] [address] [phone]       Follow up with Marshell Garfinkel, MD. Go on 01/08/2016.   Specialty:  Pulmonary Disease   Why:  @11 ;15am   Contact information:   [address] [place] [address] [phone]        Time spent on Discharge: 19mins   Signed:   Jalayna Josten M.D. [facility] 01/04/2016, 4:59 PM Pager: [phone]   01/04/2016, 4:59 PM Pager: [phone]  Addendum: I called patient's pharmacy and spoke to the pharmacist. I efaxed his nebs again  with ICD codes and told the pharmacist to relay to the patient to stop HCTZ.    Ethelwyn Gilbertson M.D. [facility] 01/04/2016, 5:01 PM  Pager: [phone]   Addendum: Received a call again, I sent the prescription for his inhalers again to his pharmacy. Told the RN to relay to the patient that if this does not go through again, patient needs to come back to the floor to pick up the hard scripts.    Zyanne Schumm M.D. [facility] 01/06/2016, 3:34 PM  Pager: [phone]

## 2016-01-04 NOTE — Care Management Note (Signed)
Case Management Note  Patient Details  Name: Patrick [name] MRN: [id number] Date of Birth: July 04, 1946  Subjective/Objective:   Admitted with Pneumonia                 Action/Plan: Patient is independent prior to admission, has private insurance with BCBS with prescription drug coverage; no problem getting his medication. No PCP, patient goes to Urgent Care. CM talked to patient about the importance of having a PCP, patient is thinking about going to [facility]. Nebulizer machine ordered through [facility].  Expected Discharge Date:    01/06/2016              Expected Discharge Plan:  Home/Self Care  Discharge planning Services  CM Consult  Choice offered to:  Patient  DME Arranged:  Nebulizer machine DME Agency:  [facility].  Status of Service:  Completed, signed off  Sherrilyn Rist [phone] 01/04/2016, 12:01 PM

## 2016-01-06 MED ORDER — IPRATROPIUM-ALBUTEROL 0.5-2.5 (3) MG/3ML IN SOLN: 3.0000 mL | Freq: Three times a day (TID) | RESPIRATORY_TRACT | Status: DC

## 2016-01-06 MED ORDER — ARFORMOTEROL TARTRATE 15 MCG/2ML IN NEBU: 15.0000 ug | INHALATION_SOLUTION | Freq: Two times a day (BID) | RESPIRATORY_TRACT | Status: DC

## 2016-01-06 MED ORDER — BUDESONIDE 0.5 MG/2ML IN SUSP: 0.5000 mg | Freq: Two times a day (BID) | RESPIRATORY_TRACT | Status: DC

## 2016-01-07 MED ORDER — IPRATROPIUM-ALBUTEROL 0.5-2.5 (3) MG/3ML IN SOLN: 3.0000 mL | Freq: Three times a day (TID) | RESPIRATORY_TRACT | Status: DC

## 2016-01-07 MED ORDER — ARFORMOTEROL TARTRATE 15 MCG/2ML IN NEBU: 15.0000 ug | INHALATION_SOLUTION | Freq: Two times a day (BID) | RESPIRATORY_TRACT | Status: DC

## 2016-01-07 MED ORDER — BUDESONIDE 0.5 MG/2ML IN SUSP: 0.5000 mg | Freq: Two times a day (BID) | RESPIRATORY_TRACT | Status: DC

## 2016-01-08 ENCOUNTER — Inpatient Hospital Stay: Payer: Medicare Other | Admitting: Pulmonary Disease

## 2016-01-30 ENCOUNTER — Encounter: Payer: Self-pay | Admitting: Internal Medicine

## 2016-01-30 ENCOUNTER — Ambulatory Visit (INDEPENDENT_AMBULATORY_CARE_PROVIDER_SITE_OTHER)
Admission: RE | Admit: 2016-01-30 | Discharge: 2016-01-30 | Disposition: A | Discharge status: Home / Self Care | Payer: Medicare Other | Source: Ambulatory Visit | Attending: Internal Medicine | Admitting: Internal Medicine

## 2016-01-30 ENCOUNTER — Ambulatory Visit (INDEPENDENT_AMBULATORY_CARE_PROVIDER_SITE_OTHER): Payer: Medicare Other | Admitting: Internal Medicine

## 2016-01-30 ENCOUNTER — Other Ambulatory Visit (INDEPENDENT_AMBULATORY_CARE_PROVIDER_SITE_OTHER): Payer: Medicare Other

## 2016-01-30 VITALS — BP 112/76 | HR 91 | Ht 68.0 in | Wt 135.4 lb

## 2016-01-30 DIAGNOSIS — I10 Essential (primary) hypertension: Secondary | ICD-10-CM | POA: Diagnosis not present

## 2016-01-30 DIAGNOSIS — J449 Chronic obstructive pulmonary disease, unspecified: Secondary | ICD-10-CM

## 2016-01-30 DIAGNOSIS — R058 Other specified cough: Secondary | ICD-10-CM

## 2016-01-30 DIAGNOSIS — R05 Cough: Secondary | ICD-10-CM

## 2016-01-30 LAB — CBC WITH DIFFERENTIAL/PLATELET
BASOS ABS: 0 10*3/uL (ref 0.0–0.1)
Basophils Relative: 0.5 % (ref 0.0–3.0)
EOS PCT: 4 % (ref 0.0–5.0)
Eosinophils Absolute: 0.3 10*3/uL (ref 0.0–0.7)
HEMATOCRIT: 47.5 % (ref 39.0–52.0)
HEMOGLOBIN: 16 g/dL (ref 13.0–17.0)
LYMPHS PCT: 13.6 % (ref 12.0–46.0)
Lymphs Abs: 1 10*3/uL (ref 0.7–4.0)
MCHC: 33.6 g/dL (ref 30.0–36.0)
MCV: 95.3 fl (ref 78.0–100.0)
MONOS PCT: 12.5 % — AB (ref 3.0–12.0)
Monocytes Absolute: 0.9 10*3/uL (ref 0.1–1.0)
NEUTROS PCT: 69.4 % (ref 43.0–77.0)
Neutro Abs: 5 10*3/uL (ref 1.4–7.7)
Platelets: 222 10*3/uL (ref 150.0–400.0)
RBC: 4.98 Mil/uL (ref 4.22–5.81)
RDW: 13.5 % (ref 11.5–15.5)
WBC: 7.2 10*3/uL (ref 4.0–10.5)

## 2016-01-30 LAB — BASIC METABOLIC PANEL
BUN: 9 mg/dL (ref 6–23)
CALCIUM: 10 mg/dL (ref 8.4–10.5)
CO2: 31 mEq/L (ref 19–32)
Chloride: 98 mEq/L (ref 96–112)
Creatinine, Ser: 0.77 mg/dL (ref 0.40–1.50)
GFR: 106.19 mL/min (ref >60.00)
Glucose, Bld: 172 mg/dL — ABNORMAL HIGH (ref 70–99)
POTASSIUM: 4.1 meq/L (ref 3.5–5.1)
SODIUM: 137 meq/L (ref 135–145)

## 2016-01-30 LAB — HEMOGLOBIN A1C: HEMOGLOBIN A1C: 6.7 % — AB (ref 4.6–6.5)

## 2016-01-30 NOTE — Assessment & Plan Note (Addendum)
Classic Upper airway cough syndrome, so named because it's frequently impossible to sort out how much is  CR/sinusitis with freq throat clearing (which can be related to primary GERD)   vs  causing  secondary (" extra esophageal")  GERD from wide swings in gastric pressure that occur with throat clearing, often  promoting self use of mint and menthol lozenges that reduce the lower esophageal sphincter tone and exacerbate the problem further in a cyclical fashion.   These are the same pts (now being labeled as having "irritable larynx syndrome" by some cough centers) who not infrequently have a history of having failed to tolerate ace inhibitors,  dry powder inhalers or biphosphonates or report having atypical reflux symptoms that don't respond to standard doses of PPI , and are easily confused as having aecopd or asthma flares by even experienced allergists/ pulmonologists.   For now try off acei and on gerd diet and regroup in 6 weeks   I had an extended discussion with the patient reviewing all relevant studies completed to date and  lasting35 minutes of a 60 minute new pt/ transition of care office visit    Each maintenance medication was reviewed in detail including most importantly the difference between maintenance and prns and under what circumstances the prns are to be triggered using an action plan format that is not reflected in the computer generated alphabetically organized AVS.    Please see instructions for details which were reviewed in writing and the patient given a copy highlighting the part that I personally wrote and discussed at today's ov.

## 2016-01-30 NOTE — Assessment & Plan Note (Signed)
Off acei mid jan 2017 per Dr Nadyne Coombes  Although even in retrospect it may not be clear the ACEi contributed to the pt's symptoms,  Pt improving somewhat off them and adding them back at this point or in the future would risk confusion in interpretation of non-specific respiratory symptoms to which this patient is prone  ie  Better not to muddy the waters here.   Pt not sure of meds > will call when gets home with any discrepancy and to tell us what his new bp med is

## 2016-01-30 NOTE — Assessment & Plan Note (Signed)
Baseline still able to push mower in Nov 2016 and stopped smoking Dec 2016 so doubt he has severe copd > rec duoneb pending f/u with pfts in 6 weeks   NB   When respiratory symptoms begin or become refractory well after a patient reports complete smoking cessation,  Especially when this wasn't the case while they were smoking, a red flag is raised based on the work of Dr Kris Mouton which states:  if you quit smoking when your best day FEV1 is still well preserved it is highly unlikely you will progress to severe disease.  That is to say, once the smoking stops,  the symptoms should not suddenly erupt or markedly worsen.  If so, the differential diagnosis should include  obesity/deconditioning,  LPR/Reflux/Aspiration syndromes,  occult CHF(LVEDP =29 qualifies him), or  especially side effect of medications commonly used in this population, esp ACEi which was just stopped a week or two prior to OV    Will see back for pft's and decide what if anything he needs to do other than remain off cigs and all ACEi's

## 2016-01-30 NOTE — Progress Notes (Signed)
Subjective:     Patient ID: Patrick [name], male   DOB: 1946-02-21,    MRN: [id number]  HPI  1 yowm quit smoking on admit Dec 31 2015   Admit date: 12/30/2015 Discharge date: 01/04/2016  Discharge Diagnoses:  Acute COPD exacerbation  acute hypoxic respiratory failure  . Left lower lobe pneumonia . CAP (community acquired pneumonia) . Coronary atherosclerosis of native coronary artery . Essential hypertension . Acute systolic CHF (congestive heart failure) ([place]) with LVEDP 29 at cath 12/30/15  . Tobacco abuse  Consults: Cardiology  Recommendations for Outpatient Follow-up:  Please repeat CBC/BMET at next visit Please obtain chest x-ray in 2-3 weeks to ensure complete resolution of the pneumonia    01/30/2016 1st [facility] office visit/ Lan Mcneill   Chief Complaint  Patient presents with  . HFU    Pt states that his breathing has improved, but not back to baseline. He has occ cough- sometimes prod with clear sputum. He has had occ chest tightness.   last feeling good in Nov 2016 > able to do push mower, also limited by pain R better since bypass but still with exertion  stopped altace 2 weeks prior to OV  p bp too low per Dr Nadyne Coombes does not know what he replaced it with if anything and very confused with details of care but using multiple different when at baseline didn't need any / still feeling "throat and chest congestion" but can't cough anything up to clear it -grabs in neck at sternal notch to show where feels mucus is stuck   No obvious day to day or daytime variability or assoc excess/ purulent sputum or mucus plugs   cp or chest tightness, subjective wheeze or overt sinus or hb symptoms. No unusual exp hx or h/o childhood pna/ asthma or knowledge of premature birth.  Sleeping ok without nocturnal  or early am exacerbation  of respiratory  c/o's or need for noct saba. Also denies any obvious fluctuation of symptoms with weather or environmental changes or other  aggravating or alleviating factors except as outlined above   Current Medications, Allergies, Complete Past Medical History, Past Surgical History, Family History, and Social History were reviewed in [employer] record.  ROS  The following are not active complaints unless bolded sore throat, dysphagia, dental problems, itching, sneezing,  nasal congestion or excess/ purulent secretions, ear ache,   fever, chills, sweats, unintended wt loss, classically pleuritic or exertional cp, hemoptysis,  orthopnea pnd or leg swelling, presyncope, palpitations, abdominal pain, anorexia, nausea, vomiting, diarrhea  or change in bowel or bladder habits, change in stools or urine, dysuria,hematuria,  rash, arthralgias, visual complaints, headache, numbness, weakness or ataxia or problems with walking or coordination,  change in mood/affect or memory.           Review of Systems     Objective:   Physical Exam   amb wm with vigorous throat clearing     Wt Readings from Last 3 Encounters:  01/30/16 135 lb 6.4 oz (61.417 kg)  01/04/16 129 lb 3.2 oz (58.605 kg)  08/24/15 134 lb 3.2 oz (60.873 kg)    Vital signs reviewed   HEENT: nl dentition, turbinates, and oropharynx. Nl external ear canals without cough reflex   NECK :  without JVD/Nodes/TM/ nl carotid upstrokes bilaterally   LUNGS: no acc muscle use,  slt barrel contour chest with mild increased Texp/ disant bs bilaterally    CV:  RRR  no s3 or murmur or  increase in P2, no edema   ABD:  soft and nontender with late insp hoover's sign  in the supine position. No bruits or organomegaly, bowel sounds nl  MS:  Nl gait/ ext warm without deformities, calf tenderness, cyanosis or clubbing No obvious joint restrictions   SKIN: warm and dry without lesions    NEURO:  alert, approp, nl sensorium with  no motor deficits   CXR PA and Lateral:   01/30/2016 :    I personally reviewed images and agree with radiology  impression as follows:     COPD. Chronic interstitial prominence likely reflecting the patient's smoking history. There is no evidence of pneumonia nor other acute cardiopulmonary disease.    Labs ordered/ reviewed:      Chemistry      Component Value Date/Time   NA 137 01/30/2016 1230   K 4.1 01/30/2016 1230   CL 98 01/30/2016 1230   CO2 31 01/30/2016 1230   BUN 9 01/30/2016 1230   CREATININE 0.77 01/30/2016 1230      Component Value Date/Time   CALCIUM 10.0 01/30/2016 1230   ALKPHOS 62 12/30/2015 0122   AST 28 12/30/2015 0122   ALT 24 12/30/2015 0122   BILITOT 1.0 12/30/2015 0122        Lab Results  Component Value Date   WBC 7.2 01/30/2016   HGB 16.0 01/30/2016   HCT 47.5 01/30/2016   MCV 95.3 01/30/2016   PLT 222.0 01/30/2016                Assessment:

## 2016-01-30 NOTE — Patient Instructions (Addendum)
Change nebulizer to where you take the duoneb (ipatropium/albuterol) up to every 6 hours only if needed (stop all other inhalers/ nebs)  Call us back with any discrepancies on your med list today   Please remember to go to the lab and x-ray department downstairs for your tests - we will call you with the results when they are available.  GERD (REFLUX)  is an extremely common cause of respiratory symptoms just like yours , many times with no obvious heartburn at all.    It can be treated with medication, but also with lifestyle changes including elevation of the head of your bed (ideally with 6 inch  bed blocks),  Smoking cessation, avoidance of late meals, excessive alcohol, and avoid fatty foods, chocolate, peppermint, colas, red wine, and acidic juices such as orange juice.  NO MINT OR MENTHOL PRODUCTS SO NO COUGH DROPS  USE SUGARLESS CANDY INSTEAD (Jolley ranchers or Stover's or Life Savers) or even ice chips will also do - the key is to swallow to prevent all throat clearing. NO OIL BASED VITAMINS - use powdered substitutes.    Please schedule a follow up office visit in 6 weeks, call sooner if needed with pfts

## 2016-01-30 NOTE — Progress Notes (Signed)
Quick Note:  Spoke with pt and notified of results per Dr. Wert. Pt verbalized understanding and denied any questions.  ______ 

## 2016-02-09 ENCOUNTER — Encounter (HOSPITAL_COMMUNITY): Payer: Medicare Other

## 2016-02-11 DIAGNOSIS — J449 Chronic obstructive pulmonary disease, unspecified: Secondary | ICD-10-CM | POA: Diagnosis not present

## 2016-02-15 DIAGNOSIS — H5201 Hypermetropia, right eye: Secondary | ICD-10-CM | POA: Diagnosis not present

## 2016-02-15 DIAGNOSIS — H353221 Exudative age-related macular degeneration, left eye, with active choroidal neovascularization: Secondary | ICD-10-CM | POA: Diagnosis not present

## 2016-02-15 DIAGNOSIS — H2513 Age-related nuclear cataract, bilateral: Secondary | ICD-10-CM | POA: Diagnosis not present

## 2016-02-15 DIAGNOSIS — H52221 Regular astigmatism, right eye: Secondary | ICD-10-CM | POA: Diagnosis not present

## 2016-02-20 ENCOUNTER — Telehealth: Payer: Self-pay | Admitting: Family Medicine

## 2016-02-20 DIAGNOSIS — H353221 Exudative age-related macular degeneration, left eye, with active choroidal neovascularization: Secondary | ICD-10-CM | POA: Diagnosis not present

## 2016-02-20 DIAGNOSIS — H353111 Nonexudative age-related macular degeneration, right eye, early dry stage: Secondary | ICD-10-CM | POA: Diagnosis not present

## 2016-02-20 DIAGNOSIS — H35373 Puckering of macula, bilateral: Secondary | ICD-10-CM | POA: Diagnosis not present

## 2016-02-21 DIAGNOSIS — I10 Essential (primary) hypertension: Secondary | ICD-10-CM | POA: Diagnosis not present

## 2016-02-21 DIAGNOSIS — F17200 Nicotine dependence, unspecified, uncomplicated: Secondary | ICD-10-CM | POA: Diagnosis not present

## 2016-02-21 DIAGNOSIS — R0602 Shortness of breath: Secondary | ICD-10-CM | POA: Diagnosis not present

## 2016-02-24 ENCOUNTER — Ambulatory Visit (INDEPENDENT_AMBULATORY_CARE_PROVIDER_SITE_OTHER): Payer: Medicare Other

## 2016-02-24 ENCOUNTER — Ambulatory Visit (INDEPENDENT_AMBULATORY_CARE_PROVIDER_SITE_OTHER): Payer: Medicare Other | Admitting: Emergency Medicine

## 2016-02-24 VITALS — BP 152/90 | HR 82 | Temp 97.7°F | Resp 20 | Ht 68.5 in | Wt 139.6 lb

## 2016-02-24 DIAGNOSIS — M5010 Cervical disc disorder with radiculopathy, unspecified cervical region: Secondary | ICD-10-CM

## 2016-02-24 DIAGNOSIS — M25511 Pain in right shoulder: Secondary | ICD-10-CM

## 2016-02-24 DIAGNOSIS — M5032 Other cervical disc degeneration, mid-cervical region, unspecified level: Secondary | ICD-10-CM | POA: Diagnosis not present

## 2016-02-24 MED ORDER — GABAPENTIN 100 MG PO CAPS: ORAL_CAPSULE | ORAL | Status: DC

## 2016-02-24 MED ORDER — PREDNISONE 10 MG PO TABS: ORAL_TABLET | ORAL | Status: DC

## 2016-02-24 NOTE — Progress Notes (Addendum)
This chart was scribed for Nena Jordan, MD by [facility], medical scribe at [facility].The patient was seen in exam room 12 and the patient's care was started at 2:53 PM.  Chief Complaint:  Chief Complaint  Patient presents with  . Shoulder Pain    right shoulder , sometime in neck x 1 week   HPI: Patrick [name] is a 70 y.o. male who reports to [facility] today complaining of pain in his right shoulder which has been ongoing for one week. Pain is worse at night and will radiate up his neck and down his arm. Hot and ice packs for relief. No weakness or numbness in his hand or wrist.  No strenuous activity recently due to CHF.  Past Medical History  Diagnosis Date  . Hyperlipidemia   . Hypertension   . CAD (coronary artery disease)   . COPD (chronic obstructive pulmonary disease) [facility])    Past Surgical History  Procedure Laterality Date  . Cardiac catheterization N/A 12/30/2015    Procedure: Left Heart Cath and Coronary Angiography;  Surgeon: Adrian Prows, MD;  Location: [place] [facility];  Service: Cardiovascular;  Laterality: N/A;   Social History   Social History  . Marital Status: Single    Spouse Name: N/A  . Number of Children: N/A  . Years of Education: N/A   Social History Main Topics  . Smoking status: Former Smoker -- 1.25 packs/day for 39 years    Types: Cigarettes    Quit date: 12/31/2015  . Smokeless tobacco: Never Used  . Alcohol Use: No  . Drug Use: No  . Sexual Activity: Not Asked   Other Topics Concern  . None   Social History Narrative   Family History  Problem Relation Age of Onset  . Cancer Mother   . Cancer Father   . Diabetes Sister   . Diabetes Brother    Allergies  Allergen Reactions  . Aspirin Swelling  . Cephalexin Itching  . Ibuprofen Swelling   Prior to Admission medications   Medication Sig Start Date End Date Taking? Authorizing Provider  atorvastatin (LIPITOR) 40 MG tablet Take 40 mg by mouth daily.   Yes  Historical Provider, MD  carvedilol (COREG) 3.125 MG tablet Take 3.125 mg by mouth 2 (two) times daily with a meal.   Yes Historical Provider, MD  cilostazol (PLETAL) 100 MG tablet Take 100 mg by mouth 2 (two) times daily.   Yes Historical Provider, MD  clopidogrel (PLAVIX) 75 MG tablet Take 75 mg by mouth daily.   Yes Historical Provider, MD  Multiple Vitamin (MULTIVITAMIN WITH MINERALS) TABS tablet Take 1 tablet by mouth daily. Centrum Silver   Yes Historical Provider, MD  Tetrahydroz-Polyvinyl Al-Povid (MURINE TEARS PLUS OP) Place 1 drop into both eyes daily.   Yes Historical Provider, MD  valsartan (DIOVAN) 80 MG tablet Take 80 mg by mouth daily.   Yes Historical Provider, MD  vitamin C (ASCORBIC ACID) 500 MG tablet Take 500 mg by mouth daily as needed (immune system boost).   Yes Historical Provider, MD  Vitamin D, Ergocalciferol, (DRISDOL) 50000 UNITS CAPS capsule Take 50,000 Units by mouth every 7 (seven) days. Wednesdays   Yes Historical Provider, MD  dextromethorphan-guaiFENesin (MUCINEX DM) 30-600 MG 12hr tablet Take 1 tablet by mouth 2 (two) times daily. Patient not taking: Reported on 02/24/2016 01/04/16   Ripudeep Krystal Eaton, MD    ROS: The patient denies fevers, chills, night sweats, unintentional weight  loss, chest pain, palpitations, wheezing, dyspnea on exertion, nausea, vomiting, abdominal pain, dysuria, hematuria, melena, numbness, weakness, or tingling.  All other systems have been reviewed and were otherwise negative with the exception of those mentioned in the HPI and as above.    PHYSICAL EXAM: Filed Vitals:   02/24/16 1417  BP: 152/90  Pulse: 82  Temp: 97.7 F (36.5 C)  Resp: 20   Body mass index is 20.91 kg/(m^2).  General: Alert, no acute distress HEENT:  Normocephalic, atraumatic, oropharynx patent. Eye: Juliette Mangle [facility] Cardiovascular:  Regular rate and rhythm, no rubs murmurs or gallops.  No Carotid bruits, radial pulse intact. No pedal edema.  Respiratory: Clear to  auscultation bilaterally.  No wheezes, rales, or rhonchi.  No cyanosis, no use of accessory musculature Abdominal: No organomegaly, abdomen is soft and non-tender, positive bowel sounds.  No masses. Musculoskeletal: Gait intact. No edema, tenderness Skin: No rashes. Neurologic: Facial musculature symmetric. Psychiatric: Patient acts appropriately throughout our interaction. Lymphatic: No cervical or submandibular lymphadenopathy Genitourinary/Anorectal: No acute findings Meds ordered this encounter  Medications  . predniSONE (DELTASONE) 10 MG tablet    Sig: Take 4 day for 3 days 3 a day for 3 days 2 a day for 3 days one a day for 3 days    Dispense:  30 tablet    Refill:  0  . gabapentin (NEURONTIN) 100 MG capsule    Sig: Take 1-2 capsules up to 3 times a day as needed for shoulder pain    Dispense:  90 capsule    Refill:  3   LABS:   EKG/XRAY:   Primary read interpreted by Dr. Everlene Farrier at [facility]. Dg Chest 2 View  01/30/2016  CLINICAL DATA:  Follow-up of pneumonia, currently asymptomatic, history of COPD, CHF, Coronary artery disease. Recently discontinued smoking. EXAM: CHEST  2 VIEW COMPARISON:  Portable chest x-ray of December 31, 2015 FINDINGS: The lungs remain mildly hyperinflated. The interstitial markings are diffusely increased. There is no focal infiltrate. There is no pleural effusion. The heart and pulmonary vascularity are normal. The mediastinum is normal in width. The bony thorax exhibits no acute abnormality. IMPRESSION: COPD. Chronic interstitial prominence likely reflecting the patient's smoking history. There is no evidence of pneumonia nor other acute cardiopulmonary disease. Electronically Signed   By: David  Martinique M.D.   On: 01/30/2016 13:04   Dg Cervical Spine Complete  02/24/2016  CLINICAL DATA:  Right side neck pain for 1 week.  No known injury. EXAM: CERVICAL SPINE - COMPLETE 4+ VIEW COMPARISON:  None. FINDINGS: Degenerative disc and facet disease throughout the cervical  spine. Disc space narrowing and early spurring. Prevertebral soft tissues and alignment are normal. No fracture. IMPRESSION: Diffuse spondylosis.  No acute findings. Electronically Signed   By: Rolm Baptise M.D.   On: 02/24/2016 15:45   Dg Shoulder Right  02/24/2016  CLINICAL DATA:  Right shoulder pain ongoing for 1 week. EXAM: RIGHT SHOULDER - 2+ VIEW COMPARISON:  01/30/2016 FINDINGS: There is no evidence of fracture or dislocation. There is no evidence of arthropathy or other focal bone abnormality. Soft tissues are unremarkable. IMPRESSION: Negative. Electronically Signed   By: Rolm Baptise M.D.   On: 02/24/2016 15:43   ASSESSMENT/PLAN: Not clear why he is having so much pain in his neck and shoulder. I'm worried he could have an arthropathy related to his high-dose Levaquin he received for pneumonia. He also has significant neck to disease on C-spine film. We'll just treat with a short taper prednisone  in gabapentin 100 mg 1-23 times a day during the day.Marland Kitchen Referral made to orthopedics for their opinion. We'll go ahead and schedule an MRI of the neck.I personally performed the services described in this documentation, which was scribed in my presence. The recorded information has been reviewed and is accurate. Gross sideeffects, risk and benefits, and alternatives of medications d/w patient. Patient is aware that all medications have potential sideeffects and we are unable to predict every sideeffect or drug-drug interaction that may occur.  By signing my name below, I, Nadim Abuhashem, attest that this documentation has been prepared under the direction and in the presence of Nena Jordan, MD.  Electronically Signed: Lora Havens, medical scribe. 02/24/2016, 2:53 PM.  Arlyss Queen MD 02/24/2016 2:53 PM

## 2016-02-24 NOTE — Patient Instructions (Addendum)
  I have made a referral for you to an orthopedist You will be on prednisone for inflammation in the neck and shoulder I have scheduled you for an MRI.   Because you received an x-ray today, you will receive an invoice from [facility]. Please contact [facility] at [phone] with questions or concerns regarding your invoice. Our billing staff will not be able to assist you with those questions.

## 2016-02-27 DIAGNOSIS — H353221 Exudative age-related macular degeneration, left eye, with active choroidal neovascularization: Secondary | ICD-10-CM | POA: Diagnosis not present

## 2016-03-04 DIAGNOSIS — I10 Essential (primary) hypertension: Secondary | ICD-10-CM | POA: Diagnosis not present

## 2016-03-06 ENCOUNTER — Ambulatory Visit
Admission: RE | Admit: 2016-03-06 | Discharge: 2016-03-06 | Disposition: A | Discharge status: Home / Self Care | Payer: Medicare Other | Source: Ambulatory Visit | Attending: Emergency Medicine | Admitting: Emergency Medicine

## 2016-03-06 DIAGNOSIS — M50221 Other cervical disc displacement at C4-C5 level: Secondary | ICD-10-CM | POA: Diagnosis not present

## 2016-03-06 DIAGNOSIS — M5010 Cervical disc disorder with radiculopathy, unspecified cervical region: Secondary | ICD-10-CM

## 2016-03-07 ENCOUNTER — Other Ambulatory Visit: Payer: Self-pay | Admitting: Emergency Medicine

## 2016-03-07 DIAGNOSIS — M5010 Cervical disc disorder with radiculopathy, unspecified cervical region: Secondary | ICD-10-CM

## 2016-03-08 ENCOUNTER — Ambulatory Visit (HOSPITAL_COMMUNITY)
Admission: RE | Admit: 2016-03-08 | Discharge: 2016-03-08 | Disposition: A | Discharge status: Home / Self Care | Payer: Medicare Other | Source: Ambulatory Visit | Attending: Internal Medicine | Admitting: Internal Medicine

## 2016-03-08 ENCOUNTER — Ambulatory Visit (INDEPENDENT_AMBULATORY_CARE_PROVIDER_SITE_OTHER): Payer: Medicare Other | Admitting: Internal Medicine

## 2016-03-08 ENCOUNTER — Encounter: Payer: Self-pay | Admitting: Internal Medicine

## 2016-03-08 VITALS — BP 120/68 | HR 62 | Ht 68.5 in | Wt 141.0 lb

## 2016-03-08 DIAGNOSIS — R058 Other specified cough: Secondary | ICD-10-CM

## 2016-03-08 DIAGNOSIS — J449 Chronic obstructive pulmonary disease, unspecified: Secondary | ICD-10-CM | POA: Diagnosis not present

## 2016-03-08 DIAGNOSIS — I10 Essential (primary) hypertension: Secondary | ICD-10-CM

## 2016-03-08 DIAGNOSIS — R05 Cough: Secondary | ICD-10-CM

## 2016-03-08 LAB — PULMONARY FUNCTION TEST
DL/VA % PRED: 62 %
DL/VA: 2.78 ml/min/mmHg/L
DLCO UNC: 12.71 ml/min/mmHg
DLCO unc % pred: 42 %
FEF 25-75 POST: 0.83 L/s
FEF 25-75 Pre: 0.58 L/sec
FEF2575-%CHANGE-POST: 43 %
FEF2575-%PRED-POST: 36 %
FEF2575-%Pred-Pre: 25 %
FEV1-%CHANGE-POST: 8 %
FEV1-%PRED-PRE: 43 %
FEV1-%Pred-Post: 47 %
FEV1-POST: 1.42 L
FEV1-Pre: 1.32 L
FEV1FVC-%CHANGE-POST: -12 %
FEV1FVC-%PRED-PRE: 75 %
FEV6-%Change-Post: 19 %
FEV6-%Pred-Post: 67 %
FEV6-%Pred-Pre: 56 %
FEV6-PRE: 2.19 L
FEV6-Post: 2.61 L
FEV6FVC-%CHANGE-POST: -4 %
FEV6FVC-%PRED-PRE: 100 %
FEV6FVC-%Pred-Post: 96 %
FVC-%CHANGE-POST: 23 %
FVC-%PRED-POST: 70 %
FVC-%PRED-PRE: 57 %
FVC-PRE: 2.35 L
FVC-Post: 2.9 L
POST FEV1/FVC RATIO: 49 %
PRE FEV6/FVC RATIO: 94 %
Post FEV6/FVC ratio: 90 %
Pre FEV1/FVC ratio: 56 %
RV % PRED: 213 %
RV: 5 L
TLC % pred: 115 %
TLC: 7.65 L

## 2016-03-08 MED ORDER — ALBUTEROL SULFATE (2.5 MG/3ML) 0.083% IN NEBU
2.5000 mg | INHALATION_SOLUTION | Freq: Once | RESPIRATORY_TRACT | Status: AC
  Administered 2016-03-08: 2.5 mg via RESPIRATORY_TRACT

## 2016-03-08 NOTE — Assessment & Plan Note (Signed)
D/c acei mid jan 2017   Still some excess throat clearing > rec otcs/hard rock candy

## 2016-03-08 NOTE — Patient Instructions (Addendum)
You barely have a GOLD III copd, should not progress unless you resume smoking   Pulmonary follow up is as needed for worse breathing or cough

## 2016-03-08 NOTE — Assessment & Plan Note (Addendum)
Quit smoking 12/2015 - PFT's  03/08/2016  FEV1 1.42 (47 % ) ratio 49  p 8 % improvement from saba with DLCO  42 % corrects to 69 % for alv volume    I had an extended final summary discussion with the patient reviewing all relevant studies completed to date and  lasting 15 to 20 minutes of a 25 minute visit on the following issues:       1) reviewed the Fletcher curve with the patient that basically indicates  if you quit smoking when your best day FEV1 is still well preserved (as is only relatively  the case here)  it is highly unlikely you will progress to severe disease and informed the patient there was no medication on the market that has proven to alter the curve/ its downward trajectory  or the likelihood of progression of their disease.  Therefore stopping smoking and maintaining abstinence is the most important aspect of care, not choice of inhalers or for that matter, doctors.     2) pulmonary f/u can be prn limiting sob or cough

## 2016-03-08 NOTE — Progress Notes (Signed)
Subjective:     Patient ID: Patrick [name], male   DOB: 03/28/1946,    MRN: [id number]    Brief patient profile:  1 yowm quit smoking on admit Dec 31 2015   Admit date: 12/30/2015 Discharge date: 01/04/2016  Discharge Diagnoses:  Acute COPD exacerbation  acute hypoxic respiratory failure  . Left lower lobe pneumonia . CAP (community acquired pneumonia) . Coronary atherosclerosis of native coronary artery . Essential hypertension . Acute systolic CHF (congestive heart failure) ([place]) with LVEDP 29 at cath 12/30/15  . Tobacco abuse  Consults: Cardiology  Recommendations for Outpatient Follow-up:  Please repeat CBC/BMET at next visit Please obtain chest x-ray in 2-3 weeks to ensure complete resolution of the pneumonia   History of Present Illness  01/30/2016 1st [place] [facility] office visit/ Patrick [name]   Chief Complaint  Patient presents with  . HFU    Pt states that his breathing has improved, but not back to baseline. He has occ cough- sometimes prod with clear sputum. He has had occ chest tightness.   last feeling good in Nov 2016 > able to do push mower, also limited by pain R better since bypass but still with exertion  stopped altace 2 weeks prior to OV  p bp too low per Dr Nadyne Coombes does not know what he replaced it with if anything and very confused with details of care but using multiple different when at baseline didn't need any / still feeling "throat and chest congestion" but can't cough anything up to clear it -grabs in neck at sternal notch to show where feels mucus is stuck rec Change nebulizer to where you take the duoneb (ipatropium/albuterol) up to every 6 hours only if needed (stop all other inhalers/ nebs) Call us back with any discrepancies on your med list today  Please remember to go to the lab and x-ray department downstairs for your tests - we will call you with the results when they are available. GERD diet     03/08/2016  f/u ov/Patrick [name] re: COPD GOLD III s/p  smoking 12/2015 no main or prns Chief Complaint  Patient presents with  . Follow-up    PFT done today. Breathing is doing well. His cough has improved. No new co's.   Not limited by breathing from desired activities    No obvious day to day or daytime variability or assoc excess/ purulent sputum or mucus plugs   cp or chest tightness, subjective wheeze or overt sinus or hb symptoms. No unusual exp hx or h/o childhood pna/ asthma or knowledge of premature birth.  Sleeping ok without nocturnal  or early am exacerbation  of respiratory  c/o's or need for noct saba. Also denies any obvious fluctuation of symptoms with weather or environmental changes or other aggravating or alleviating factors except as outlined above   Current Medications, Allergies, Complete Past Medical History, Past Surgical History, Family History, and Social History were reviewed in [employer] record.  ROS  The following are not active complaints unless bolded sore throat, dysphagia, dental problems, itching, sneezing,  nasal congestion or excess/ purulent secretions, ear ache,   fever, chills, sweats, unintended wt loss, classically pleuritic or exertional cp, hemoptysis,  orthopnea pnd or leg swelling, presyncope, palpitations, abdominal pain, anorexia, nausea, vomiting, diarrhea  or change in bowel or bladder habits, change in stools or urine, dysuria,hematuria,  rash, arthralgias, visual complaints, headache, numbness, weakness or ataxia or problems with walking or coordination,  change in mood/affect or memory.  Objective:   Physical Exam   amb wm with occ throat clearing     03/08/2016       141  01/30/16 135 lb 6.4 oz (61.417 kg)  01/04/16 129 lb 3.2 oz (58.605 kg)  08/24/15 134 lb 3.2 oz (60.873 kg)    Vital signs reviewed   HEENT: nl dentition, turbinates, and oropharynx. Nl external ear canals without cough reflex   NECK :  without JVD/Nodes/TM/ nl carotid  upstrokes bilaterally   LUNGS: no acc muscle use,  slt barrel contour chest with mild increased Texp/ disant bs bilaterally    CV:  RRR  no s3 or murmur or increase in P2, no edema   ABD:  soft and nontender with late insp hoover's sign  in the supine position. No bruits or organomegaly, bowel sounds nl  MS:  Nl gait/ ext warm without deformities, calf tenderness, cyanosis or clubbing No obvious joint restrictions   SKIN: warm and dry without lesions    NEURO:  alert, approp, nl sensorium with  no motor deficits   CXR PA and Lateral:   01/30/2016 :    I personally reviewed images and agree with radiology impression as follows:    COPD. Chronic interstitial prominence likely reflecting the patient's smoking history. There is no evidence of pneumonia nor other acute cardiopulmonary disease.    Labs ordered/ reviewed:      Chemistry      Component Value Date/Time   NA 137 01/30/2016 1230   K 4.1 01/30/2016 1230   CL 98 01/30/2016 1230   CO2 31 01/30/2016 1230   BUN 9 01/30/2016 1230   CREATININE 0.77 01/30/2016 1230      Component Value Date/Time   CALCIUM 10.0 01/30/2016 1230   ALKPHOS 62 12/30/2015 0122   AST 28 12/30/2015 0122   ALT 24 12/30/2015 0122   BILITOT 1.0 12/30/2015 0122        Lab Results  Component Value Date   WBC 7.2 01/30/2016   HGB 16.0 01/30/2016   HCT 47.5 01/30/2016   MCV 95.3 01/30/2016   PLT 222.0 01/30/2016                Assessment:

## 2016-03-08 NOTE — Assessment & Plan Note (Signed)
Ok on arb instead of acei, would avoid acei indefinitely in this setting

## 2016-03-09 DIAGNOSIS — J449 Chronic obstructive pulmonary disease, unspecified: Secondary | ICD-10-CM | POA: Diagnosis not present

## 2016-03-11 ENCOUNTER — Telehealth: Payer: Self-pay

## 2016-03-11 NOTE — Telephone Encounter (Signed)
Spoke to pt and informed of results

## 2016-03-11 NOTE — Telephone Encounter (Signed)
Pt states someone in x-ray called and wanted them to call back. Please call [phone]

## 2016-03-12 DIAGNOSIS — I10 Essential (primary) hypertension: Secondary | ICD-10-CM | POA: Diagnosis not present

## 2016-03-12 DIAGNOSIS — M4722 Other spondylosis with radiculopathy, cervical region: Secondary | ICD-10-CM | POA: Diagnosis not present

## 2016-03-26 DIAGNOSIS — H353111 Nonexudative age-related macular degeneration, right eye, early dry stage: Secondary | ICD-10-CM | POA: Diagnosis not present

## 2016-03-26 DIAGNOSIS — H35373 Puckering of macula, bilateral: Secondary | ICD-10-CM | POA: Diagnosis not present

## 2016-03-26 DIAGNOSIS — H353221 Exudative age-related macular degeneration, left eye, with active choroidal neovascularization: Secondary | ICD-10-CM | POA: Diagnosis not present

## 2016-03-26 DIAGNOSIS — H43811 Vitreous degeneration, right eye: Secondary | ICD-10-CM | POA: Diagnosis not present

## 2016-05-14 DIAGNOSIS — H35373 Puckering of macula, bilateral: Secondary | ICD-10-CM | POA: Diagnosis not present

## 2016-05-14 DIAGNOSIS — H43811 Vitreous degeneration, right eye: Secondary | ICD-10-CM | POA: Diagnosis not present

## 2016-05-14 DIAGNOSIS — H353111 Nonexudative age-related macular degeneration, right eye, early dry stage: Secondary | ICD-10-CM | POA: Diagnosis not present

## 2016-05-14 DIAGNOSIS — H353221 Exudative age-related macular degeneration, left eye, with active choroidal neovascularization: Secondary | ICD-10-CM | POA: Diagnosis not present

## 2016-05-29 DIAGNOSIS — H25813 Combined forms of age-related cataract, bilateral: Secondary | ICD-10-CM | POA: Diagnosis not present

## 2016-05-29 DIAGNOSIS — H17821 Peripheral opacity of cornea, right eye: Secondary | ICD-10-CM | POA: Diagnosis not present

## 2016-05-29 DIAGNOSIS — H1132 Conjunctival hemorrhage, left eye: Secondary | ICD-10-CM | POA: Diagnosis not present

## 2016-06-20 DIAGNOSIS — H2512 Age-related nuclear cataract, left eye: Secondary | ICD-10-CM | POA: Diagnosis not present

## 2016-06-25 ENCOUNTER — Other Ambulatory Visit: Payer: Self-pay | Admitting: Emergency Medicine

## 2016-07-08 DIAGNOSIS — H2511 Age-related nuclear cataract, right eye: Secondary | ICD-10-CM | POA: Diagnosis not present

## 2016-07-11 DIAGNOSIS — H2511 Age-related nuclear cataract, right eye: Secondary | ICD-10-CM | POA: Diagnosis not present

## 2016-07-23 DIAGNOSIS — H353223 Exudative age-related macular degeneration, left eye, with inactive scar: Secondary | ICD-10-CM | POA: Diagnosis not present

## 2016-07-23 DIAGNOSIS — H43811 Vitreous degeneration, right eye: Secondary | ICD-10-CM | POA: Diagnosis not present

## 2016-07-23 DIAGNOSIS — H35373 Puckering of macula, bilateral: Secondary | ICD-10-CM | POA: Diagnosis not present

## 2016-07-23 DIAGNOSIS — H353111 Nonexudative age-related macular degeneration, right eye, early dry stage: Secondary | ICD-10-CM | POA: Diagnosis not present

## 2016-08-08 DIAGNOSIS — Z95820 Peripheral vascular angioplasty status with implants and grafts: Secondary | ICD-10-CM | POA: Diagnosis not present

## 2016-08-08 DIAGNOSIS — I739 Peripheral vascular disease, unspecified: Secondary | ICD-10-CM | POA: Diagnosis not present

## 2016-09-05 ENCOUNTER — Other Ambulatory Visit: Payer: Self-pay | Admitting: Emergency Medicine

## 2016-09-09 ENCOUNTER — Other Ambulatory Visit: Payer: Self-pay | Admitting: Emergency Medicine

## 2016-09-20 DIAGNOSIS — I10 Essential (primary) hypertension: Secondary | ICD-10-CM | POA: Diagnosis not present

## 2016-09-20 DIAGNOSIS — I70213 Atherosclerosis of native arteries of extremities with intermittent claudication, bilateral legs: Secondary | ICD-10-CM | POA: Diagnosis not present

## 2016-09-20 DIAGNOSIS — R0602 Shortness of breath: Secondary | ICD-10-CM | POA: Diagnosis not present

## 2016-09-20 DIAGNOSIS — I2510 Atherosclerotic heart disease of native coronary artery without angina pectoris: Secondary | ICD-10-CM | POA: Diagnosis not present

## 2016-10-10 ENCOUNTER — Encounter: Payer: Self-pay | Admitting: Vascular Surgery

## 2016-10-16 ENCOUNTER — Ambulatory Visit (INDEPENDENT_AMBULATORY_CARE_PROVIDER_SITE_OTHER): Payer: Medicare Other | Admitting: Vascular Surgery

## 2016-10-16 ENCOUNTER — Encounter: Payer: Self-pay | Admitting: Vascular Surgery

## 2016-10-16 VITALS — BP 107/59 | HR 71 | Temp 97.9°F | Resp 16 | Ht 69.0 in | Wt 141.0 lb

## 2016-10-16 DIAGNOSIS — I729 Aneurysm of unspecified site: Secondary | ICD-10-CM

## 2016-10-16 NOTE — Progress Notes (Signed)
[facility] Specialist of [place]  Patient name: Patrick [name] MRN: [id number] DOB: 1946/05/04 Sex: male  REASON FOR CONSULT: Possible right femoral false aneurysm  HPI: Patrick [name] is a 70 y.o. male, who is here today for follow-up of peripheral vascular occlusive disease. He is status post right iliac thrombectomy and external iliac to profunda bypass with 8 mm Hemashield graft in 2007. Chronic occlusion of his superficial femoral artery from its origin which was present time of his initial surgery. He does have stable nonlimiting claudication in his right leg. He has undergone arterial evaluation and most recently suggested the 2.5 cm dilatation at the area of his his right leg bypass to common femoral anastomosis. He is seen today for further discussion of this. Not have any new ischemic symptoms. Did have hospitalization earlier this year for COPD exacerbation. Have a history of coronary artery disease as well  Past Medical History:  Diagnosis Date  . CAD (coronary artery disease)   . COPD (chronic obstructive pulmonary disease) ([place])   . Hyperlipidemia   . Hypertension     Family History  Problem Relation Age of Onset  . Cancer Mother   . Cancer Father   . Diabetes Sister   . Diabetes Brother     SOCIAL HISTORY: Social History   Social History  . Marital status: Single    Spouse name: N/A  . Number of children: N/A  . Years of education: N/A   Occupational History  . Not on file.   Social History Main Topics  . Smoking status: Former Smoker    Packs/day: 1.25    Years: 39.00    Types: Cigarettes    Quit date: 12/31/2015  . Smokeless tobacco: Never Used  . Alcohol use No  . Drug use: No  . Sexual activity: Not on file   Other Topics Concern  . Not on file   Social History Narrative  . No narrative on file    Allergies  Allergen Reactions  . Aspirin Swelling  . Cephalexin Itching  . Ibuprofen Swelling     Current Outpatient Prescriptions  Medication Sig Dispense Refill  . atorvastatin (LIPITOR) 40 MG tablet Take 40 mg by mouth daily.    . carvedilol (COREG) 3.125 MG tablet Take 3.125 mg by mouth 2 (two) times daily with a meal.    . cilostazol (PLETAL) 100 MG tablet Take 100 mg by mouth 2 (two) times daily.    . clopidogrel (PLAVIX) 75 MG tablet Take 75 mg by mouth daily.    . hydrochlorothiazide (HYDRODIURIL) 12.5 MG tablet   0  . Multiple Vitamin (MULTIVITAMIN WITH MINERALS) TABS tablet Take 1 tablet by mouth daily. Centrum Silver    . valsartan (DIOVAN) 320 MG tablet   2  . vitamin C (ASCORBIC ACID) 500 MG tablet Take 500 mg by mouth daily as needed (immune system boost).    . Vitamin D, Ergocalciferol, (DRISDOL) 50000 UNITS CAPS capsule Take 50,000 Units by mouth every 7 (seven) days. Wednesdays    . dextromethorphan-guaiFENesin (MUCINEX DM) 30-600 MG 12hr tablet Take 1 tablet by mouth 2 (two) times daily. (Patient not taking: Reported on 10/16/2016) 40 tablet 0  . gabapentin (NEURONTIN) 100 MG capsule TAKE 1-2 CAPSULES BY MOUTH UP TO 3 TIMES A DAY AS NEEDED FOR SHOULDER PAIN (Patient not taking: Reported on 10/16/2016) 90 capsule 0  . tobramycin (TOBREX) 0.3 % ophthalmic solution   1  . valsartan (DIOVAN) 80 MG tablet Take  80 mg by mouth daily.     No current facility-administered medications for this visit.     REVIEW OF SYSTEMS:  [X]  denotes positive finding, [ ]  denotes negative finding Cardiac  Comments:  Chest pain or chest pressure:    Shortness of breath upon exertion: x   Short of breath when lying flat:    Irregular heart rhythm:        Vascular    Pain in calf, thigh, or hip brought on by ambulation:    Pain in feet at night that wakes you up from your sleep:     Blood clot in your veins:    Leg swelling:         Pulmonary    Oxygen at home:    Productive cough:  x   Wheezing:         Neurologic    Sudden weakness in arms or legs:     Sudden numbness in  arms or legs:     Sudden onset of difficulty speaking or slurred speech:    Temporary loss of vision in one eye:     Problems with dizziness:         Gastrointestinal    Blood in stool:     Vomited blood:         Genitourinary    Burning when urinating:     Blood in urine:        Psychiatric    Major depression:         Hematologic    Bleeding problems:    Problems with blood clotting too easily:        Skin    Rashes or ulcers:        Constitutional    Fever or chills:      PHYSICAL EXAM: Vitals:   10/16/16 1519  BP: (!) 107/59  Pulse: 71  Resp: 16  Temp: 97.9 F (36.6 C)  SpO2: 100%  Weight: 141 lb (64 kg)  Height: 5\' 9"  (1.753 m)    GENERAL: The patient is a well-nourished male, in no acute distress. The vital signs are documented above. CARDIOVASCULAR: 2+ radial and 2+ femoral pulses bilaterally. 2+ left popliteal and 2+ left dorsalis pedis pulse. Absent right popliteal and distal pulses. Carotid arteries without bruits bilaterally PULMONARY: There is good air exchange  ABDOMEN: Soft and non-tender  MUSCULOSKELETAL: There are no major deformities or cyanosis. NEUROLOGIC: No focal weakness or paresthesias are detected. SKIN: There are no ulcers or rashes noted. PSYCHIATRIC: The patient has a normal affect.  DATA:  Arterial study from [facility] was reviewed from 08/08/2016. This suggested possible right femoral false aneurysm.: Index is 0.50 on the right and 0.93 on the left  MEDICAL ISSUES: I discussed this at length with the patient. I physical exam he does not appear to have a discrete false aneurysm. This appears to be diffuse widening at the junction of his 71mm graft to his common femoral artery extending down onto the femoral artery. I have recommended continued observation only related to this. I would recommend reevaluation in one year with a CT scan of this area for baseline. I explained to be highly unlikely for him to have any thrombotic  or rupture events related to this. Did review these as potential symptoms to look for. He understands and will see [place] in one year with CT scan   Rosetta Posner, MD [facility] Tel [phone] Pager [phone]    

## 2016-10-23 DIAGNOSIS — H353223 Exudative age-related macular degeneration, left eye, with inactive scar: Secondary | ICD-10-CM | POA: Diagnosis not present

## 2016-10-23 DIAGNOSIS — H35373 Puckering of macula, bilateral: Secondary | ICD-10-CM | POA: Diagnosis not present

## 2016-10-23 DIAGNOSIS — H43811 Vitreous degeneration, right eye: Secondary | ICD-10-CM | POA: Diagnosis not present

## 2016-10-23 DIAGNOSIS — H353111 Nonexudative age-related macular degeneration, right eye, early dry stage: Secondary | ICD-10-CM | POA: Diagnosis not present

## 2016-11-12 NOTE — Addendum Note (Signed)
Addended by: Mena Goes on: 11/12/2016 03:57 PM   Modules accepted: Orders

## 2016-12-23 ENCOUNTER — Encounter (HOSPITAL_COMMUNITY): Payer: Self-pay

## 2016-12-23 ENCOUNTER — Emergency Department (HOSPITAL_COMMUNITY)
Admission: EM | Admit: 2016-12-23 | Discharge: 2016-12-23 | Disposition: A | Discharge status: Home / Self Care | Payer: Medicare Other | Attending: Emergency Medicine | Admitting: Emergency Medicine

## 2016-12-23 DIAGNOSIS — I110 Hypertensive heart disease with heart failure: Secondary | ICD-10-CM | POA: Insufficient documentation

## 2016-12-23 DIAGNOSIS — J449 Chronic obstructive pulmonary disease, unspecified: Secondary | ICD-10-CM | POA: Insufficient documentation

## 2016-12-23 DIAGNOSIS — Z8551 Personal history of malignant neoplasm of bladder: Secondary | ICD-10-CM | POA: Insufficient documentation

## 2016-12-23 DIAGNOSIS — I10 Essential (primary) hypertension: Secondary | ICD-10-CM

## 2016-12-23 DIAGNOSIS — Z79899 Other long term (current) drug therapy: Secondary | ICD-10-CM | POA: Diagnosis not present

## 2016-12-23 DIAGNOSIS — Z87891 Personal history of nicotine dependence: Secondary | ICD-10-CM | POA: Diagnosis not present

## 2016-12-23 DIAGNOSIS — I5021 Acute systolic (congestive) heart failure: Secondary | ICD-10-CM | POA: Diagnosis not present

## 2016-12-23 LAB — I-STAT CHEM 8, ED
BUN: 11 mg/dL (ref 6–20)
CALCIUM ION: 1.2 mmol/L (ref 1.15–1.40)
CHLORIDE: 99 mmol/L — AB (ref 101–111)
Creatinine, Ser: 0.8 mg/dL (ref 0.61–1.24)
GLUCOSE: 120 mg/dL — AB (ref 65–99)
HCT: 39 % (ref 39.0–52.0)
HEMOGLOBIN: 13.3 g/dL (ref 13.0–17.0)
Potassium: 3.6 mmol/L (ref 3.5–5.1)
SODIUM: 139 mmol/L (ref 135–145)
TCO2: 30 mmol/L (ref 0–100)

## 2016-12-23 NOTE — Discharge Instructions (Signed)
Discuss medicine management with cardiology.  If you were given medicines take as directed.  If you are on coumadin or contraceptives realize their levels and effectiveness is altered by many different medicines.  If you have any reaction (rash, tongues swelling, other) to the medicines stop taking and see a physician.    If your blood pressure was elevated in the ER make sure you follow up for management with a primary doctor or return for chest pain, shortness of breath or stroke symptoms.  Please follow up as directed and return to the ER or see a physician for new or worsening symptoms.  Thank you. Vitals:   12/23/16 2116 12/23/16 2130 12/23/16 2145 12/23/16 2200  BP: 146/94 124/81 113/63 99/60  Pulse: 73 77 87 81  Resp: 16     Temp:      TempSrc:      SpO2: 100% 97% 97% 95%  Weight:      Height:

## 2016-12-23 NOTE — ED Provider Notes (Signed)
[facility] DEPT Provider Note   CSN: [id number] Arrival date & time: 12/23/16  1943     History   Chief Complaint Chief Complaint  Patient presents with  . Hypertension    HPI Patrick [name] is a 70 y.o. male.  Patient presents with feeling anxious general malaise and blood pressure reading 200 at home. Patient has no chest pain or shortness of breath no neurologic symptoms no concerning headaches. Patient has history of high blood pressures followed outpatient and is compliant with medications. Patient also has cardiac history and vascular disease. No leg swellingof breath. No change in medication. Patient has been using albuterol inhaler the past few days. No other over-the-counter meds. Patient uses heavy caffeine use.      Past Medical History:  Diagnosis Date  . CAD (coronary artery disease)   . COPD (chronic obstructive pulmonary disease) ([place])   . Hyperlipidemia   . Hypertension     Patient Active Problem List   Diagnosis Date Noted  . Upper airway cough syndrome 01/30/2016  . COPD GOLD III copd  01/30/2016  . Shortness of breath 12/30/2015  . Demand ischemia ([place]) 12/30/2015  . Sepsis ([place]) 12/30/2015  . Influenza-like illness 12/30/2015  . Acute systolic CHF (congestive heart failure) ([place])   . Atherosclerotic peripheral vascular disease ([place]) 03/17/2014  . Occlusion and stenosis of carotid artery without mention of cerebral infarction 09/15/2013  . PAD (peripheral artery disease) ([place]) 09/15/2013  . Tobacco use disorder 09/10/2013  . Coronary atherosclerosis of native coronary artery 09/10/2013  . HYPERLIPIDEMIA 04/22/2008  . Essential hypertension 04/22/2008  . CARCINOMA, BLADDER, HX OF 04/22/2008  . DIVERTICULOSIS, COLON 07/20/2007    Past Surgical History:  Procedure Laterality Date  . CARDIAC CATHETERIZATION N/A 12/30/2015   Procedure: Left Heart Cath and Coronary Angiography;  Surgeon: Adrian Prows, MD;  Location: [facility];  Service:  Cardiovascular;  Laterality: N/A;       Home Medications    Prior to Admission medications   Medication Sig Start Date End Date Taking? Authorizing Provider  atorvastatin (LIPITOR) 40 MG tablet Take 40 mg by mouth daily.    Historical Provider, MD  carvedilol (COREG) 3.125 MG tablet Take 3.125 mg by mouth 2 (two) times daily with a meal.    Historical Provider, MD  cilostazol (PLETAL) 100 MG tablet Take 100 mg by mouth 2 (two) times daily.    Historical Provider, MD  clopidogrel (PLAVIX) 75 MG tablet Take 75 mg by mouth daily.    Historical Provider, MD  dextromethorphan-guaiFENesin (MUCINEX DM) 30-600 MG 12hr tablet Take 1 tablet by mouth 2 (two) times daily. Patient not taking: Reported on 10/16/2016 01/04/16   Ripudeep K Rai, MD  gabapentin (NEURONTIN) 100 MG capsule TAKE 1-2 CAPSULES BY MOUTH UP TO 3 TIMES A DAY AS NEEDED FOR SHOULDER PAIN Patient not taking: Reported on 10/16/2016 09/10/16   Darlyne Russian, MD  hydrochlorothiazide (HYDRODIURIL) 12.5 MG tablet  08/13/16   Historical Provider, MD  Multiple Vitamin (MULTIVITAMIN WITH MINERALS) TABS tablet Take 1 tablet by mouth daily. Centrum Silver    Historical Provider, MD  tobramycin (TOBREX) 0.3 % ophthalmic solution  07/13/16   Historical Provider, MD  valsartan (DIOVAN) 320 MG tablet  10/02/16   Historical Provider, MD  valsartan (DIOVAN) 80 MG tablet Take 80 mg by mouth daily.    Historical Provider, MD  vitamin C (ASCORBIC ACID) 500 MG tablet Take 500 mg by mouth daily as needed (immune system boost).  Historical Provider, MD  Vitamin D, Ergocalciferol, (DRISDOL) 50000 UNITS CAPS capsule Take 50,000 Units by mouth every 7 (seven) days. Wednesdays    Historical Provider, MD    Family History Family History  Problem Relation Age of Onset  . Cancer Mother   . Cancer Father   . Diabetes Sister   . Diabetes Brother     Social History Social History  Substance Use Topics  . Smoking status: Former Smoker    Packs/day: 1.25     Years: 39.00    Types: Cigarettes    Quit date: 12/31/2015  . Smokeless tobacco: Never Used  . Alcohol use No     Allergies   Aspirin; Cephalexin; and Ibuprofen   Review of Systems Review of Systems  Constitutional: Negative for chills and fever.  HENT: Negative for ear pain and sore throat.   Eyes: Negative for pain and visual disturbance.  Respiratory: Negative for cough and shortness of breath.   Cardiovascular: Negative for chest pain and palpitations.  Gastrointestinal: Negative for abdominal pain and vomiting.  Genitourinary: Negative for dysuria and hematuria.  Musculoskeletal: Negative for arthralgias and back pain.  Skin: Negative for color change and rash.  Neurological: Positive for dizziness (resolved). Negative for seizures and syncope.  Psychiatric/Behavioral: The patient is nervous/anxious.   All other systems reviewed and are negative.    Physical Exam Updated Vital Signs BP 99/60   Pulse 81   Temp 98 F (36.7 C) (Oral)   Resp 16   Ht 5\' 8"  (1.727 m)   Wt 140 lb 2 oz (63.6 kg)   SpO2 95%   BMI 21.31 kg/m   Physical Exam  Constitutional: He appears well-developed and well-nourished.  HENT:  Head: Normocephalic and atraumatic.  Eyes: Conjunctivae are normal.  Neck: Neck supple.  Cardiovascular: Normal rate and regular rhythm.   No murmur heard. Pulmonary/Chest: Effort normal and breath sounds normal. No respiratory distress.  Abdominal: Soft. There is no tenderness.  Musculoskeletal: He exhibits no edema.  Neurological: He is alert. No cranial nerve deficit.  Skin: Skin is warm and dry.  Psychiatric: He has a normal mood and affect.  Nursing note and vitals reviewed.    ED Treatments / Results  Labs (all labs ordered are listed, but only abnormal results are displayed) Labs Reviewed  I-STAT CHEM 8, ED - Abnormal; Notable for the following:       Result Value   Chloride 99 (*)    Glucose, Bld 120 (*)    All other components within normal  limits    EKG  EKG Interpretation None       Radiology No results found.  Procedures Procedures (including critical care time)  Medications Ordered in ED Medications - No data to display   Initial Impression / Assessment and Plan / ED Course  I have reviewed the triage vital signs and the nursing notes.  Pertinent labs & imaging results that were available during my care of the patient were reviewed by me and considered in my medical decision making (see chart for details).  Clinical Course    Patient presents with elevated blood pressure currently asymptomatic. Patient's blood pressure improved to normal 90/70 without treatment. Discussion possibly related to anxiety/albuterol inhaler helper patient needs close follow-up with cardiology. Screening blood work unremarkable. No indication for admission or medicine changes at this time. No clinical signs of end organ damage.  Results and differential diagnosis were discussed with the patient/parent/guardian. Xrays were independently reviewed by myself.  Close follow up outpatient was discussed, comfortable with the plan.   Medications - No data to display  Vitals:   12/23/16 2116 12/23/16 2130 12/23/16 2145 12/23/16 2200  BP: 146/94 124/81 113/63 99/60  Pulse: 73 77 87 81  Resp: 16     Temp:      TempSrc:      SpO2: 100% 97% 97% 95%  Weight:      Height:        Final diagnoses:  Essential hypertension     Final Clinical Impressions(s) / ED Diagnoses   Final diagnoses:  Essential hypertension    New Prescriptions New Prescriptions   No medications on file     Elnora Morrison, MD 12/23/16 2233

## 2016-12-23 NOTE — ED Triage Notes (Signed)
Pt complaining of generalized malaise. Pt states home bp reads "high." Pt complaining of some dizziness, denies any headache. No obvious neuro deficits at triage.

## 2016-12-27 DIAGNOSIS — R7303 Prediabetes: Secondary | ICD-10-CM | POA: Diagnosis not present

## 2016-12-27 DIAGNOSIS — I2510 Atherosclerotic heart disease of native coronary artery without angina pectoris: Secondary | ICD-10-CM | POA: Diagnosis not present

## 2016-12-27 DIAGNOSIS — E782 Mixed hyperlipidemia: Secondary | ICD-10-CM | POA: Diagnosis not present

## 2017-01-09 DIAGNOSIS — R0602 Shortness of breath: Secondary | ICD-10-CM | POA: Diagnosis not present

## 2017-01-09 DIAGNOSIS — E782 Mixed hyperlipidemia: Secondary | ICD-10-CM | POA: Diagnosis not present

## 2017-01-09 DIAGNOSIS — I2510 Atherosclerotic heart disease of native coronary artery without angina pectoris: Secondary | ICD-10-CM | POA: Diagnosis not present

## 2017-01-09 DIAGNOSIS — I70213 Atherosclerosis of native arteries of extremities with intermittent claudication, bilateral legs: Secondary | ICD-10-CM | POA: Diagnosis not present

## 2017-01-15 ENCOUNTER — Ambulatory Visit: Payer: Medicare Other | Admitting: Medical

## 2017-01-16 ENCOUNTER — Ambulatory Visit: Payer: Medicare Other | Admitting: Medical

## 2017-01-21 ENCOUNTER — Telehealth: Payer: Self-pay | Admitting: *Deleted

## 2017-01-21 NOTE — Telephone Encounter (Signed)
AWV scheduled for 01/23/17 @2 

## 2017-01-22 NOTE — Progress Notes (Signed)
Subjective:   Patrick [name] is a 71 y.o. male who presents for an Initial Medicare Annual Wellness Visit.  Pt in first time. He lived in area.  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  Pt does exercise when weather permits. 1/2 mile daily. Pt stopped caffeine 2 weeks ago. Quit smoking Dec 31, 2015. Rare alcohol use.  Copd- dx in chart. Pt in past saw Dr. Melvyn Novas. Pt has some albuterol inhaler available.  Gerd- some reflux. Feels like need to clear his throat. No med used. He does raise head of his bed. No hx of egd. Had gerd one year ago and was worse.  htn- pt bp controlled. Pton diovan and coreg.  Pt a1-c in December 6.7.   Pt is concerned about having to move his bowels so often that he doesn't like to eat out any more.  Pt on lipitor for cholesterol.Pt last lipid panel looked.  Pt has some history of neuropathy in his arm. Related to neck issue per pt. Pt states UC gave him gabapentin. He just takes one at bed time in the past. He tapered off in th past.  Pt on plavix. He had stent placed in the past and they gave him plavix. He is allergic to aspirin.  No psa on review. But no urinary obstuctive symptoms.  Pt flu vaccine today.  Pt thinks last colonoscopy was in 2007. Pt thinks normal.  Review of Systems (this portion done by RN) No ROS.  Medicare Wellness Visit. Cardiac Risk Factors include: advanced age (>97men, >15 women);dyslipidemia;hypertension;male gender;sedentary lifestyle Sleep patterns: Difficulty staying asleep. Sleeps about 6 hrs per night. Feels rested. Home Safety/Smoke Alarms:  Feels safe in home. Smoke alarms in place.  Living environment; residence and Firearm Safety: Lives at home with 2 roommates.No guns. Seat Belt Safety/Bike Helmet: Wears seat belt.   Counseling:   Eye Exam- Dr.Saunder and Dr.Groat annually or more. Dental- No dentist due to financial reasons.Dental resource list provide.   Male:    CCS- pt states about 5 yrs ago  and that screening was normal.   PSA- No results found for: PSA Pt will request record.    General- No acute distress. Pleasant patient. Neck- Full range of motion, no jvd Lungs- Clear, even and unlabored. Heart- regular rate and rhythm. Neurologic- CNII- XII grossly intact. Abdomen-soft, nt,nd, +bs    Objective:    Today's Vitals   01/23/17 1421  BP: (!) 110/58  Pulse: 77  SpO2: 98%  Weight: 137 lb 12.8 oz (62.5 kg)  Height: 5\' 8"  (1.727 m)   Body mass index is 20.95 kg/m.  Current Medications (verified) Outpatient Encounter Prescriptions as of 01/23/2017  Medication Sig  . atorvastatin (LIPITOR) 40 MG tablet Take 40 mg by mouth daily.  . carvedilol (COREG) 6.25 MG tablet Take 6.25 mg by mouth 2 (two) times daily with a meal.  . cilostazol (PLETAL) 100 MG tablet Take 100 mg by mouth 2 (two) times daily.  . clopidogrel (PLAVIX) 75 MG tablet Take 75 mg by mouth daily.  Marland Kitchen dextromethorphan-guaiFENesin (MUCINEX DM) 30-600 MG 12hr tablet Take 1 tablet by mouth 2 (two) times daily.  Marland Kitchen gabapentin (NEURONTIN) 100 MG capsule TAKE 1-2 CAPSULES BY MOUTH UP TO 3 TIMES A DAY AS NEEDED FOR SHOULDER PAIN  . hydrochlorothiazide (HYDRODIURIL) 12.5 MG tablet   . Multiple Vitamin (MULTIVITAMIN WITH MINERALS) TABS tablet Take 1 tablet by mouth daily. Centrum Silver  . valsartan (DIOVAN) 320 MG tablet   .  vitamin C (ASCORBIC ACID) 500 MG tablet Take 500 mg by mouth daily as needed (immune system boost).  . Vitamin D, Ergocalciferol, (DRISDOL) 50000 UNITS CAPS capsule Take 50,000 Units by mouth every 7 (seven) days. Wednesdays  . tobramycin (TOBREX) 0.3 % ophthalmic solution   . [DISCONTINUED] carvedilol (COREG) 3.125 MG tablet Take 6.25 mg by mouth 2 (two) times daily with a meal.   . [DISCONTINUED] valsartan (DIOVAN) 80 MG tablet Take 320 mg by mouth daily.    No facility-administered encounter medications on file as of 01/23/2017.     Allergies (verified) Aspirin; Cephalexin; and Ibuprofen     History: Past Medical History:  Diagnosis Date  . CAD (coronary artery disease)   . COPD (chronic obstructive pulmonary disease) ([place])   . Hyperlipidemia   . Hypertension    Past Surgical History:  Procedure Laterality Date  . CARDIAC CATHETERIZATION N/A 12/30/2015   Procedure: Left Heart Cath and Coronary Angiography;  Surgeon: Adrian Prows, MD;  Location: [facility];  Service: Cardiovascular;  Laterality: N/A;  . EYE SURGERY     Bilateral cataract sx 2017   Family History  Problem Relation Age of Onset  . Cancer Mother   . Cancer Father   . Diabetes Sister   . Diabetes Brother    Social History   Occupational History  . Not on file.   Social History Main Topics  . Smoking status: Former Smoker    Packs/day: 1.25    Years: 39.00    Types: Cigarettes    Quit date: 12/31/2015  . Smokeless tobacco: Never Used  . Alcohol use No  . Drug use: No  . Sexual activity: No   Tobacco Counseling Counseling given: No   Activities of Daily Living In your present state of health, do you have any difficulty performing the following activities: 01/23/2017 01/23/2017  Hearing? N N  Vision? Y Y  Difficulty concentrating or making decisions? N -  Walking or climbing stairs? N -  Dressing or bathing? N -  Doing errands, shopping? N -  Preparing Food and eating ? N -  Using the Toilet? N -  In the past six months, have you accidently leaked urine? N -  Do you have problems with loss of bowel control? N -  Managing your Medications? N -  Managing your Finances? N -  Housekeeping or managing your Housekeeping? N -  Some recent data might be hidden    Immunizations and Health Maintenance There is no immunization history for the selected administration types on file for this patient. Health Maintenance Due  Topic Date Due  . Hepatitis C Screening  May 30, 1946  . TETANUS/TDAP  03/04/1965  . COLONOSCOPY  03/04/1996  . ZOSTAVAX  03/04/2006  . PNA vac Low Risk Adult (1 of 2  - PCV13) 03/05/2011  . INFLUENZA VACCINE  07/30/2016    Patient Care Team: Mackie Pai, PA-C as PCP - General (Internal Medicine)  Indicate any recent Medical Services you may have received from other than [facility] providers in the past year (date may be approximate).    Assessment:   This is a routine wellness examination for Patrick [name]. Physical assessment deferred to PCP.   Hearing/Vision screen  Hearing Screening   125Hz  250Hz  500Hz  1000Hz  2000Hz  3000Hz  4000Hz  6000Hz  8000Hz   Right ear:   Pass Pass Pass  Pass    Left ear:   Pass Pass Pass  Pass    Comments: Able to hear conversational tones w/o difficulty. No  issues reported.     Visual Acuity Screening   Right eye Left eye Both eyes  Without correction: 20/25 20/200 20/25  With correction:       Dietary issues and exercise activities discussed: Current Exercise Habits: The patient does not participate in regular exercise at present, Exercise limited by: None identified  Diet 24 HOUR RECALL: Breakfast: toast and powdered mini doughnuts. Coffee (decaf) Lunch: Chilli beans and milk Dinner:  Chicken and broccoli and decaf coffee. Only drinks water occasionally.     Goals    . Would like to have vision corrected.      Depression Screen PHQ 2/9 Scores 01/23/2017 02/24/2016 08/24/2015  PHQ - 2 Score 0 0 0    Fall Risk Fall Risk  01/23/2017 02/24/2016  Falls in the past year? No No    Cognitive Function: MMSE - Mini Mental State Exam 01/23/2017  Orientation to time 5  Orientation to Place 5  Registration 3  Attention/ Calculation 5  Recall 1  Language- name 2 objects 2  Language- repeat 1  Language- follow 3 step command 3  Language- read & follow direction 1  Write a sentence 1  Copy design 1  Total score 28        Screening Tests Health Maintenance  Topic Date Due  . Hepatitis C Screening  02/27/1946  . TETANUS/TDAP  03/04/1965  . COLONOSCOPY  03/04/1996  . ZOSTAVAX  03/04/2006  . PNA vac Low Risk Adult (1  of 2 - PCV13) 03/05/2011  . INFLUENZA VACCINE  07/30/2016        Plan:     Follow up with Patrick [name] after this visit as scheduled.  Continue to eat heart healthy diet (full of fruits, vegetables, whole grains, lean protein, water--limit salt, fat, and sugar intake) and increase physical activity as tolerated.  Continue doing brain stimulating activities (puzzles, reading, adult coloring books, staying active) to keep memory sharp.    During the course of the visit Patrick [name] was educated and counseled about the following appropriate screening and preventive services:   Vaccines to include Pneumoccal, Influenza, Hepatitis B, Td, Zostavax, HCV  Colorectal cancer screening  Cardiovascular disease screening  Diabetes screening  Glaucoma screening  Nutrition counseling  Prostate cancer screening  Patient Instructions (the written plan) were given to the patient.   Patrick [name], [place]   01/23/2017     I have reviewed your wellness exam with RN today.  Also your various medical conditions high cholesterol, gerd, CAD, htn, copd all seem releatively well controlled. Continue current meds. I will refill your neurontin if you arm nerve pain returns. Also will rx ranitidine to use if needed. Eat healthy diet as well.  I will place referral to GI for colonsocopy.  Follow up in 3 months or as needed. Will recheck your [id number] and metabolic panel on that visit.  [place], Patrick Miller, PA-C

## 2017-01-22 NOTE — Progress Notes (Signed)
Pre visit review using our clinic review tool, if applicable. No additional management support is needed unless otherwise documented below in the visit note. 

## 2017-01-23 ENCOUNTER — Encounter: Payer: Self-pay | Admitting: Medical

## 2017-01-23 ENCOUNTER — Ambulatory Visit (INDEPENDENT_AMBULATORY_CARE_PROVIDER_SITE_OTHER): Payer: Medicare Other | Admitting: Medical

## 2017-01-23 VITALS — BP 110/58 | HR 77 | Ht 68.0 in | Wt 137.8 lb

## 2017-01-23 DIAGNOSIS — Z0000 Encounter for general adult medical examination without abnormal findings: Secondary | ICD-10-CM | POA: Diagnosis not present

## 2017-01-23 DIAGNOSIS — I10 Essential (primary) hypertension: Secondary | ICD-10-CM | POA: Diagnosis not present

## 2017-01-23 DIAGNOSIS — K219 Gastro-esophageal reflux disease without esophagitis: Secondary | ICD-10-CM

## 2017-01-23 DIAGNOSIS — Z1211 Encounter for screening for malignant neoplasm of colon: Secondary | ICD-10-CM | POA: Diagnosis not present

## 2017-01-23 DIAGNOSIS — Z23 Encounter for immunization: Secondary | ICD-10-CM

## 2017-01-23 DIAGNOSIS — J441 Chronic obstructive pulmonary disease with (acute) exacerbation: Secondary | ICD-10-CM

## 2017-01-23 DIAGNOSIS — I2510 Atherosclerotic heart disease of native coronary artery without angina pectoris: Secondary | ICD-10-CM

## 2017-01-23 DIAGNOSIS — I2583 Coronary atherosclerosis due to lipid rich plaque: Secondary | ICD-10-CM

## 2017-01-23 DIAGNOSIS — G629 Polyneuropathy, unspecified: Secondary | ICD-10-CM

## 2017-01-23 DIAGNOSIS — E785 Hyperlipidemia, unspecified: Secondary | ICD-10-CM

## 2017-01-23 MED ORDER — GABAPENTIN 100 MG PO CAPS: 100.0000 mg | ORAL_CAPSULE | Freq: Every day | ORAL | 0 refills | Status: DC

## 2017-01-23 MED ORDER — RANITIDINE HCL 150 MG PO CAPS: 150.0000 mg | ORAL_CAPSULE | Freq: Two times a day (BID) | ORAL | 0 refills | Status: DC

## 2017-01-23 NOTE — Patient Instructions (Addendum)
Continue to eat heart healthy diet (full of fruits, vegetables, whole grains, lean protein, water--limit salt, fat, and sugar intake) and increase physical activity as tolerated.  Continue doing brain stimulating activities (puzzles, reading, adult coloring books, staying active) to keep memory sharp.    I have reviewed your wellness exam with RN today.  Also your various medical conditions high cholesterol, gerd, CAD, htn, copd all seem releatively well controlled. Continue current meds. I will refill your neurontin if you arm nerve pain returns. Also will rx ranitidine to use if needed. Eat healthy diet as well.  I will place referral to GI for colonsocopy.  Follow up in 3 months or as needed. Will recheck your [id number] and metabolic panel on that visit.   On the way out please give get staff member to make copy of your most recent labs for our office.

## 2017-01-28 ENCOUNTER — Other Ambulatory Visit: Payer: Self-pay | Admitting: *Deleted

## 2017-01-28 NOTE — Progress Notes (Signed)
Per pharmacy fax for insurance coverage, change Zantac to tablet form/SLS 01/30

## 2017-02-28 ENCOUNTER — Other Ambulatory Visit: Payer: Self-pay | Admitting: Medical

## 2017-03-06 NOTE — Telephone Encounter (Signed)
Refill sent per [facility] refill protocol/SLS  

## 2017-03-19 DIAGNOSIS — H353222 Exudative age-related macular degeneration, left eye, with inactive choroidal neovascularization: Secondary | ICD-10-CM | POA: Diagnosis not present

## 2017-03-19 DIAGNOSIS — H17821 Peripheral opacity of cornea, right eye: Secondary | ICD-10-CM | POA: Diagnosis not present

## 2017-03-19 DIAGNOSIS — Z961 Presence of intraocular lens: Secondary | ICD-10-CM | POA: Diagnosis not present

## 2017-04-18 ENCOUNTER — Other Ambulatory Visit: Payer: Self-pay | Admitting: Medical

## 2017-04-23 DIAGNOSIS — H35373 Puckering of macula, bilateral: Secondary | ICD-10-CM | POA: Diagnosis not present

## 2017-04-23 DIAGNOSIS — H353111 Nonexudative age-related macular degeneration, right eye, early dry stage: Secondary | ICD-10-CM | POA: Diagnosis not present

## 2017-04-23 DIAGNOSIS — H43813 Vitreous degeneration, bilateral: Secondary | ICD-10-CM | POA: Diagnosis not present

## 2017-04-23 DIAGNOSIS — H353223 Exudative age-related macular degeneration, left eye, with inactive scar: Secondary | ICD-10-CM | POA: Diagnosis not present

## 2017-04-23 NOTE — Telephone Encounter (Signed)
Pt due for follow up and fasting labs please call schedule appointment.

## 2017-05-02 NOTE — Telephone Encounter (Signed)
Left message on voicemail for patient to call our office to schedule a follow up with fasting labs.

## 2017-05-09 ENCOUNTER — Encounter: Payer: Self-pay | Admitting: Gastroenterology

## 2017-05-27 DIAGNOSIS — H353111 Nonexudative age-related macular degeneration, right eye, early dry stage: Secondary | ICD-10-CM | POA: Diagnosis not present

## 2017-05-27 DIAGNOSIS — H353223 Exudative age-related macular degeneration, left eye, with inactive scar: Secondary | ICD-10-CM | POA: Diagnosis not present

## 2017-05-27 DIAGNOSIS — H35373 Puckering of macula, bilateral: Secondary | ICD-10-CM | POA: Diagnosis not present

## 2017-05-27 DIAGNOSIS — H43813 Vitreous degeneration, bilateral: Secondary | ICD-10-CM | POA: Diagnosis not present

## 2017-07-09 DIAGNOSIS — I70213 Atherosclerosis of native arteries of extremities with intermittent claudication, bilateral legs: Secondary | ICD-10-CM | POA: Diagnosis not present

## 2017-07-09 DIAGNOSIS — H10022 Other mucopurulent conjunctivitis, left eye: Secondary | ICD-10-CM | POA: Diagnosis not present

## 2017-07-14 DIAGNOSIS — H10022 Other mucopurulent conjunctivitis, left eye: Secondary | ICD-10-CM | POA: Diagnosis not present

## 2017-07-29 DIAGNOSIS — H43813 Vitreous degeneration, bilateral: Secondary | ICD-10-CM | POA: Diagnosis not present

## 2017-07-29 DIAGNOSIS — H353223 Exudative age-related macular degeneration, left eye, with inactive scar: Secondary | ICD-10-CM | POA: Diagnosis not present

## 2017-07-29 DIAGNOSIS — H35373 Puckering of macula, bilateral: Secondary | ICD-10-CM | POA: Diagnosis not present

## 2017-07-29 DIAGNOSIS — H353211 Exudative age-related macular degeneration, right eye, with active choroidal neovascularization: Secondary | ICD-10-CM | POA: Diagnosis not present

## 2017-08-07 DIAGNOSIS — H353223 Exudative age-related macular degeneration, left eye, with inactive scar: Secondary | ICD-10-CM | POA: Diagnosis not present

## 2017-08-07 DIAGNOSIS — H35373 Puckering of macula, bilateral: Secondary | ICD-10-CM | POA: Diagnosis not present

## 2017-08-07 DIAGNOSIS — H43813 Vitreous degeneration, bilateral: Secondary | ICD-10-CM | POA: Diagnosis not present

## 2017-08-07 DIAGNOSIS — H353211 Exudative age-related macular degeneration, right eye, with active choroidal neovascularization: Secondary | ICD-10-CM | POA: Diagnosis not present

## 2017-08-12 DIAGNOSIS — R0602 Shortness of breath: Secondary | ICD-10-CM | POA: Diagnosis not present

## 2017-08-12 DIAGNOSIS — E782 Mixed hyperlipidemia: Secondary | ICD-10-CM | POA: Diagnosis not present

## 2017-08-12 DIAGNOSIS — I2510 Atherosclerotic heart disease of native coronary artery without angina pectoris: Secondary | ICD-10-CM | POA: Diagnosis not present

## 2017-08-12 DIAGNOSIS — I10 Essential (primary) hypertension: Secondary | ICD-10-CM | POA: Diagnosis not present

## 2017-08-12 DIAGNOSIS — I70213 Atherosclerosis of native arteries of extremities with intermittent claudication, bilateral legs: Secondary | ICD-10-CM | POA: Diagnosis not present

## 2017-10-03 DIAGNOSIS — H43813 Vitreous degeneration, bilateral: Secondary | ICD-10-CM | POA: Diagnosis not present

## 2017-10-03 DIAGNOSIS — H353211 Exudative age-related macular degeneration, right eye, with active choroidal neovascularization: Secondary | ICD-10-CM | POA: Diagnosis not present

## 2017-10-03 DIAGNOSIS — H353223 Exudative age-related macular degeneration, left eye, with inactive scar: Secondary | ICD-10-CM | POA: Diagnosis not present

## 2017-10-03 DIAGNOSIS — H35373 Puckering of macula, bilateral: Secondary | ICD-10-CM | POA: Diagnosis not present

## 2017-10-21 ENCOUNTER — Ambulatory Visit: Payer: Medicare Other | Admitting: Vascular Surgery

## 2017-11-25 DIAGNOSIS — H353112 Nonexudative age-related macular degeneration, right eye, intermediate dry stage: Secondary | ICD-10-CM | POA: Diagnosis not present

## 2017-12-12 DIAGNOSIS — H353223 Exudative age-related macular degeneration, left eye, with inactive scar: Secondary | ICD-10-CM | POA: Diagnosis not present

## 2017-12-12 DIAGNOSIS — H35373 Puckering of macula, bilateral: Secondary | ICD-10-CM | POA: Diagnosis not present

## 2017-12-12 DIAGNOSIS — H43813 Vitreous degeneration, bilateral: Secondary | ICD-10-CM | POA: Diagnosis not present

## 2017-12-12 DIAGNOSIS — H353211 Exudative age-related macular degeneration, right eye, with active choroidal neovascularization: Secondary | ICD-10-CM | POA: Diagnosis not present

## 2017-12-16 ENCOUNTER — Other Ambulatory Visit: Payer: Self-pay

## 2017-12-16 ENCOUNTER — Ambulatory Visit: Payer: Medicare Other | Admitting: Vascular Surgery

## 2017-12-16 DIAGNOSIS — I70209 Unspecified atherosclerosis of native arteries of extremities, unspecified extremity: Secondary | ICD-10-CM

## 2017-12-16 DIAGNOSIS — I724 Aneurysm of artery of lower extremity: Secondary | ICD-10-CM

## 2017-12-25 DIAGNOSIS — H353112 Nonexudative age-related macular degeneration, right eye, intermediate dry stage: Secondary | ICD-10-CM | POA: Diagnosis not present

## 2018-01-12 ENCOUNTER — Ambulatory Visit (INDEPENDENT_AMBULATORY_CARE_PROVIDER_SITE_OTHER): Payer: Medicare Other | Admitting: Medical

## 2018-01-12 ENCOUNTER — Ambulatory Visit (HOSPITAL_BASED_OUTPATIENT_CLINIC_OR_DEPARTMENT_OTHER)
Admission: RE | Admit: 2018-01-12 | Discharge: 2018-01-12 | Disposition: A | Discharge status: Home / Self Care | Payer: Medicare Other | Source: Ambulatory Visit | Attending: Medical | Admitting: Medical

## 2018-01-12 ENCOUNTER — Encounter: Payer: Self-pay | Admitting: Medical

## 2018-01-12 VITALS — BP 110/58 | HR 92 | Temp 98.0°F | Resp 16 | Ht 68.0 in | Wt 133.8 lb

## 2018-01-12 DIAGNOSIS — R062 Wheezing: Secondary | ICD-10-CM | POA: Insufficient documentation

## 2018-01-12 DIAGNOSIS — R059 Cough, unspecified: Secondary | ICD-10-CM

## 2018-01-12 DIAGNOSIS — Z8679 Personal history of other diseases of the circulatory system: Secondary | ICD-10-CM

## 2018-01-12 DIAGNOSIS — J40 Bronchitis, not specified as acute or chronic: Secondary | ICD-10-CM | POA: Diagnosis not present

## 2018-01-12 DIAGNOSIS — R918 Other nonspecific abnormal finding of lung field: Secondary | ICD-10-CM | POA: Insufficient documentation

## 2018-01-12 DIAGNOSIS — R05 Cough: Secondary | ICD-10-CM | POA: Diagnosis not present

## 2018-01-12 DIAGNOSIS — J439 Emphysema, unspecified: Secondary | ICD-10-CM | POA: Diagnosis not present

## 2018-01-12 MED ORDER — AZITHROMYCIN 250 MG PO TABS: ORAL_TABLET | ORAL | 0 refills | Status: DC

## 2018-01-12 MED ORDER — ALBUTEROL SULFATE HFA 108 (90 BASE) MCG/ACT IN AERS: 2.0000 | INHALATION_SPRAY | Freq: Four times a day (QID) | RESPIRATORY_TRACT | 2 refills | Status: AC | PRN

## 2018-01-12 MED ORDER — BENZONATATE 100 MG PO CAPS: 100.0000 mg | ORAL_CAPSULE | Freq: Three times a day (TID) | ORAL | 0 refills | Status: DC | PRN

## 2018-01-12 NOTE — Patient Instructions (Addendum)
You appear to have bronchitis. Rest hydrate and tylenol for fever. I am prescribing cough medicine benzonatate, and  azithromycin antibiotic.   For recent wheezing, I am refilling your Ventolin inhaler.  If you are having to use your Ventolin inhaler frequently as discussed then please notify me and would consider adding tapered prednisone.  Please get chest x-ray today.  Also would like to get a CBC, bnp  and cmp.   Follow up in 7-10 days or as needed

## 2018-01-12 NOTE — Progress Notes (Signed)
Subjective:    Patient ID: Patrick [name], male    DOB: 1946-01-26, 72 y.o.   MRN: [id number]  HPI  Pt in today feeling some chest congestion for 3 days. He has been coughing up some clear/mild thick mucus. Pt states 2 years ago he had flu and then pneumonia. This lead to some chf. Pt does not note  any muscle aches. He is tired.   Pt can hear wheezing/whistles sound a times.  Hx of smoking and quite January 2017.  He has history of copd diagnosis.   Pt bp checks his bp at home. Recent 100/60. Pt appetite decreased recently.  In past when discharge time he had pneumonia discharge on ventolin.     Review of Systems  Constitutional: Negative for chills, diaphoresis and fatigue.  Respiratory: Positive for cough and wheezing. Negative for chest tightness.        Sob at times secondary to wheezing.  Chest congestion  Cardiovascular: Negative for chest pain and palpitations.  Gastrointestinal: Negative for abdominal pain, constipation, diarrhea and vomiting.  Musculoskeletal: Negative for back pain and joint swelling.  Skin: Negative for rash.  Neurological: Negative for dizziness, weakness and numbness.  Hematological: Negative for adenopathy. Does not bruise/bleed easily.  Psychiatric/Behavioral: Negative for confusion.    Past Medical History:  Diagnosis Date  . CAD (coronary artery disease)   . COPD (chronic obstructive pulmonary disease) ([place])   . Hyperlipidemia   . Hypertension      Social History   Socioeconomic History  . Marital status: Single    Spouse name: Not on file  . Number of children: Not on file  . Years of education: Not on file  . Highest education level: Not on file  Social Needs  . Financial resource strain: Not on file  . Food insecurity - worry: Not on file  . Food insecurity - inability: Not on file  . Transportation needs - medical: Not on file  . Transportation needs - non-medical: Not on file  Occupational History  . Not on file    Tobacco Use  . Smoking status: Former Smoker    Packs/day: 1.25    Years: 39.00    Pack years: 48.75    Types: Cigarettes    Last attempt to quit: 12/31/2015    Years since quitting: 2.0  . Smokeless tobacco: Never Used  Substance and Sexual Activity  . Alcohol use: No    Alcohol/week: 0.0 oz  . Drug use: No  . Sexual activity: No  Other Topics Concern  . Not on file  Social History Narrative  . Not on file    Past Surgical History:  Procedure Laterality Date  . CARDIAC CATHETERIZATION N/A 12/30/2015   Procedure: Left Heart Cath and Coronary Angiography;  Surgeon: Adrian Prows, MD;  Location: [facility];  Service: Cardiovascular;  Laterality: N/A;  . EYE SURGERY     Bilateral cataract sx 2017    Family History  Problem Relation Age of Onset  . Cancer Mother   . Cancer Father   . Diabetes Sister   . Diabetes Brother     Allergies  Allergen Reactions  . Aspirin Swelling  . Cephalexin Itching  . Ibuprofen Swelling    Current Outpatient Medications on File Prior to Visit  Medication Sig Dispense Refill  . atorvastatin (LIPITOR) 40 MG tablet Take 40 mg by mouth daily.    . carvedilol (COREG) 6.25 MG tablet Take 6.25 mg by mouth 2 (two) times  daily with a meal.  0  . cilostazol (PLETAL) 100 MG tablet Take 100 mg by mouth 2 (two) times daily.    . clopidogrel (PLAVIX) 75 MG tablet Take 75 mg by mouth daily.    Marland Kitchen dextromethorphan-guaiFENesin (MUCINEX DM) 30-600 MG 12hr tablet Take 1 tablet by mouth 2 (two) times daily. 40 tablet 0  . gabapentin (NEURONTIN) 100 MG capsule TAKE ONE CAPSULE BY MOUTH AT BEDTIME...PATIENT NEEDS OFFICE VISIT FOR REFILLS 30 capsule 0  . hydrochlorothiazide (HYDRODIURIL) 12.5 MG tablet   0  . Multiple Vitamin (MULTIVITAMIN WITH MINERALS) TABS tablet Take 1 tablet by mouth daily. Centrum Silver    . ranitidine (ZANTAC) 150 MG tablet TAKE ONE TABLET BY MOUTH TWICE DAILY 180 tablet 0  . tobramycin (TOBREX) 0.3 % ophthalmic solution   1  .  valsartan (DIOVAN) 320 MG tablet   2  . vitamin C (ASCORBIC ACID) 500 MG tablet Take 500 mg by mouth daily as needed (immune system boost).    . Vitamin D, Ergocalciferol, (DRISDOL) 50000 UNITS CAPS capsule Take 50,000 Units by mouth every 7 (seven) days. Wednesdays     No current facility-administered medications on file prior to visit.     BP (!) 110/58   Pulse 92   Temp 98 F (36.7 C) (Oral)   Resp 16   Ht 5\' 8"  (1.727 m)   Wt 133 lb 12.8 oz (60.7 kg)   SpO2 97%   BMI 20.34 kg/m       Objective:   Physical Exam  General  Mental Status - Alert. General Appearance - Well groomed. Not in acute distress.  Skin Rashes- No Rashes.  HEENT Head- Normal. Ear Auditory Canal - Left- Normal. Right - Normal.Tympanic Membrane- Left- Normal. Right- Normal. Eye Sclera/Conjunctiva- Left- Normal. Right- Normal. Nose & Sinuses Nasal Mucosa- Left-  Boggy and Congested. Right-  Boggy and  Congested.Bilateral maxillary and frontal sinus pressure. Mouth & Throat Lips: Upper Lip- Normal: no dryness, cracking, pallor, cyanosis, or vesicular eruption. Lower Lip-Normal: no dryness, cracking, pallor, cyanosis or vesicular eruption. Buccal Mucosa- Bilateral- No Aphthous ulcers. Oropharynx- No Discharge or Erythema. Tonsils: Characteristics- Bilateral- No Erythema or Congestion. Size/Enlargement- Bilateral- No enlargement. Discharge- bilateral-None.  Neck Neck- Supple. No Masses.   Chest and Lung Exam Auscultation: Breath Sounds:-Clear even and unlabored.  Cardiovascular Auscultation:Rythm- Regular, rate and rhythm. Murmurs & Other Heart Sounds:Ausculatation of the heart reveal- No Murmurs.  Lymphatic Head & Neck General Head & Neck Lymphatics: Bilateral: Description- No Localized lymphadenopathy.   Lower ext- no pedal edma. Negative homans signs.      Assessment & Plan:  You appear to have bronchitis. Rest hydrate and tylenol for fever. I am prescribing cough medicine  benzonatate, and  azithromycin antibiotic.   For recent wheezing, I am refilling your Ventolin inhaler.  If you are having to use your Ventolin inhaler frequently as discussed then please notify me and would consider adding tapered prednisone.  Please get chest x-ray today.  Also would like to get a CBC, bnp  and cmp. Since chf on problem dx decided to do bnp today.    Follow up in 7-10 days or as needed  Jie Stickels, Percell Miller, [employer]

## 2018-01-13 ENCOUNTER — Telehealth: Payer: Self-pay | Admitting: Medical

## 2018-01-13 DIAGNOSIS — J189 Pneumonia, unspecified organism: Secondary | ICD-10-CM

## 2018-01-13 DIAGNOSIS — R739 Hyperglycemia, unspecified: Secondary | ICD-10-CM

## 2018-01-13 LAB — CBC WITH DIFFERENTIAL/PLATELET
BASOS ABS: 0.1 10*3/uL (ref 0.0–0.1)
BASOS PCT: 0.5 % (ref 0.0–3.0)
Eosinophils Absolute: 0.2 10*3/uL (ref 0.0–0.7)
Eosinophils Relative: 1.4 % (ref 0.0–5.0)
HCT: 43.4 % (ref 39.0–52.0)
Hemoglobin: 14.6 g/dL (ref 13.0–17.0)
LYMPHS ABS: 1.1 10*3/uL (ref 0.7–4.0)
Lymphocytes Relative: 8.8 % — ABNORMAL LOW (ref 12.0–46.0)
MCHC: 33.6 g/dL (ref 30.0–36.0)
MCV: 93.9 fl (ref 78.0–100.0)
MONOS PCT: 10.5 % (ref 3.0–12.0)
Monocytes Absolute: 1.3 10*3/uL — ABNORMAL HIGH (ref 0.1–1.0)
NEUTROS ABS: 10 10*3/uL — AB (ref 1.4–7.7)
NEUTROS PCT: 78.8 % — AB (ref 43.0–77.0)
PLATELETS: 220 10*3/uL (ref 150.0–400.0)
RBC: 4.62 Mil/uL (ref 4.22–5.81)
RDW: 13 % (ref 11.5–15.5)
WBC: 12.7 10*3/uL — ABNORMAL HIGH (ref 4.0–10.5)

## 2018-01-13 LAB — COMPREHENSIVE METABOLIC PANEL
ALT: 6 U/L (ref 0–53)
AST: 8 U/L (ref 0–37)
Albumin: 4.3 g/dL (ref 3.5–5.2)
Alkaline Phosphatase: 73 U/L (ref 39–117)
BUN: 11 mg/dL (ref 6–23)
CHLORIDE: 100 meq/L (ref 96–112)
CO2: 31 meq/L (ref 19–32)
Calcium: 9.3 mg/dL (ref 8.4–10.5)
Creatinine, Ser: 0.82 mg/dL (ref 0.40–1.50)
GFR: 98.2 mL/min (ref >60.00)
GLUCOSE: 264 mg/dL — AB (ref 70–99)
POTASSIUM: 3.8 meq/L (ref 3.5–5.1)
Sodium: 138 mEq/L (ref 135–145)
Total Bilirubin: 0.8 mg/dL (ref 0.2–1.2)
Total Protein: 7.3 g/dL (ref 6.0–8.3)

## 2018-01-13 LAB — BRAIN NATRIURETIC PEPTIDE: Pro B Natriuretic peptide (BNP): 29 pg/mL (ref 0.0–100.0)

## 2018-01-13 NOTE — Telephone Encounter (Signed)
Pt. Called for lab results.  Only able to see chest xray results in the Result folder. The pt. Stated he read the chest xray result on MyChart. Stated his only question for the provider was how bad is the Emphysema.  Checked labs done yesterday;  B Nat Peptide, CMET, CBC.  Advised the pt. The labs have not been reviewed by the provider.  Will send message to the office. Agreed/

## 2018-01-13 NOTE — Telephone Encounter (Signed)
Future chest x-ray and labs placed.

## 2018-01-14 ENCOUNTER — Telehealth: Payer: Self-pay | Admitting: Medical

## 2018-01-14 ENCOUNTER — Telehealth: Payer: Self-pay

## 2018-01-14 MED ORDER — METFORMIN HCL 500 MG PO TABS: 500.0000 mg | ORAL_TABLET | Freq: Two times a day (BID) | ORAL | 3 refills | Status: DC

## 2018-01-14 NOTE — Telephone Encounter (Signed)
-----   Message from Mackie Pai, PA-C sent at 01/13/2018  5:53 PM EST ----- I tried to call you but had to leave a message.  I think you might have already may have seen  my chart message about probable pneumonia and plan to repeat the x-ray this coming Monday or earlier/before the weekend Thursday or Friday if you are not feeling better.  Also your infection fighting cells were elevated and plan to repeat those either on Monday or before.  Heart failure protein was not elevated.  Kidney function look good.  No anemia was seen.  Your blood sugar was 264 at the time of lab draw.  Sugar can tend to increase with infections and body.  I am going to prescribe metformin to help with your sugar levels.  And we will go ahead and put in 44-month blood sugar average test that she will get when chest x-ray and blood work is drawn.  If you are doing well then try to call on Friday and be put on the lab schedule for Monday.  X-ray department does not need to put you on the schedule.

## 2018-01-14 NOTE — Telephone Encounter (Signed)
Pt reviewed results on mychart. 

## 2018-01-14 NOTE — Telephone Encounter (Signed)
Talked to patient and advised of results. He is feeling a little better. He will call back or sent a Mychart message to let us know how he is doing and to get scheduled for additional labs and xray. Please enter order for future labs. (I didn't see a rx for Metformin in chart).

## 2018-01-14 NOTE — Telephone Encounter (Signed)
Metformin sent to pt pharmacy. 

## 2018-01-16 ENCOUNTER — Encounter: Payer: Self-pay | Admitting: Medical

## 2018-01-20 ENCOUNTER — Other Ambulatory Visit: Payer: Self-pay

## 2018-01-20 DIAGNOSIS — I724 Aneurysm of artery of lower extremity: Secondary | ICD-10-CM

## 2018-01-20 DIAGNOSIS — I739 Peripheral vascular disease, unspecified: Secondary | ICD-10-CM

## 2018-01-27 ENCOUNTER — Ambulatory Visit: Payer: Medicare Other | Admitting: Vascular Surgery

## 2018-02-03 ENCOUNTER — Other Ambulatory Visit: Payer: Medicare Other

## 2018-02-03 ENCOUNTER — Ambulatory Visit: Payer: Medicare Other | Admitting: Vascular Surgery

## 2018-03-10 ENCOUNTER — Ambulatory Visit: Payer: Medicare Other | Admitting: Vascular Surgery

## 2018-03-10 ENCOUNTER — Inpatient Hospital Stay: Admission: RE | Admit: 2018-03-10 | Payer: Medicare Other | Source: Ambulatory Visit

## 2018-03-10 DIAGNOSIS — H353211 Exudative age-related macular degeneration, right eye, with active choroidal neovascularization: Secondary | ICD-10-CM | POA: Diagnosis not present

## 2018-03-10 DIAGNOSIS — H43813 Vitreous degeneration, bilateral: Secondary | ICD-10-CM | POA: Diagnosis not present

## 2018-03-10 DIAGNOSIS — H353223 Exudative age-related macular degeneration, left eye, with inactive scar: Secondary | ICD-10-CM | POA: Diagnosis not present

## 2018-03-10 DIAGNOSIS — H35373 Puckering of macula, bilateral: Secondary | ICD-10-CM | POA: Diagnosis not present

## 2018-03-30 ENCOUNTER — Other Ambulatory Visit: Payer: Medicare Other

## 2018-04-02 ENCOUNTER — Other Ambulatory Visit: Payer: Self-pay

## 2018-04-02 MED ORDER — METFORMIN HCL 500 MG PO TABS: 500.0000 mg | ORAL_TABLET | Freq: Two times a day (BID) | ORAL | 1 refills | Status: DC

## 2018-04-10 DIAGNOSIS — I70213 Atherosclerosis of native arteries of extremities with intermittent claudication, bilateral legs: Secondary | ICD-10-CM | POA: Diagnosis not present

## 2018-04-15 DIAGNOSIS — I70213 Atherosclerosis of native arteries of extremities with intermittent claudication, bilateral legs: Secondary | ICD-10-CM | POA: Diagnosis not present

## 2018-04-15 DIAGNOSIS — I2510 Atherosclerotic heart disease of native coronary artery without angina pectoris: Secondary | ICD-10-CM | POA: Diagnosis not present

## 2018-04-15 DIAGNOSIS — R0602 Shortness of breath: Secondary | ICD-10-CM | POA: Diagnosis not present

## 2018-04-15 DIAGNOSIS — I10 Essential (primary) hypertension: Secondary | ICD-10-CM | POA: Diagnosis not present

## 2018-04-21 ENCOUNTER — Ambulatory Visit: Payer: Medicare Other | Admitting: Vascular Surgery

## 2018-04-27 DIAGNOSIS — R0602 Shortness of breath: Secondary | ICD-10-CM | POA: Diagnosis not present

## 2018-04-27 DIAGNOSIS — E118 Type 2 diabetes mellitus with unspecified complications: Secondary | ICD-10-CM | POA: Diagnosis not present

## 2018-04-27 DIAGNOSIS — I2510 Atherosclerotic heart disease of native coronary artery without angina pectoris: Secondary | ICD-10-CM | POA: Diagnosis not present

## 2018-04-27 DIAGNOSIS — E782 Mixed hyperlipidemia: Secondary | ICD-10-CM | POA: Diagnosis not present

## 2018-05-12 DIAGNOSIS — R0602 Shortness of breath: Secondary | ICD-10-CM | POA: Diagnosis not present

## 2018-05-12 DIAGNOSIS — I2510 Atherosclerotic heart disease of native coronary artery without angina pectoris: Secondary | ICD-10-CM | POA: Diagnosis not present

## 2018-05-19 DIAGNOSIS — R0602 Shortness of breath: Secondary | ICD-10-CM | POA: Diagnosis not present

## 2018-05-19 DIAGNOSIS — I2510 Atherosclerotic heart disease of native coronary artery without angina pectoris: Secondary | ICD-10-CM | POA: Diagnosis not present

## 2018-05-19 DIAGNOSIS — I70213 Atherosclerosis of native arteries of extremities with intermittent claudication, bilateral legs: Secondary | ICD-10-CM | POA: Diagnosis not present

## 2018-05-19 DIAGNOSIS — E782 Mixed hyperlipidemia: Secondary | ICD-10-CM | POA: Diagnosis not present

## 2018-05-26 DIAGNOSIS — H353223 Exudative age-related macular degeneration, left eye, with inactive scar: Secondary | ICD-10-CM | POA: Diagnosis not present

## 2018-05-26 DIAGNOSIS — H43813 Vitreous degeneration, bilateral: Secondary | ICD-10-CM | POA: Diagnosis not present

## 2018-05-26 DIAGNOSIS — H353211 Exudative age-related macular degeneration, right eye, with active choroidal neovascularization: Secondary | ICD-10-CM | POA: Diagnosis not present

## 2018-05-26 DIAGNOSIS — H35373 Puckering of macula, bilateral: Secondary | ICD-10-CM | POA: Diagnosis not present

## 2018-08-11 DIAGNOSIS — H43822 Vitreomacular adhesion, left eye: Secondary | ICD-10-CM | POA: Diagnosis not present

## 2018-08-11 DIAGNOSIS — H353223 Exudative age-related macular degeneration, left eye, with inactive scar: Secondary | ICD-10-CM | POA: Diagnosis not present

## 2018-08-11 DIAGNOSIS — H353211 Exudative age-related macular degeneration, right eye, with active choroidal neovascularization: Secondary | ICD-10-CM | POA: Diagnosis not present

## 2018-08-11 DIAGNOSIS — H35373 Puckering of macula, bilateral: Secondary | ICD-10-CM | POA: Diagnosis not present

## 2018-10-28 DIAGNOSIS — I739 Peripheral vascular disease, unspecified: Secondary | ICD-10-CM | POA: Diagnosis not present

## 2018-11-02 ENCOUNTER — Other Ambulatory Visit: Payer: Self-pay | Admitting: Medical

## 2018-11-02 NOTE — Telephone Encounter (Signed)
Pt due for follow up please call and schedule appointment.  

## 2018-11-03 DIAGNOSIS — H43822 Vitreomacular adhesion, left eye: Secondary | ICD-10-CM | POA: Diagnosis not present

## 2018-11-03 DIAGNOSIS — H353223 Exudative age-related macular degeneration, left eye, with inactive scar: Secondary | ICD-10-CM | POA: Diagnosis not present

## 2018-11-03 DIAGNOSIS — H353211 Exudative age-related macular degeneration, right eye, with active choroidal neovascularization: Secondary | ICD-10-CM | POA: Diagnosis not present

## 2018-11-03 DIAGNOSIS — H35373 Puckering of macula, bilateral: Secondary | ICD-10-CM | POA: Diagnosis not present

## 2018-11-03 NOTE — Telephone Encounter (Signed)
LVM to schedule a fu appt with provider, pt is due for fu appt for his next refill of meds.

## 2019-01-12 ENCOUNTER — Ambulatory Visit: Payer: Self-pay

## 2019-01-12 ENCOUNTER — Encounter: Payer: Self-pay | Admitting: Nurse Practitioner

## 2019-01-12 ENCOUNTER — Emergency Department (HOSPITAL_COMMUNITY): Payer: Medicare Other

## 2019-01-12 ENCOUNTER — Ambulatory Visit: Payer: Medicare Other | Admitting: Nurse Practitioner

## 2019-01-12 ENCOUNTER — Encounter (HOSPITAL_COMMUNITY): Payer: Self-pay

## 2019-01-12 ENCOUNTER — Inpatient Hospital Stay (HOSPITAL_COMMUNITY)
Admission: EM | Admit: 2019-01-12 | Discharge: 2019-01-18 | DRG: 871 | Disposition: A | Discharge status: Home / Self Care | Payer: Medicare Other | Attending: Internal Medicine | Admitting: Internal Medicine

## 2019-01-12 VITALS — BP 200/80 | HR 121 | Temp 99.7°F | Ht 68.0 in | Wt 129.2 lb

## 2019-01-12 DIAGNOSIS — Z7984 Long term (current) use of oral hypoglycemic drugs: Secondary | ICD-10-CM | POA: Diagnosis not present

## 2019-01-12 DIAGNOSIS — I10 Essential (primary) hypertension: Secondary | ICD-10-CM | POA: Diagnosis not present

## 2019-01-12 DIAGNOSIS — E43 Unspecified severe protein-calorie malnutrition: Secondary | ICD-10-CM | POA: Diagnosis present

## 2019-01-12 DIAGNOSIS — Z681 Body mass index (BMI) 19 or less, adult: Secondary | ICD-10-CM | POA: Diagnosis not present

## 2019-01-12 DIAGNOSIS — R3129 Other microscopic hematuria: Secondary | ICD-10-CM | POA: Diagnosis present

## 2019-01-12 DIAGNOSIS — R778 Other specified abnormalities of plasma proteins: Secondary | ICD-10-CM

## 2019-01-12 DIAGNOSIS — R7989 Other specified abnormal findings of blood chemistry: Secondary | ICD-10-CM

## 2019-01-12 DIAGNOSIS — Z23 Encounter for immunization: Secondary | ICD-10-CM | POA: Diagnosis not present

## 2019-01-12 DIAGNOSIS — Z833 Family history of diabetes mellitus: Secondary | ICD-10-CM

## 2019-01-12 DIAGNOSIS — J9601 Acute respiratory failure with hypoxia: Secondary | ICD-10-CM | POA: Diagnosis not present

## 2019-01-12 DIAGNOSIS — R05 Cough: Secondary | ICD-10-CM

## 2019-01-12 DIAGNOSIS — I2510 Atherosclerotic heart disease of native coronary artery without angina pectoris: Secondary | ICD-10-CM | POA: Diagnosis not present

## 2019-01-12 DIAGNOSIS — J189 Pneumonia, unspecified organism: Secondary | ICD-10-CM | POA: Diagnosis present

## 2019-01-12 DIAGNOSIS — Z87891 Personal history of nicotine dependence: Secondary | ICD-10-CM | POA: Diagnosis not present

## 2019-01-12 DIAGNOSIS — R0902 Hypoxemia: Secondary | ICD-10-CM

## 2019-01-12 DIAGNOSIS — J441 Chronic obstructive pulmonary disease with (acute) exacerbation: Secondary | ICD-10-CM

## 2019-01-12 DIAGNOSIS — E1151 Type 2 diabetes mellitus with diabetic peripheral angiopathy without gangrene: Secondary | ICD-10-CM | POA: Diagnosis present

## 2019-01-12 DIAGNOSIS — Z79899 Other long term (current) drug therapy: Secondary | ICD-10-CM | POA: Diagnosis not present

## 2019-01-12 DIAGNOSIS — R000 Tachycardia, unspecified: Secondary | ICD-10-CM

## 2019-01-12 DIAGNOSIS — E785 Hyperlipidemia, unspecified: Secondary | ICD-10-CM | POA: Diagnosis present

## 2019-01-12 DIAGNOSIS — Z7902 Long term (current) use of antithrombotics/antiplatelets: Secondary | ICD-10-CM

## 2019-01-12 DIAGNOSIS — R911 Solitary pulmonary nodule: Secondary | ICD-10-CM

## 2019-01-12 DIAGNOSIS — A419 Sepsis, unspecified organism: Secondary | ICD-10-CM | POA: Diagnosis present

## 2019-01-12 DIAGNOSIS — I248 Other forms of acute ischemic heart disease: Secondary | ICD-10-CM | POA: Diagnosis not present

## 2019-01-12 DIAGNOSIS — R059 Cough, unspecified: Secondary | ICD-10-CM

## 2019-01-12 DIAGNOSIS — R0602 Shortness of breath: Secondary | ICD-10-CM | POA: Diagnosis not present

## 2019-01-12 DIAGNOSIS — E876 Hypokalemia: Secondary | ICD-10-CM | POA: Diagnosis present

## 2019-01-12 DIAGNOSIS — J181 Lobar pneumonia, unspecified organism: Secondary | ICD-10-CM | POA: Diagnosis not present

## 2019-01-12 DIAGNOSIS — J440 Chronic obstructive pulmonary disease with acute lower respiratory infection: Secondary | ICD-10-CM | POA: Diagnosis present

## 2019-01-12 DIAGNOSIS — I739 Peripheral vascular disease, unspecified: Secondary | ICD-10-CM | POA: Diagnosis present

## 2019-01-12 DIAGNOSIS — Z881 Allergy status to other antibiotic agents status: Secondary | ICD-10-CM | POA: Diagnosis not present

## 2019-01-12 DIAGNOSIS — R0689 Other abnormalities of breathing: Secondary | ICD-10-CM | POA: Diagnosis not present

## 2019-01-12 LAB — CBC WITH DIFFERENTIAL/PLATELET
Abs Immature Granulocytes: 0.14 10*3/uL — ABNORMAL HIGH (ref 0.00–0.07)
Basophils Absolute: 0 10*3/uL (ref 0.0–0.1)
Basophils Relative: 0 %
Eosinophils Absolute: 0 10*3/uL (ref 0.0–0.5)
Eosinophils Relative: 0 %
HEMATOCRIT: 44.7 % (ref 39.0–52.0)
Hemoglobin: 15.2 g/dL (ref 13.0–17.0)
Immature Granulocytes: 1 %
Lymphocytes Relative: 4 %
Lymphs Abs: 0.8 10*3/uL (ref 0.7–4.0)
MCH: 30.5 pg (ref 26.0–34.0)
MCHC: 34 g/dL (ref 30.0–36.0)
MCV: 89.8 fL (ref 80.0–100.0)
Monocytes Absolute: 1.5 10*3/uL — ABNORMAL HIGH (ref 0.1–1.0)
Monocytes Relative: 8 %
NEUTROS ABS: 15.9 10*3/uL — AB (ref 1.7–7.7)
Neutrophils Relative %: 87 %
Platelets: 162 10*3/uL (ref 150–400)
RBC: 4.98 MIL/uL (ref 4.22–5.81)
RDW: 12.2 % (ref 11.5–15.5)
WBC: 18.5 10*3/uL — ABNORMAL HIGH (ref 4.0–10.5)
nRBC: 0 % (ref 0.0–0.2)

## 2019-01-12 LAB — URINALYSIS, ROUTINE W REFLEX MICROSCOPIC
BILIRUBIN URINE: NEGATIVE
Bacteria, UA: NONE SEEN
GLUCOSE, UA: NEGATIVE mg/dL
Ketones, ur: NEGATIVE mg/dL
LEUKOCYTES UA: NEGATIVE
Nitrite: NEGATIVE
Protein, ur: 100 mg/dL — AB
Specific Gravity, Urine: 1.034 — ABNORMAL HIGH (ref 1.005–1.030)
pH: 5 (ref 5.0–8.0)

## 2019-01-12 LAB — D-DIMER, QUANTITATIVE: D-Dimer, Quant: 4.08 ug/mL-FEU — ABNORMAL HIGH (ref 0.00–0.50)

## 2019-01-12 LAB — BASIC METABOLIC PANEL
Anion gap: 15 (ref 5–15)
BUN: 14 mg/dL (ref 8–23)
CO2: 24 mmol/L (ref 22–32)
Calcium: 9.5 mg/dL (ref 8.9–10.3)
Chloride: 99 mmol/L (ref 98–111)
Creatinine, Ser: 1.01 mg/dL (ref 0.61–1.24)
GFR calc Af Amer: >60 mL/min (ref >60)
GFR calc non Af Amer: >60 mL/min (ref >60)
GLUCOSE: 192 mg/dL — AB (ref 70–99)
Potassium: 3.6 mmol/L (ref 3.5–5.1)
Sodium: 138 mmol/L (ref 135–145)

## 2019-01-12 LAB — INFLUENZA PANEL BY PCR (TYPE A & B)
Influenza A By PCR: NEGATIVE
Influenza B By PCR: NEGATIVE

## 2019-01-12 LAB — TROPONIN I: Troponin I: 0.04 ng/mL (ref <0.03)

## 2019-01-12 LAB — BRAIN NATRIURETIC PEPTIDE: B Natriuretic Peptide: 156.6 pg/mL — ABNORMAL HIGH (ref 0.0–100.0)

## 2019-01-12 LAB — I-STAT CG4 LACTIC ACID, ED: LACTIC ACID, VENOUS: 1.8 mmol/L (ref 0.5–1.9)

## 2019-01-12 MED ORDER — INSULIN ASPART 100 UNIT/ML ~~LOC~~ SOLN: 0.0000 [IU] | Freq: Three times a day (TID) | SUBCUTANEOUS | Status: DC

## 2019-01-12 MED ORDER — SODIUM CHLORIDE 0.9 % IV SOLN
500.0000 mg | INTRAVENOUS | Status: DC
  Administered 2019-01-13 – 2019-01-17 (×5): 500 mg via INTRAVENOUS
  Filled 2019-01-12 (×6): qty 500

## 2019-01-12 MED ORDER — VANCOMYCIN HCL IN DEXTROSE 1-5 GM/200ML-% IV SOLN
1000.0000 mg | Freq: Once | INTRAVENOUS | Status: AC
  Administered 2019-01-12: 1000 mg via INTRAVENOUS
  Filled 2019-01-12: qty 200

## 2019-01-12 MED ORDER — CILOSTAZOL 50 MG PO TABS
50.0000 mg | ORAL_TABLET | Freq: Two times a day (BID) | ORAL | Status: DC
  Administered 2019-01-12 – 2019-01-18 (×12): 50 mg via ORAL
  Filled 2019-01-12 (×12): qty 1

## 2019-01-12 MED ORDER — IPRATROPIUM-ALBUTEROL 0.5-2.5 (3) MG/3ML IN SOLN
3.0000 mL | Freq: Four times a day (QID) | RESPIRATORY_TRACT | Status: DC
  Administered 2019-01-12: 3 mL via RESPIRATORY_TRACT

## 2019-01-12 MED ORDER — CLOPIDOGREL BISULFATE 75 MG PO TABS
75.0000 mg | ORAL_TABLET | Freq: Every day | ORAL | Status: DC
  Administered 2019-01-13 – 2019-01-18 (×6): 75 mg via ORAL
  Filled 2019-01-12 (×6): qty 1

## 2019-01-12 MED ORDER — VANCOMYCIN HCL IN DEXTROSE 1-5 GM/200ML-% IV SOLN: 1000.0000 mg | INTRAVENOUS | Status: DC

## 2019-01-12 MED ORDER — ACETAMINOPHEN 650 MG RE SUPP: 650.0000 mg | Freq: Four times a day (QID) | RECTAL | Status: DC | PRN

## 2019-01-12 MED ORDER — ATORVASTATIN CALCIUM 40 MG PO TABS
40.0000 mg | ORAL_TABLET | Freq: Every day | ORAL | Status: DC
  Administered 2019-01-13 – 2019-01-18 (×6): 40 mg via ORAL
  Filled 2019-01-12 (×6): qty 1

## 2019-01-12 MED ORDER — SODIUM CHLORIDE 0.9 % IV SOLN
2.0000 g | Freq: Once | INTRAVENOUS | Status: AC
  Administered 2019-01-12: 2 g via INTRAVENOUS
  Filled 2019-01-12: qty 2

## 2019-01-12 MED ORDER — DM-GUAIFENESIN ER 30-600 MG PO TB12
2.0000 | ORAL_TABLET | Freq: Two times a day (BID) | ORAL | Status: DC
  Administered 2019-01-12 – 2019-01-18 (×12): 2 via ORAL
  Filled 2019-01-12 (×2): qty 2
  Filled 2019-01-12: qty 1
  Filled 2019-01-12 (×5): qty 2
  Filled 2019-01-12: qty 1
  Filled 2019-01-12 (×2): qty 2
  Filled 2019-01-12: qty 1

## 2019-01-12 MED ORDER — SODIUM CHLORIDE 0.9 % IV SOLN
INTRAVENOUS | Status: DC
  Administered 2019-01-12 – 2019-01-13 (×2): via INTRAVENOUS

## 2019-01-12 MED ORDER — SODIUM CHLORIDE 0.9 % IV SOLN
1.0000 g | Freq: Three times a day (TID) | INTRAVENOUS | Status: DC
  Administered 2019-01-13 – 2019-01-18 (×16): 1 g via INTRAVENOUS
  Filled 2019-01-12 (×19): qty 1

## 2019-01-12 MED ORDER — IPRATROPIUM-ALBUTEROL 0.5-2.5 (3) MG/3ML IN SOLN
3.0000 mL | Freq: Once | RESPIRATORY_TRACT | Status: AC
  Administered 2019-01-12: 3 mL via RESPIRATORY_TRACT
  Filled 2019-01-12: qty 3

## 2019-01-12 MED ORDER — METRONIDAZOLE IN NACL 5-0.79 MG/ML-% IV SOLN
500.0000 mg | Freq: Three times a day (TID) | INTRAVENOUS | Status: DC
  Administered 2019-01-12: 500 mg via INTRAVENOUS
  Filled 2019-01-12: qty 100

## 2019-01-12 MED ORDER — ACETAMINOPHEN 325 MG PO TABS: 650.0000 mg | ORAL_TABLET | Freq: Four times a day (QID) | ORAL | Status: DC | PRN

## 2019-01-12 MED ORDER — SODIUM CHLORIDE 0.9 % IV BOLUS
1000.0000 mL | Freq: Once | INTRAVENOUS | Status: AC
  Administered 2019-01-12: 1000 mL via INTRAVENOUS

## 2019-01-12 MED ORDER — IOPAMIDOL (ISOVUE-370) INJECTION 76%
100.0000 mL | Freq: Once | INTRAVENOUS | Status: AC | PRN
  Administered 2019-01-12: 100 mL via INTRAVENOUS

## 2019-01-12 MED ORDER — SODIUM CHLORIDE 0.9 % IV SOLN: 2.0000 g | Freq: Once | INTRAVENOUS | Status: DC

## 2019-01-12 MED ORDER — ENOXAPARIN SODIUM 40 MG/0.4ML ~~LOC~~ SOLN
40.0000 mg | SUBCUTANEOUS | Status: DC
  Administered 2019-01-13 – 2019-01-18 (×6): 40 mg via SUBCUTANEOUS
  Filled 2019-01-12 (×7): qty 0.4

## 2019-01-12 MED ORDER — SODIUM CHLORIDE 0.9 % IV SOLN
500.0000 mg | Freq: Once | INTRAVENOUS | Status: AC
  Administered 2019-01-12: 500 mg via INTRAVENOUS
  Filled 2019-01-12: qty 500

## 2019-01-12 NOTE — ED Provider Notes (Signed)
Emergency Department Provider Note   I have reviewed the triage vital signs and the nursing notes.   HISTORY  Chief Complaint Cough   HPI Patrick [name] is a 73 y.o. male with PMH of CAD, COPD, HLD, and HTN presents to the ED with SOB and cough for the last 3 days. Patient by EMS from his PCPs office.  While there, he was noted to have hypoxemia into the 70s with coughing and some wheezing.  He was given a nebulizer treatment in the PCPs office and transported by EMS.  No steroid or additional nebulizer was given.  Patient was maintained on 2 L nasal cannula initially.  The patient denies any associated chest pain or tightness.  He has had a nonproductive cough with "low grade" fever of 99 F at the PCP.  No body aches, headache, sore throat, or other flulike symptoms.  The patient was diagnosed with COPD and states he was using his nebulizer inhaler for the first few months after diagnosis but has not felt like he is needed them until 3 days ago.  No sick contacts.  No recent hospitalizations.   Past Medical History:  Diagnosis Date  . CAD (coronary artery disease)   . COPD (chronic obstructive pulmonary disease) ([place])   . Hyperlipidemia   . Hypertension     Patient Active Problem List   Diagnosis Date Noted  . Acute respiratory failure with hypoxia ([place]) 01/12/2019  . Upper airway cough syndrome 01/30/2016  . COPD GOLD III copd  01/30/2016  . Shortness of breath 12/30/2015  . Demand ischemia ([place]) 12/30/2015  . Sepsis ([place]) 12/30/2015  . Influenza-like illness 12/30/2015  . Acute systolic CHF (congestive heart failure) ([place])   . Atherosclerotic peripheral vascular disease ([place]) 03/17/2014  . Occlusion and stenosis of carotid artery without mention of cerebral infarction 09/15/2013  . PAD (peripheral artery disease) ([place]) 09/15/2013  . Tobacco use disorder 09/10/2013  . Coronary atherosclerosis of native coronary artery 09/10/2013  . HYPERLIPIDEMIA 04/22/2008  . Essential  hypertension 04/22/2008  . CARCINOMA, BLADDER, HX OF 04/22/2008  . DIVERTICULOSIS, COLON 07/20/2007    Past Surgical History:  Procedure Laterality Date  . CARDIAC CATHETERIZATION N/A 12/30/2015   Procedure: Left Heart Cath and Coronary Angiography;  Surgeon: Adrian Prows, MD;  Location: [facility];  Service: Cardiovascular;  Laterality: N/A;  . EYE SURGERY     Bilateral cataract sx 2017   Allergies Aspirin; Cephalexin; and Ibuprofen  Family History  Problem Relation Age of Onset  . Cancer Mother   . Cancer Father   . Diabetes Sister   . Diabetes Brother     Social History Social History   Tobacco Use  . Smoking status: Former Smoker    Packs/day: 1.25    Years: 39.00    Pack years: 48.75    Types: Cigarettes    Last attempt to quit: 12/31/2015    Years since quitting: 3.0  . Smokeless tobacco: Never Used  Substance Use Topics  . Alcohol use: No    Alcohol/week: 0.0 standard drinks  . Drug use: No    Review of Systems  Constitutional: No fever/chills Eyes: No visual changes. ENT: No sore throat. Cardiovascular: Denies chest pain. Respiratory: Positive shortness of breath and cough.  Gastrointestinal: No abdominal pain.  No nausea, no vomiting.  No diarrhea.  No constipation. Genitourinary: Negative for dysuria. Musculoskeletal: Negative for back pain. Skin: Negative for rash. Neurological: Negative for headaches, focal weakness or numbness.  10-point  ROS otherwise negative.  ____________________________________________   PHYSICAL EXAM:  VITAL SIGNS: ED Triage Vitals  Enc Vitals Group     BP 01/12/19 1648 109/64     Pulse Rate 01/12/19 1648 (!) 119     Resp 01/12/19 1648 (!) 24     Temp 01/12/19 1648 99.8 F (37.7 C)     Temp Source 01/12/19 1648 Oral     SpO2 01/12/19 1648 (!) 87 %     Pain Score 01/12/19 1649 0   Constitutional: Alert and oriented. Well appearing and in no acute distress. Eyes: Conjunctivae are normal.  Head:  Atraumatic. Nose: No congestion/rhinnorhea. Mouth/Throat: Mucous membranes are moist.  Neck: No stridor.  Cardiovascular: Tachycardia. Good peripheral circulation. Grossly normal heart sounds.   Respiratory: Positive respiratory effort.  No retractions. Lungs with coarse sounds at the bases, worse on the right.  Gastrointestinal: Soft and nontender. No distention.  Musculoskeletal: No lower extremity tenderness nor edema. No gross deformities of extremities. Neurologic:  Normal speech and language. No gross focal neurologic deficits are appreciated.  Skin:  Skin is warm, dry and intact. No rash noted.  ____________________________________________   LABS (all labs ordered are listed, but only abnormal results are displayed)  Labs Reviewed  BASIC METABOLIC PANEL - Abnormal; Notable for the following components:      Result Value   Glucose, Bld 192 (*)    All other components within normal limits  CBC WITH DIFFERENTIAL/PLATELET - Abnormal; Notable for the following components:   WBC 18.5 (*)    Neutro Abs 15.9 (*)    Monocytes Absolute 1.5 (*)    Abs Immature Granulocytes 0.14 (*)    All other components within normal limits  TROPONIN I - Abnormal; Notable for the following components:   Troponin I 0.04 (*)    All other components within normal limits  BRAIN NATRIURETIC PEPTIDE - Abnormal; Notable for the following components:   B Natriuretic Peptide 156.6 (*)    All other components within normal limits  D-DIMER, QUANTITATIVE (NOT AT [facility]) - Abnormal; Notable for the following components:   D-Dimer, Quant 4.08 (*)    All other components within normal limits  CULTURE, BLOOD (ROUTINE X 2)  CULTURE, BLOOD (ROUTINE X 2)  INFLUENZA PANEL BY PCR (TYPE A & B)  I-STAT CG4 LACTIC ACID, ED  I-STAT CG4 LACTIC ACID, ED   ____________________________________________  EKG   EKG Interpretation  Date/Time:  Tuesday January 12 2019 17:59:56 EST Ventricular Rate:  111 PR Interval:     QRS Duration: 80 QT Interval:  330 QTC Calculation: 449 R Axis:   84 Text Interpretation:  Sinus tachycardia Anteroseptal infarct, age indeterminate No STEMI.  Confirmed by Nanda Quinton [id number]) on 01/12/2019 6:03:16 PM       ____________________________________________  RADIOLOGY  Dg Chest 2 View  Result Date: 01/12/2019 CLINICAL DATA:  Shortness of breath productive cough for the past 4 days. History of chronic lung disease and COPD. EXAM: CHEST - 2 VIEW COMPARISON:  Earlier today. 01/30/2016. FINDINGS: Normal sized heart. Better defined area of increased density in the left mid lung zone since earlier today, not present on 01/30/2016. This has a nodular component inferiorly and appears to have a cavitary component superiorly. Together, this area measures 2.3 x 2.2 cm. The lungs remain hyperexpanded with mild diffuse peribronchial thickening and accentuation of the interstitial markings. Mild thoracic spine degenerative changes. IMPRESSION: 1. Better defined area of increased density in the left mid lung zone since earlier today, not  present on 01/30/2016. This has a probable cavitary component superiorly and nodular component inferiorly. This could be infectious or neoplastic in nature. Further evaluation with a chest CT with contrast is recommended. 2. Stable changes of COPD and chronic bronchitis. Electronically Signed   By: Claudie Revering M.D.   On: 01/12/2019 18:42   Ct Angio Chest Pe W And/or Wo Contrast  Result Date: 01/12/2019 CLINICAL DATA:  Three day history of cough. Shortness of breath with exertion. Focal lesion seen in the left mid lung on prior chest radiograph. EXAM: CT ANGIOGRAPHY CHEST WITH CONTRAST TECHNIQUE: Multidetector CT imaging of the chest was performed using the standard protocol during bolus administration of intravenous contrast. Multiplanar CT image reconstructions and MIPs were obtained to evaluate the vascular anatomy. CONTRAST:  133mL ISOVUE-370 IOPAMIDOL  (ISOVUE-370) INJECTION 76% COMPARISON:  Chest radiograph 01/12/2019 FINDINGS: Cardiovascular: There is good append sick a shin of the central and segmental pulmonary arteries. No focal filling defects. No evidence of significant pulmonary embolus. Normal heart size. No pericardial effusion. Mild anterior pericardial thickening. Calcification of the aorta. Normal caliber. Great vessel origins are patent. Mediastinum/Nodes: Prominent pretracheal and subcarinal lymph nodes, measuring up to about 1.6 cm short axis dimension. Lungs/Pleura: Diffuse emphysematous changes throughout the lungs. Diffuse peribronchial thickening with opacification or mucous plugging of some of the lower lobe bronchi bilaterally, suggesting acute or chronic bronchitis or inflammatory airways disease. There is patchy infiltration in the right lung base posteriorly which may represent focal pneumonia. In the left inferior upper lung, corresponding to the chest x-ray abnormality, there is a circumscribed nodule measuring 1.6 x 1.2 cm in diameter. There are adjacent cystic changes likely representing adjacent emphysematous change rather than cavitation of the lesion itself. Tiny adjacent peribronchovascular nodules are also present. This lesion is indeterminate and could be inflammatory or neoplastic in nature. Suspicious for neoplasm since it was present on previous chest x-ray 1 year ago, with interval more prominent appearance. No pleural effusions. No pneumothorax. Upper Abdomen: Exophytic lesion off of the midpole left kidney probably represents a cyst. No acute changes demonstrated in the visualized upper abdomen. Musculoskeletal: No chest wall abnormality. No acute or significant osseous findings. Review of the MIP images confirms the above findings. IMPRESSION: 1. No evidence of significant pulmonary embolus. 2. Diffuse emphysematous changes in the lungs. 3. 1.6 x 1.2 cm nodule in the left inferior upper lung corresponding to chest x-ray  abnormality. This is indeterminate and could represent inflammatory or neoplastic process. Tiny adjacent peribronchovascular nodules are present. Prominent mediastinal lymph nodes are nonspecific in could be metastatic or inflammatory. Consider one of the following in 3 months for both low-risk and high-risk individuals: (a) referral to chest clinic, (b) follow-up PET-CT, or (c) tissue sampling. This recommendation follows the consensus statement: Guidelines for Management of Incidental Pulmonary Nodules Detected on CT Images: From the Fleischner Society 2017; Radiology 2017; 284:228-243. 4. Diffuse peribronchial thickening with opacification or mucous plugging in the lower lobe bronchi bilaterally, suggesting acute or chronic bronchitis or inflammatory airways disease. 5. Focal consolidation in the right lower lung likely pneumonia. Emphysema (ICD10-J43.9). Electronically Signed   By: Lucienne Capers M.D.   On: 01/12/2019 20:22    ____________________________________________   PROCEDURES  Procedure(s) performed:   Procedures  CRITICAL CARE Performed by: Margette Fast Total critical care time: 35 minutes Critical care time was exclusive of separately billable procedures and treating other patients. Critical care was necessary to treat or prevent imminent or life-threatening deterioration. Critical care was time  spent personally by me on the following activities: development of treatment plan with patient and/or surrogate as well as nursing, discussions with consultants, evaluation of patient's response to treatment, examination of patient, obtaining history from patient or surrogate, ordering and performing treatments and interventions, ordering and review of laboratory studies, ordering and review of radiographic studies, pulse oximetry and re-evaluation of patient's condition.  Nanda Quinton, MD Emergency Medicine  ____________________________________________   INITIAL IMPRESSION /  ASSESSMENT AND PLAN / ED COURSE  Pertinent labs & imaging results that were available during my care of the patient were reviewed by me and considered in my medical decision making (see chart for details).  With PMH of COPD presents to the ED with hypoxemia, SOB, and cough. No significant infection type symptoms. Patient is requiring supplemental O2 but not requiring additional airway support. Plan for Neb here, labs, CXR, and reassess.   07:00 PM Patient CXR with worsening infiltrate and possible cavitary component. CTA ordered with D-dimer elevated. Troponin mildly elevated but extremely low suspicion for primary ACS. Updated patient. Doing well on [place] O2. Send cultures and lactate. No increased WOB. Consulted with pharmacy for abx coverage with possible cavitary lesion. Will narrow, if able, after CT.   CT reviewed. No cavitary lesion. Patient does have right lobe PNA. Hospitalist to narrow abx from here. No PE.   Discussed patient's case with Hospitalist, Dr. Marlowe Sax to request admission. Patient and family (if present) updated with plan. Care transferred to Hospitalist service.  I reviewed all nursing notes, vitals, pertinent old records, EKGs, labs, imaging (as available). ____________________________________________  FINAL CLINICAL IMPRESSION(S) / ED DIAGNOSES  Final diagnoses:  Community acquired pneumonia of right lower lobe of lung ([place])  Cough  Hypoxemia     MEDICATIONS GIVEN DURING THIS VISIT:  Medications  azithromycin (ZITHROMAX) 500 mg in sodium chloride 0.9 % 250 mL IVPB (500 mg Intravenous New Bag/Given 01/12/19 2047)  metroNIDAZOLE (FLAGYL) IVPB 500 mg (has no administration in time range)  ceFEPIme (MAXIPIME) 2 g in sodium chloride 0.9 % 100 mL IVPB (2 g Intravenous New Bag/Given 01/12/19 2043)  ceFEPIme (MAXIPIME) 1 g in sodium chloride 0.9 % 100 mL IVPB (has no administration in time range)  azithromycin (ZITHROMAX) 500 mg in sodium chloride 0.9 % 250 mL IVPB (has  no administration in time range)  vancomycin (VANCOCIN) IVPB 1000 mg/200 mL premix (has no administration in time range)  ipratropium-albuterol (DUONEB) 0.5-2.5 (3) MG/3ML nebulizer solution 3 mL (3 mLs Nebulization Given 01/12/19 1726)  vancomycin (VANCOCIN) IVPB 1000 mg/200 mL premix (0 mg Intravenous Stopped 01/12/19 2044)  sodium chloride 0.9 % bolus 1,000 mL (1,000 mLs Intravenous New Bag/Given 01/12/19 1926)  iopamidol (ISOVUE-370) 76 % injection 100 mL (100 mLs Intravenous Contrast Given 01/12/19 1950)    Note:  This document was prepared using Dragon voice recognition software and may include unintentional dictation errors.  Nanda Quinton, MD Emergency Medicine    Long, [facility], MD 01/12/19 2107

## 2019-01-12 NOTE — ED Notes (Signed)
Pt transported to and from radiology via wheelchair, tolerated well.

## 2019-01-12 NOTE — ED Triage Notes (Signed)
Pt presents from PCP office with report of 3 day h/o of cough.  Pt reports phlegm is now yellow, reports shortness of breath with exertion.  Pt denies any pain. Duo-neb treatment at office.

## 2019-01-12 NOTE — Patient Instructions (Addendum)
Advised patient that he need to go to hospital for close monitoring and further evaluation due to hypoxia. He agreed. He was Transported to hospital via EMS.

## 2019-01-12 NOTE — Telephone Encounter (Signed)
Pt. Reports he started coughing 2 days ago. Was up all night coughing last night.Mucinex "helped some." Productive cough with amber mucus. Low grade temp. - 99. Pt. Has COPD and stent placement - followed by cardiology. States he has had pneumonia in the past. No availability at [employer] made at [employer] [facility].  Reason for Disposition . SEVERE coughing spells (e.g., whooping sound after coughing, vomiting after coughing)  Answer Assessment - Initial Assessment Questions 1. ONSET: "When did the cough begin?"      Started 2 days ago 2. SEVERITY: "How bad is the cough today?"      Severe - did not sleep 3. RESPIRATORY DISTRESS: "Describe your breathing."      Shortness of breath 4. FEVER: "Do you have a fever?" If so, ask: "What is your temperature, how was it measured, and when did it start?"     99 5. SPUTUM: "Describe the color of your sputum" (clear, white, yellow, green)     AMBER COLORED 6. HEMOPTYSIS: "Are you coughing up any blood?" If so ask: "How much?" (flecks, streaks, tablespoons, etc.)     No 7. CARDIAC HISTORY: "Do you have any history of heart disease?" (e.g., heart attack, congestive heart failure)      HTN, Stent 8. LUNG HISTORY: "Do you have any history of lung disease?"  (e.g., pulmonary embolus, asthma, emphysema)     COPD 9. PE RISK FACTORS: "Do you have a history of blood clots?" (or: recent major surgery, recent prolonged travel, bedridden)     No 10. OTHER SYMPTOMS: "Do you have any other symptoms?" (e.g., runny nose, wheezing, chest pain)       Runny nose 11. PREGNANCY: "Is there any chance you are pregnant?" "When was your last menstrual period?"       n/a 12. TRAVEL: "Have you traveled out of the country in the last month?" (e.g., travel history, exposures)       No  Protocols used: [place]

## 2019-01-12 NOTE — Progress Notes (Signed)
Subjective:  Patient ID: Patrick [name], male    DOB: 05-27-1946  Age: 73 y.o. MRN: [id number]  CC: Nasal Congestion (productive cough-- amber mucus, nasal congestion, chest hurting, SOB/ 4 days/ OTC musinex, tylenol)  Shortness of Breath  This is a new problem. The current episode started in the past 7 days. The problem occurs constantly. The problem has been rapidly worsening. Associated symptoms include a fever, orthopnea, PND, sputum production and wheezing. The symptoms are aggravated by lying flat, any activity and URIs. Risk factors include smoking. He has tried OTC cough suppressants for the symptoms. The treatment provided no relief. His past medical history is significant for chronic lung disease and COPD.   Reviewed past Medical, Social and Family history today.  Outpatient Medications Prior to Visit  Medication Sig Dispense Refill  . albuterol (PROVENTIL HFA;VENTOLIN HFA) 108 (90 Base) MCG/ACT inhaler Inhale 2 puffs into the lungs every 6 (six) hours as needed for wheezing or shortness of breath. 1 Inhaler 2  . atorvastatin (LIPITOR) 40 MG tablet Take 40 mg by mouth daily.    . carvedilol (COREG) 6.25 MG tablet Take 6.25 mg by mouth 2 (two) times daily with a meal.  0  . cilostazol (PLETAL) 100 MG tablet Take 100 mg by mouth 2 (two) times daily.    . clopidogrel (PLAVIX) 75 MG tablet Take 75 mg by mouth daily.    Marland Kitchen dextromethorphan-guaiFENesin (MUCINEX DM) 30-600 MG 12hr tablet Take 1 tablet by mouth 2 (two) times daily. 40 tablet 0  . hydrochlorothiazide (HYDRODIURIL) 12.5 MG tablet   0  . metFORMIN (GLUCOPHAGE) 500 MG tablet TAKE 1 TABLET BY MOUTH TWICE DAILY WITH A MEAL 60 tablet 0  . Multiple Vitamin (MULTIVITAMIN WITH MINERALS) TABS tablet Take 1 tablet by mouth daily. Centrum Silver    . tobramycin (TOBREX) 0.3 % ophthalmic solution   1  . valsartan (DIOVAN) 320 MG tablet   2  . vitamin C (ASCORBIC ACID) 500 MG tablet Take 500 mg by mouth daily as needed (immune system  boost).    . Vitamin D, Ergocalciferol, (DRISDOL) 50000 UNITS CAPS capsule Take 50,000 Units by mouth every 7 (seven) days. Wednesdays    . azithromycin (ZITHROMAX) 250 MG tablet Take 2 tablets by mouth on day 1, followed by 1 tablet by mouth daily for 4 days. (Patient not taking: Reported on 01/12/2019) 6 tablet 0  . benzonatate (TESSALON) 100 MG capsule Take 1 capsule (100 mg total) by mouth 3 (three) times daily as needed for cough. (Patient not taking: Reported on 01/12/2019) 21 capsule 0  . gabapentin (NEURONTIN) 100 MG capsule TAKE ONE CAPSULE BY MOUTH AT BEDTIME...PATIENT NEEDS OFFICE VISIT FOR REFILLS (Patient not taking: Reported on 01/12/2019) 30 capsule 0  . ranitidine (ZANTAC) 150 MG tablet TAKE ONE TABLET BY MOUTH TWICE DAILY (Patient not taking: Reported on 01/12/2019) 180 tablet 0   No facility-administered medications prior to visit.     ROS See HPI  Objective:  BP (!) 200/80   Pulse (!) 121   Temp 99.7 F (37.6 C) (Oral)   Ht 5\' 8"  (1.727 m)   Wt 129 lb 3.2 oz (58.6 kg)   SpO2 (!) 89%   BMI 19.64 kg/m   BP Readings from Last 3 Encounters:  01/12/19 (!) 200/80  01/12/18 (!) 110/58  01/23/17 (!) 110/58    Wt Readings from Last 3 Encounters:  01/12/19 129 lb 3.2 oz (58.6 kg)  01/12/18 133 lb 12.8 oz (60.7 kg)  01/23/17 137 lb 12.8 oz (62.5 kg)    Physical Exam Vitals signs reviewed.  Cardiovascular:     Rate and Rhythm: Normal rate.  Pulmonary:     Effort: Respiratory distress present.     Breath sounds: Wheezing and rales present.  Neurological:     Mental Status: He is alert and oriented to person, place, and time.     Lab Results  Component Value Date   WBC 12.7 (H) 01/12/2018   HGB 14.6 01/12/2018   HCT 43.4 01/12/2018   PLT 220.0 01/12/2018   GLUCOSE 264 (H) 01/12/2018   ALT 6 01/12/2018   AST 8 01/12/2018   NA 138 01/12/2018   K 3.8 01/12/2018   CL 100 01/12/2018   CREATININE 0.82 01/12/2018   BUN 11 01/12/2018   CO2 31 01/12/2018   INR  1.02 12/30/2015   HGBA1C 6.7 (H) 01/30/2016    Dg Chest 2 View  Result Date: 01/12/2018 CLINICAL DATA:  Cough and wheezing EXAM: CHEST  2 VIEW COMPARISON:  01/30/2016 FINDINGS: Hyperinflation with emphysematous disease. Vague focal opacity in the left upper lobe. No pleural effusion. Normal heart size. Aortic atherosclerosis. No pneumothorax. IMPRESSION: 1. Hyperinflation with emphysematous disease 2. Mild vague opacity in the left upper lobe, may reflect a small focus of inflammation or infiltrate. Short interval radiographic follow-up suggested. Electronically Signed   By: Donavan Foil M.D.   On: 01/12/2018 15:32    Assessment & Plan:   Lamberto was seen today for nasal congestion.  Diagnoses and all orders for this visit:  COPD with acute exacerbation ([place]) -     ipratropium-albuterol (DUONEB) 0.5-2.5 (3) MG/3ML nebulizer solution 3 mL  Hypoxia -     ipratropium-albuterol (DUONEB) 0.5-2.5 (3) MG/3ML nebulizer solution 3 mL  Tachycardia   I have discontinued Marcello Moores. Moen's ranitidine, gabapentin, azithromycin, and benzonatate. I am also having him maintain his clopidogrel, atorvastatin, cilostazol, Vitamin D (Ergocalciferol), multivitamin with minerals, vitamin C, dextromethorphan-guaiFENesin, valsartan, tobramycin, hydrochlorothiazide, carvedilol, albuterol, and metFORMIN. We administered ipratropium-albuterol. We will continue to administer ipratropium-albuterol.  Meds ordered this encounter  Medications  . ipratropium-albuterol (DUONEB) 0.5-2.5 (3) MG/3ML nebulizer solution 3 mL    Problem List Items Addressed This Visit    None    Visit Diagnoses    COPD with acute exacerbation ([place])    -  Primary   Relevant Medications   ipratropium-albuterol (DUONEB) 0.5-2.5 (3) MG/3ML nebulizer solution 3 mL (Start on 01/12/2019  4:00 PM)   Hypoxia       Relevant Medications   ipratropium-albuterol (DUONEB) 0.5-2.5 (3) MG/3ML nebulizer solution 3 mL (Start on 01/12/2019  4:00 PM)    Tachycardia           Follow-up: No follow-ups on file.  Wilfred Lacy, NP

## 2019-01-12 NOTE — Progress Notes (Addendum)
Pharmacy Antibiotic Note  Patrick [name] is a 73 y.o. male admitted on 01/12/2019 from PCP office with a 3-day history of productive cough and SOB.  CXR showed possible cavitary component vs neoplasm.  Pharmacy has been consulted for vancomycin and cefepime dosing for PNA.  Noted intolerance to Keflex.  Patient is also started on azithromycin and Flagyl due to CXR finding.  SCr 1.01, CrCL 55 ml/min, afebrile, WBC 18.5.   Plan: Vanc 1gm IV Q24H for AUC 475 using SCr 1.01 Cefepime 2gm IV x 1, then 1gm IV Q8H Azith 500mg  IV Q24H per MD Flagyl 500mg  IV Q8H per MD Monitor renal fxn, clinical progress, vanc AUC as indicated F/U CT to assess cavitary lesion and de-escalate as appropriate      Temp (24hrs), Avg:99.8 F (37.7 C), Min:99.7 F (37.6 C), Max:99.8 F (37.7 C)  Recent Labs  Lab 01/12/19 1649  WBC 18.5*  CREATININE 1.01    Estimated Creatinine Clearance: 54.8 mL/min (by C-G formula based on SCr of 1.01 mg/dL).    Allergies  Allergen Reactions  . Aspirin Swelling  . Cephalexin Itching  . Ibuprofen Swelling     Vanc 1/14 >> Cefepime 1/14 >> Flagyl 1/14 >> Azith 1/14 >>  1/14 BCx -  1/14 Flu A/B - negative  [name] D. Mina Marble, PharmD, BCPS, [place] 01/12/2019, 7:15 PM

## 2019-01-13 ENCOUNTER — Other Ambulatory Visit: Payer: Self-pay

## 2019-01-13 DIAGNOSIS — J181 Lobar pneumonia, unspecified organism: Secondary | ICD-10-CM | POA: Diagnosis not present

## 2019-01-13 DIAGNOSIS — R911 Solitary pulmonary nodule: Secondary | ICD-10-CM | POA: Diagnosis not present

## 2019-01-13 DIAGNOSIS — R7989 Other specified abnormal findings of blood chemistry: Secondary | ICD-10-CM | POA: Diagnosis not present

## 2019-01-13 DIAGNOSIS — R778 Other specified abnormalities of plasma proteins: Secondary | ICD-10-CM

## 2019-01-13 DIAGNOSIS — J9601 Acute respiratory failure with hypoxia: Secondary | ICD-10-CM | POA: Diagnosis not present

## 2019-01-13 DIAGNOSIS — E43 Unspecified severe protein-calorie malnutrition: Secondary | ICD-10-CM

## 2019-01-13 LAB — TROPONIN I
Troponin I: 0.03 ng/mL (ref <0.03)
Troponin I: 0.05 ng/mL (ref <0.03)
Troponin I: <0.03 ng/mL (ref <0.03)

## 2019-01-13 LAB — CBC
HCT: 36.7 % — ABNORMAL LOW (ref 39.0–52.0)
Hemoglobin: 12.6 g/dL — ABNORMAL LOW (ref 13.0–17.0)
MCH: 31.2 pg (ref 26.0–34.0)
MCHC: 34.3 g/dL (ref 30.0–36.0)
MCV: 90.8 fL (ref 80.0–100.0)
Platelets: 149 10*3/uL — ABNORMAL LOW (ref 150–400)
RBC: 4.04 MIL/uL — ABNORMAL LOW (ref 4.22–5.81)
RDW: 12.3 % (ref 11.5–15.5)
WBC: 14 10*3/uL — ABNORMAL HIGH (ref 4.0–10.5)
nRBC: 0 % (ref 0.0–0.2)

## 2019-01-13 LAB — GLUCOSE, CAPILLARY
Glucose-Capillary: 153 mg/dL — ABNORMAL HIGH (ref 70–99)
Glucose-Capillary: 161 mg/dL — ABNORMAL HIGH (ref 70–99)
Glucose-Capillary: 151 mg/dL — ABNORMAL HIGH (ref 70–99)
Glucose-Capillary: 171 mg/dL — ABNORMAL HIGH (ref 70–99)
Glucose-Capillary: 194 mg/dL — ABNORMAL HIGH (ref 70–99)

## 2019-01-13 LAB — HEMOGLOBIN A1C
Hgb A1c MFr Bld: 6.8 % — ABNORMAL HIGH (ref 4.8–5.6)
Mean Plasma Glucose: 148.46 mg/dL

## 2019-01-13 MED ORDER — ALBUTEROL SULFATE (2.5 MG/3ML) 0.083% IN NEBU: 3.0000 mL | INHALATION_SOLUTION | Freq: Four times a day (QID) | RESPIRATORY_TRACT | Status: DC | PRN

## 2019-01-13 MED ORDER — IPRATROPIUM-ALBUTEROL 0.5-2.5 (3) MG/3ML IN SOLN: 3.0000 mL | Freq: Four times a day (QID) | RESPIRATORY_TRACT | Status: DC | PRN

## 2019-01-13 MED ORDER — ENSURE ENLIVE PO LIQD
237.0000 mL | Freq: Two times a day (BID) | ORAL | Status: DC
  Administered 2019-01-14 – 2019-01-18 (×6): 237 mL via ORAL

## 2019-01-13 MED ORDER — INSULIN ASPART 100 UNIT/ML ~~LOC~~ SOLN
0.0000 [IU] | Freq: Every day | SUBCUTANEOUS | Status: DC
  Administered 2019-01-15 – 2019-01-17 (×2): 2 [IU] via SUBCUTANEOUS

## 2019-01-13 MED ORDER — SODIUM CHLORIDE 0.9 % IV SOLN
INTRAVENOUS | Status: DC | PRN
  Administered 2019-01-13: 1000 mL via INTRAVENOUS
  Administered 2019-01-13: 250 mL via INTRAVENOUS
  Administered 2019-01-14: 1000 mL via INTRAVENOUS

## 2019-01-13 MED ORDER — INSULIN ASPART 100 UNIT/ML ~~LOC~~ SOLN
0.0000 [IU] | Freq: Three times a day (TID) | SUBCUTANEOUS | Status: DC
  Administered 2019-01-13: 2 [IU] via SUBCUTANEOUS
  Administered 2019-01-14: 1 [IU] via SUBCUTANEOUS
  Administered 2019-01-14 (×2): 2 [IU] via SUBCUTANEOUS

## 2019-01-13 MED ORDER — IPRATROPIUM-ALBUTEROL 0.5-2.5 (3) MG/3ML IN SOLN
3.0000 mL | Freq: Four times a day (QID) | RESPIRATORY_TRACT | Status: DC
  Administered 2019-01-13 – 2019-01-16 (×12): 3 mL via RESPIRATORY_TRACT
  Filled 2019-01-13 (×11): qty 3
  Filled 2019-01-13: qty 36
  Filled 2019-01-13: qty 3

## 2019-01-13 NOTE — H&P (Signed)
History and Physical    THADDAEUS GRANJA SNK:[id number] DOB: Feb 04, 1946 DOA: 01/12/2019  PCP: Mackie Pai, PA-C Patient coming from: Home  Chief Complaint: Cough, dyspnea on exertion  HPI: Patrick [name] is a 73 y.o. male with medical history significant of CAD, COPD, hypertension, hyperlipidemia presenting to the hospital complaining of cough and dyspnea on exertion.  Patient reports having a 3 to 4-day history of minimally productive cough and dyspnea on exertion.  States symptoms have been getting progressively worse.  Reports having low-grade fevers and fatigue.  Denies having any chest pain or wheezing.  Denies having any abdominal pain, nausea, vomiting, or diarrhea.  States he is a former 1 pack/day x >35-year smoker, quit in 2017.  Review of Systems: As per HPI otherwise 10 point review of systems negative.  Past Medical History:  Diagnosis Date  . CAD (coronary artery disease)   . COPD (chronic obstructive pulmonary disease) ([place])   . Hyperlipidemia   . Hypertension     Past Surgical History:  Procedure Laterality Date  . CARDIAC CATHETERIZATION N/A 12/30/2015   Procedure: Left Heart Cath and Coronary Angiography;  Surgeon: Adrian Prows, MD;  Location: [facility];  Service: Cardiovascular;  Laterality: N/A;  . EYE SURGERY     Bilateral cataract sx 2017     reports that he quit smoking about 3 years ago. His smoking use included cigarettes. He has a 48.75 pack-year smoking history. He has never used smokeless tobacco. He reports that he does not drink alcohol or use drugs.  Allergies  Allergen Reactions  . Aspirin Swelling  . Cephalexin Itching  . Ibuprofen Swelling    Family History  Problem Relation Age of Onset  . Cancer Mother   . Cancer Father   . Diabetes Sister   . Diabetes Brother     Prior to Admission medications   Medication Sig Start Date End Date Taking? Authorizing Provider  atorvastatin (LIPITOR) 40 MG tablet Take 40 mg by mouth daily.   Yes  [provider]  carvedilol (COREG) 6.25 MG tablet Take 6.25 mg by mouth 2 (two) times daily with a meal. 12/27/16  Yes [provider]  cilostazol (PLETAL) 100 MG tablet Take 50 mg by mouth 2 (two) times daily.    Yes [provider]  clopidogrel (PLAVIX) 75 MG tablet Take 75 mg by mouth daily.   Yes [provider]  hydrochlorothiazide (HYDRODIURIL) 12.5 MG tablet Take 12.5 mg by mouth daily.  08/13/16  Yes [provider]  metFORMIN (GLUCOPHAGE) 500 MG tablet TAKE 1 TABLET BY MOUTH TWICE DAILY WITH A MEAL Patient taking differently: Take 500 mg by mouth 2 (two) times daily.  11/02/18  Yes Saguier, Percell Miller, PA-C  olmesartan (BENICAR) 40 MG tablet Take 40 mg by mouth daily.   Yes [provider]  Vitamin D, Ergocalciferol, (DRISDOL) 50000 UNITS CAPS capsule Take 50,000 Units by mouth every Monday.    Yes [provider]  albuterol (PROVENTIL HFA;VENTOLIN HFA) 108 (90 Base) MCG/ACT inhaler Inhale 2 puffs into the lungs every 6 (six) hours as needed for wheezing or shortness of breath. Patient not taking: Reported on 01/12/2019 01/12/18   Saguier, Percell Miller, PA-C  dextromethorphan-guaiFENesin [facility] DM) 30-600 MG 12hr tablet Take 1 tablet by mouth 2 (two) times daily. Patient not taking: Reported on 01/12/2019 01/04/16   Mendel Corning, MD    Physical Exam: Vitals:   01/12/19 2138 01/12/19 2200 01/12/19 2245 01/13/19 0100  BP: 121/64 117/62 Marland Kitchen)  117/53 (!) 121/56  Pulse: (!) 106 (!) 106 (!) 104 96  Resp: (!) 26   (!) 24  Temp:      TempSrc:      SpO2: 96% 93% 92% 93%    Physical Exam  Constitutional: He is oriented to person, place, and time. No distress.  Frail elderly male  HENT:  Head: Normocephalic.  Mouth/Throat: Oropharynx is clear and moist.  Eyes: Right eye exhibits no discharge. Left eye exhibits no discharge.  Neck: Neck supple.  Cardiovascular: Normal rate, regular rhythm and intact distal pulses.  Pulmonary/Chest:  Effort normal. No respiratory distress. He has no wheezes. He has no rales.  Speaking clearly in full sentences No increased work of breathing On 2 L supplemental oxygen Coarse breath sounds appreciated at the right lung base  Abdominal: Soft. Bowel sounds are normal. He exhibits no distension. There is no abdominal tenderness. There is no guarding.  Musculoskeletal:        General: No edema.  Neurological: He is alert and oriented to person, place, and time.  Skin: Skin is warm and dry. He is not diaphoretic.  Psychiatric: He has a normal mood and affect. His behavior is normal.     Labs on Admission: I have personally reviewed following labs and imaging studies  CBC: Recent Labs  Lab 01/12/19 1649  WBC 18.5*  NEUTROABS 15.9*  HGB 15.2  HCT 44.7  MCV 89.8  PLT 660   Basic Metabolic Panel: Recent Labs  Lab 01/12/19 1649  NA 138  K 3.6  CL 99  CO2 24  GLUCOSE 192*  BUN 14  CREATININE 1.01  CALCIUM 9.5   GFR: Estimated Creatinine Clearance: 54.8 mL/min (by C-G formula based on SCr of 1.01 mg/dL). Liver Function Tests: No results for input(s): AST, ALT, ALKPHOS, BILITOT, PROT, ALBUMIN in the last 168 hours. No results for input(s): LIPASE, AMYLASE in the last 168 hours. No results for input(s): AMMONIA in the last 168 hours. Coagulation Profile: No results for input(s): INR, PROTIME in the last 168 hours. Cardiac Enzymes: Recent Labs  Lab 01/12/19 1649 01/13/19 0020  TROPONINI 0.04* 0.03*   BNP (last 3 results) No results for input(s): PROBNP in the last 8760 hours. HbA1C: No results for input(s): HGBA1C in the last 72 hours. CBG: No results for input(s): GLUCAP in the last 168 hours. Lipid Profile: No results for input(s): CHOL, HDL, LDLCALC, TRIG, CHOLHDL, LDLDIRECT in the last 72 hours. Thyroid Function Tests: No results for input(s): TSH, T4TOTAL, FREET4, T3FREE, THYROIDAB in the last 72 hours. Anemia Panel: No results for input(s): VITAMINB12,  FOLATE, FERRITIN, TIBC, IRON, RETICCTPCT in the last 72 hours. Urine analysis:    Component Value Date/Time   COLORURINE AMBER (A) 01/12/2019 2142   APPEARANCEUR HAZY (A) 01/12/2019 2142   LABSPEC 1.034 (H) 01/12/2019 2142   PHURINE 5.0 01/12/2019 2142   GLUCOSEU NEGATIVE 01/12/2019 2142   HGBUR SMALL (A) 01/12/2019 2142   BILIRUBINUR NEGATIVE 01/12/2019 2142   KETONESUR NEGATIVE 01/12/2019 2142   PROTEINUR 100 (A) 01/12/2019 2142   NITRITE NEGATIVE 01/12/2019 2142   LEUKOCYTESUR NEGATIVE 01/12/2019 2142    Radiological Exams on Admission: Dg Chest 2 View  Result Date: 01/12/2019 CLINICAL DATA:  Shortness of breath productive cough for the past 4 days. History of chronic lung disease and COPD. EXAM: CHEST - 2 VIEW COMPARISON:  Earlier today. 01/30/2016. FINDINGS: Normal sized heart. Better defined area of increased density in the left mid lung zone since earlier today, not  present on 01/30/2016. This has a nodular component inferiorly and appears to have a cavitary component superiorly. Together, this area measures 2.3 x 2.2 cm. The lungs remain hyperexpanded with mild diffuse peribronchial thickening and accentuation of the interstitial markings. Mild thoracic spine degenerative changes. IMPRESSION: 1. Better defined area of increased density in the left mid lung zone since earlier today, not present on 01/30/2016. This has a probable cavitary component superiorly and nodular component inferiorly. This could be infectious or neoplastic in nature. Further evaluation with a chest CT with contrast is recommended. 2. Stable changes of COPD and chronic bronchitis. Electronically Signed   By: Claudie Revering M.D.   On: 01/12/2019 18:42   Ct Angio Chest Pe W And/or Wo Contrast  Result Date: 01/12/2019 CLINICAL DATA:  Three day history of cough. Shortness of breath with exertion. Focal lesion seen in the left mid lung on prior chest radiograph. EXAM: CT ANGIOGRAPHY CHEST WITH CONTRAST TECHNIQUE:  Multidetector CT imaging of the chest was performed using the standard protocol during bolus administration of intravenous contrast. Multiplanar CT image reconstructions and MIPs were obtained to evaluate the vascular anatomy. CONTRAST:  14mL ISOVUE-370 IOPAMIDOL (ISOVUE-370) INJECTION 76% COMPARISON:  Chest radiograph 01/12/2019 FINDINGS: Cardiovascular: There is good append sick a shin of the central and segmental pulmonary arteries. No focal filling defects. No evidence of significant pulmonary embolus. Normal heart size. No pericardial effusion. Mild anterior pericardial thickening. Calcification of the aorta. Normal caliber. Great vessel origins are patent. Mediastinum/Nodes: Prominent pretracheal and subcarinal lymph nodes, measuring up to about 1.6 cm short axis dimension. Lungs/Pleura: Diffuse emphysematous changes throughout the lungs. Diffuse peribronchial thickening with opacification or mucous plugging of some of the lower lobe bronchi bilaterally, suggesting acute or chronic bronchitis or inflammatory airways disease. There is patchy infiltration in the right lung base posteriorly which may represent focal pneumonia. In the left inferior upper lung, corresponding to the chest x-ray abnormality, there is a circumscribed nodule measuring 1.6 x 1.2 cm in diameter. There are adjacent cystic changes likely representing adjacent emphysematous change rather than cavitation of the lesion itself. Tiny adjacent peribronchovascular nodules are also present. This lesion is indeterminate and could be inflammatory or neoplastic in nature. Suspicious for neoplasm since it was present on previous chest x-ray 1 year ago, with interval more prominent appearance. No pleural effusions. No pneumothorax. Upper Abdomen: Exophytic lesion off of the midpole left kidney probably represents a cyst. No acute changes demonstrated in the visualized upper abdomen. Musculoskeletal: No chest wall abnormality. No acute or significant  osseous findings. Review of the MIP images confirms the above findings. IMPRESSION: 1. No evidence of significant pulmonary embolus. 2. Diffuse emphysematous changes in the lungs. 3. 1.6 x 1.2 cm nodule in the left inferior upper lung corresponding to chest x-ray abnormality. This is indeterminate and could represent inflammatory or neoplastic process. Tiny adjacent peribronchovascular nodules are present. Prominent mediastinal lymph nodes are nonspecific in could be metastatic or inflammatory. Consider one of the following in 3 months for both low-risk and high-risk individuals: (a) referral to chest clinic, (b) follow-up PET-CT, or (c) tissue sampling. This recommendation follows the consensus statement: Guidelines for Management of Incidental Pulmonary Nodules Detected on CT Images: From the Fleischner Society 2017; Radiology 2017; 284:228-243. 4. Diffuse peribronchial thickening with opacification or mucous plugging in the lower lobe bronchi bilaterally, suggesting acute or chronic bronchitis or inflammatory airways disease. 5. Focal consolidation in the right lower lung likely pneumonia. Emphysema (ICD10-J43.9). Electronically Signed   By: Gwyndolyn Saxon  Gerilyn Nestle M.D.   On: 01/12/2019 20:22    EKG: Independently reviewed.  Sinus tachycardia.  Not suggestive of ACS.  Assessment/Plan Principal Problem:   CAP (community acquired pneumonia) Active Problems:   HLD (hyperlipidemia)   PAD (peripheral artery disease) (HCC)   Sepsis ([place])   Acute respiratory failure with hypoxia (H[facility])   Elevated troponin   Pulmonary nodule   Sepsis and acute hypoxic respiratory failure secondary to community-acquired pneumonia Tachycardic and tachypneic.  Blood pressure intermittently soft, now improved with IV fluid.  Oxygen saturation 87% on room air.  Currently on 2 L supplemental oxygen.  Afebrile.  White count 18.5.  Lactic acid normal.  Flu panel negative.  D-dimer positive, however, CTA negative for PE.  Imaging with  evidence of right lower lobe pneumonia and bronchitis.  No wheezing appreciated on exam. -Started on vancomycin, cefepime, metronidazole, and aztreonam in the ED.  Will narrow coverage to cefepime and azithromycin.  Patient is allergic to Keflex but has been able to tolerate cefepime without any allergic reaction.  As such, will continue cefepime at this time. -IV fluid -Mucinex DM -Repeat CBC in a.m. -Blood culture x2 -Repeat CBC in a.m. -Supplemental oxygen  Troponinemia Troponin 0.04 >0.03.  EKG not suggestive of ACS.  Patient is chest pain-free.  Mild troponin elevation likely secondary to demand ischemia. -Continue to trend troponin -Echocardiogram -Cardiac monitoring  Pulmonary nodule CTA showing a 1.6 x 1.2 cm nodule in the left inferior upper lung thought to be inflammatory versus neoplastic process.  There is also evidence of prominent mediastinal lymph nodes thought to be inflammatory versus metastatic.  Patient is a former smoker with a 35+ pack year smoking history. -Patient will need outpatient follow-up with either referral to chest clinic, follow-up PET-CT, or tissue sampling.  Microscopic hematuria UA with evidence of microscopic hematuria. -Patient is a former smoker.  He will need outpatient follow-up with urology for cystoscopy.  Hyperlipidemia -Continue Lipitor  Peripheral arterial disease -Continue home cilostazol, Plavix  COPD -Stable.  No bronchospasm.  DuoNebs every 6 hours as needed.  Hypertension -Blood pressure was soft.  Hold home antihypertensives at this time.   ?Prediabetes Metformin listed in home medications.  Patient does not have diabetes listed in his history. -Check A1c -CBG checks  DVT prophylaxis: Lovenox Code Status: Patient wishes to be full code. Family Communication: No family available. Disposition Plan: Anticipate discharge in 1 to 2 days. Consults called: None Admission status: Observation, telemetry   Shela Leff  MD [facility] Pager [phone]  If 7PM-7AM, please contact night-coverage [email or web address] Password [email or web address]  01/13/2019, 2:30 AM

## 2019-01-13 NOTE — Progress Notes (Addendum)
Progress Note    Patrick [name]  DSK:[id number] DOB: August 22, 1946  DOA: 01/12/2019 PCP: Mackie Pai, PA-C    Brief Narrative:    Medical records reviewed and are as summarized below:  Patrick [name] is an 73 y.o. male with medical history significant of CAD, COPD, hypertension, hyperlipidemia presenting to the hospital complaining of cough and dyspnea on exertion.  Patient reports having a 3 to 4-day history of minimally productive cough and dyspnea on exertion.  States symptoms have been getting progressively worse.  Reports having low-grade fevers and fatigue.  Denies having any chest pain or wheezing.  Denies having any abdominal pain, nausea, vomiting, or diarrhea.  States he is a former 1 pack/day x >35-year smoker, quit in 2017.  Assessment/Plan:   Principal Problem:   CAP (community acquired pneumonia) Active Problems:   HLD (hyperlipidemia)   PAD (peripheral artery disease) (HCC)   Acute respiratory failure with hypoxia (HCC)   Elevated troponin   Pulmonary nodule  acute hypoxic respiratory failure secondary to community-acquired pneumonia - on 3 L supplemental oxygen.   -Flu panel negative -CTA negative for PE but shows evidence of right lower lobe pneumonia and bronchitis -Given vancomycin, cefepime, metronidazole, and aztreonam in the ED.   coverage narrowed to cefepime and azithromycin.  Patient is allergic to Keflex but has been able to tolerate cefepime without any allergic reaction -Mucinex  -Blood culture x2 -Repeat CBC in a.m. -wean O2 as tolerated -PRN nebs (known COPD/emphysema)  Troponinemia Troponin 0.04 >0.03.  EKG not suggestive of ACS.   -suspect mild troponin elevation likely secondary to demand ischemia. -Echocardiogram -tele  Pulmonary nodule -1.6 x 1.2 cm nodule in the left inferior upper lung thought to be inflammatory versus neoplastic process.  There is also evidence of prominent mediastinal lymph nodes thought to be inflammatory versus  metastatic.  Patient is a former smoker with a 35+ pack year smoking history. -Patient will need outpatient follow-up with either PET-CT, or tissue sampling.  Microscopic hematuria UA with evidence of microscopic hematuria. -Patient is a former smoker.  He will need outpatient follow-up   Hyperlipidemia -Continue Lipitor  Peripheral arterial disease -Continue home cilostazol, Plavix  Leukocytosis -trend  COPD -continue home meds  Hypertension -Blood pressure was soft.  Hold home antihypertensives at this time.   Diabetes -Metformin -A1C: 6.8-- consistent with priors   Malnutrition Type:  Nutrition Problem: Severe Malnutrition Etiology: chronic illness(COPD)   Malnutrition Characteristics:  Signs/Symptoms: severe fat depletion, severe muscle depletion, energy intake < 75% for > or equal to 3 months   Nutrition Interventions:  Interventions: Ensure Enlive (each supplement provides 350kcal and 20 grams of protein)    Family Communication/Anticipated D/C date and plan/Code Status   DVT prophylaxis: Lovenox ordered. Code Status: Full Code.  Family Communication:  Disposition Plan:    Medical Consultants:    None.    Subjective:   Still feels poorly  Objective:    Vitals:   01/13/19 0200 01/13/19 0300 01/13/19 0400 01/13/19 0439  BP: (!) 118/53 (!) 112/54 (!) 100/39 127/60  Pulse: 69 92 91 100  Resp: (!) 27 (!) 23 (!) 23 18  Temp:    99.1 F (37.3 C)  TempSrc:    Oral  SpO2: 93% 94% (!) 89% 94%  Weight:    57.2 kg  Height:    5\' 9"  (1.753 m)    Intake/Output Summary (Last 24 hours) at 01/13/2019 6203 Last data filed at 01/13/2019 0900 Gross per 24 hour  Intake 1236.11 ml  Output -  Net 1236.11 ml   Filed Weights   01/13/19 0439  Weight: 57.2 kg    Exam: In bed, frail appearing On 3L rrr Diminished breath sounds No LE edema  Data Reviewed:   I have personally reviewed following labs and imaging studies:  Labs: Labs  show the following:   Basic Metabolic Panel: Recent Labs  Lab 01/12/19 1649  NA 138  K 3.6  CL 99  CO2 24  GLUCOSE 192*  BUN 14  CREATININE 1.01  CALCIUM 9.5   GFR Estimated Creatinine Clearance: 53.5 mL/min (by C-G formula based on SCr of 1.01 mg/dL). Liver Function Tests: No results for input(s): AST, ALT, ALKPHOS, BILITOT, PROT, ALBUMIN in the last 168 hours. No results for input(s): LIPASE, AMYLASE in the last 168 hours. No results for input(s): AMMONIA in the last 168 hours. Coagulation profile No results for input(s): INR, PROTIME in the last 168 hours.  CBC: Recent Labs  Lab 01/12/19 1649 01/13/19 0519  WBC 18.5* 14.0*  NEUTROABS 15.9*  --   HGB 15.2 12.6*  HCT 44.7 36.7*  MCV 89.8 90.8  PLT 162 149*   Cardiac Enzymes: Recent Labs  Lab 01/12/19 1649 01/13/19 0020 01/13/19 0341  TROPONINI 0.04* 0.03* 0.05*   BNP (last 3 results) No results for input(s): PROBNP in the last 8760 hours. CBG: Recent Labs  Lab 01/13/19 0456 01/13/19 0753  GLUCAP 153* 161*   D-Dimer: Recent Labs    01/12/19 1649  DDIMER 4.08*   Hgb A1c: Recent Labs    01/13/19 0519  HGBA1C 6.8*   Lipid Profile: No results for input(s): CHOL, HDL, LDLCALC, TRIG, CHOLHDL, LDLDIRECT in the last 72 hours. Thyroid function studies: No results for input(s): TSH, T4TOTAL, T3FREE, THYROIDAB in the last 72 hours.  Invalid input(s): FREET3 Anemia work up: No results for input(s): VITAMINB12, FOLATE, FERRITIN, TIBC, IRON, RETICCTPCT in the last 72 hours. Sepsis Labs: Recent Labs  Lab 01/12/19 1649 01/12/19 1932 01/13/19 0519  WBC 18.5*  --  14.0*  LATICACIDVEN  --  1.80  --     Microbiology No results found for this or any previous visit (from the past 240 hour(s)).  Procedures and diagnostic studies:  Dg Chest 2 View  Result Date: 01/12/2019 CLINICAL DATA:  Shortness of breath productive cough for the past 4 days. History of chronic lung disease and COPD. EXAM: CHEST - 2  VIEW COMPARISON:  Earlier today. 01/30/2016. FINDINGS: Normal sized heart. Better defined area of increased density in the left mid lung zone since earlier today, not present on 01/30/2016. This has a nodular component inferiorly and appears to have a cavitary component superiorly. Together, this area measures 2.3 x 2.2 cm. The lungs remain hyperexpanded with mild diffuse peribronchial thickening and accentuation of the interstitial markings. Mild thoracic spine degenerative changes. IMPRESSION: 1. Better defined area of increased density in the left mid lung zone since earlier today, not present on 01/30/2016. This has a probable cavitary component superiorly and nodular component inferiorly. This could be infectious or neoplastic in nature. Further evaluation with a chest CT with contrast is recommended. 2. Stable changes of COPD and chronic bronchitis. Electronically Signed   By: Claudie Revering M.D.   On: 01/12/2019 18:42   Ct Angio Chest Pe W And/or Wo Contrast  Result Date: 01/12/2019 CLINICAL DATA:  Three day history of cough. Shortness of breath with exertion. Focal lesion seen in the left mid lung on prior chest radiograph. EXAM:  CT ANGIOGRAPHY CHEST WITH CONTRAST TECHNIQUE: Multidetector CT imaging of the chest was performed using the standard protocol during bolus administration of intravenous contrast. Multiplanar CT image reconstructions and MIPs were obtained to evaluate the vascular anatomy. CONTRAST:  120mL ISOVUE-370 IOPAMIDOL (ISOVUE-370) INJECTION 76% COMPARISON:  Chest radiograph 01/12/2019 FINDINGS: Cardiovascular: There is good append sick a shin of the central and segmental pulmonary arteries. No focal filling defects. No evidence of significant pulmonary embolus. Normal heart size. No pericardial effusion. Mild anterior pericardial thickening. Calcification of the aorta. Normal caliber. Great vessel origins are patent. Mediastinum/Nodes: Prominent pretracheal and subcarinal lymph nodes,  measuring up to about 1.6 cm short axis dimension. Lungs/Pleura: Diffuse emphysematous changes throughout the lungs. Diffuse peribronchial thickening with opacification or mucous plugging of some of the lower lobe bronchi bilaterally, suggesting acute or chronic bronchitis or inflammatory airways disease. There is patchy infiltration in the right lung base posteriorly which may represent focal pneumonia. In the left inferior upper lung, corresponding to the chest x-ray abnormality, there is a circumscribed nodule measuring 1.6 x 1.2 cm in diameter. There are adjacent cystic changes likely representing adjacent emphysematous change rather than cavitation of the lesion itself. Tiny adjacent peribronchovascular nodules are also present. This lesion is indeterminate and could be inflammatory or neoplastic in nature. Suspicious for neoplasm since it was present on previous chest x-ray 1 year ago, with interval more prominent appearance. No pleural effusions. No pneumothorax. Upper Abdomen: Exophytic lesion off of the midpole left kidney probably represents a cyst. No acute changes demonstrated in the visualized upper abdomen. Musculoskeletal: No chest wall abnormality. No acute or significant osseous findings. Review of the MIP images confirms the above findings. IMPRESSION: 1. No evidence of significant pulmonary embolus. 2. Diffuse emphysematous changes in the lungs. 3. 1.6 x 1.2 cm nodule in the left inferior upper lung corresponding to chest x-ray abnormality. This is indeterminate and could represent inflammatory or neoplastic process. Tiny adjacent peribronchovascular nodules are present. Prominent mediastinal lymph nodes are nonspecific in could be metastatic or inflammatory. Consider one of the following in 3 months for both low-risk and high-risk individuals: (a) referral to chest clinic, (b) follow-up PET-CT, or (c) tissue sampling. This recommendation follows the consensus statement: Guidelines for Management  of Incidental Pulmonary Nodules Detected on CT Images: From the Fleischner Society 2017; Radiology 2017; 284:228-243. 4. Diffuse peribronchial thickening with opacification or mucous plugging in the lower lobe bronchi bilaterally, suggesting acute or chronic bronchitis or inflammatory airways disease. 5. Focal consolidation in the right lower lung likely pneumonia. Emphysema (ICD10-J43.9). Electronically Signed   By: Lucienne Capers M.D.   On: 01/12/2019 20:22    Medications:   . atorvastatin  40 mg Oral Daily  . cilostazol  50 mg Oral BID  . clopidogrel  75 mg Oral Daily  . dextromethorphan-guaiFENesin  2 tablet Oral BID  . enoxaparin (LOVENOX) injection  40 mg Subcutaneous Q24H  . ipratropium-albuterol  3 mL Nebulization Q6H   Continuous Infusions: . sodium chloride Stopped (01/13/19 0618)  . azithromycin    . ceFEPime (MAXIPIME) IV Stopped (01/13/19 3790)     LOS: 0 days   Geradine Girt  [facility]   *Please refer to [email or web address], password [email or web address] to get updated schedule on who will round on this patient, as hospitalists switch teams weekly. If 7PM-7AM, please contact night-coverage at [email or web address], password [email or web address] for any overnight needs.  01/13/2019, 9:37 AM

## 2019-01-13 NOTE — Plan of Care (Signed)
  Problem: Clinical Measurements: Goal: Respiratory complications will improve Outcome: Progressing Goal: Cardiovascular complication will be avoided Outcome: Progressing   Problem: Activity: Goal: Risk for activity intolerance will decrease Outcome: Progressing   Problem: Nutrition: Goal: Adequate nutrition will be maintained Outcome: Progressing   Problem: Coping: Goal: Level of anxiety will decrease Outcome: Progressing   

## 2019-01-13 NOTE — Progress Notes (Signed)
Initial Nutrition Assessment  DOCUMENTATION CODES:   Severe malnutrition in context of chronic illness  INTERVENTION:  Ensure Enlive po BID, each supplement provides 350 kcal and 20 grams of protein  NUTRITION DIAGNOSIS:   Severe Malnutrition related to chronic illness(COPD) as evidenced by severe fat depletion, severe muscle depletion, energy intake < 75% for > or equal to 3 months.  GOAL:   Patient will meet greater than or equal to 90% of their needs  MONITOR:   PO intake, Weight trends, Supplement acceptance, Labs, I & O's, Skin  REASON FOR ASSESSMENT:   Malnutrition Screening Tool   ASSESSMENT:   Pt with PMH of CAD, COPD, HLD, HTN. Admitted to [facility] from PCP on 1/14 d/t experiencing SOB and cough for last 3 days. Pt currently has PNA.   Pt reports that his appetite has slowly became worse over the years, which he said has caused a gradual decrease in wt. Normal intake is 2 meals per day, and snacks throughout the day. First meal is around 10-11am which consists of toast with butter, coffee, and occasionally fried eggs. Next meal/dinner is ~5pm and is often frozen meals such as [employer] Spaghetti or chicken fettuccini. Other dinners may include soup such as [place] Chunky that has meat and vegetables. He said he will often have coffee or milk(2%) with dinner, and occasionally some vegetables. He enjoys eating fruits but does not eat them often. He said that he often snacks on Lays Chips throughout the day.  He lives with a roommate who does cook meals occasionally; these meals can include meats with two vegetables. Pt reports that his taste has changed over time and thinks maybe that is why he has less of an appetite. He reports no nausea or vomiting, and has no problems chewing.   Pt reports that he has been experiencing SOB since 2017, which he accounts to PNA, and has not weighed his UBW (150#) since 2015. The SOB has caused limitations on his mobility. Spoke with pt about  including Ensure between meals to increase protein and energy intake d/t malnutrition and increased needs.    Medications reviewed and include: Lipitor, insulin aspart 0-5 units daily at bedtime, insulin aspart 0-9 units TID w/ meals  Labs reviewed CBG (last 3)  Recent Labs    01/13/19 0456 01/13/19 0753 01/13/19 1124  GLUCAP 153* 161* 151*    NUTRITION - FOCUSED PHYSICAL EXAM:    Most Recent Value  Orbital Region  Severe depletion  Upper Arm Region  Severe depletion  Thoracic and Lumbar Region  Moderate depletion  Buccal Region  Moderate depletion  Temple Region  Moderate depletion  Clavicle Bone Region  Severe depletion  Clavicle and Acromion Bone Region  Severe depletion  Scapular Bone Region  Severe depletion  Dorsal Hand  Moderate depletion  Patellar Region  Moderate depletion  Anterior Thigh Region  Moderate depletion  Posterior Calf Region  Moderate depletion  Edema (RD Assessment)  None  Hair  Reviewed  Eyes  Reviewed  Mouth  Reviewed  Skin  Reviewed  Nails  Reviewed       Diet Order:   Diet Order            Diet Carb Modified Fluid consistency: Thin; Room service appropriate? Yes  Diet effective now              EDUCATION NEEDS:   Education needs have been addressed  Skin:  Skin Assessment: Reviewed RN Assessment  Last BM:  Unknown  Height:   Ht Readings from Last 1 Encounters:  01/13/19 5\' 9"  (1.753 m)    Weight:   Wt Readings from Last 1 Encounters:  01/13/19 57.2 kg    Ideal Body Weight:  72.72 kg  BMI:  Body mass index is 18.64 kg/m.  Estimated Nutritional Needs:   Kcal:  1700-2000 kcal  Protein:  90-100g  Fluid:  1.7-2L     [place] Intern

## 2019-01-14 DIAGNOSIS — J9601 Acute respiratory failure with hypoxia: Secondary | ICD-10-CM | POA: Diagnosis present

## 2019-01-14 DIAGNOSIS — I248 Other forms of acute ischemic heart disease: Secondary | ICD-10-CM | POA: Diagnosis present

## 2019-01-14 DIAGNOSIS — Z881 Allergy status to other antibiotic agents status: Secondary | ICD-10-CM | POA: Diagnosis not present

## 2019-01-14 DIAGNOSIS — A419 Sepsis, unspecified organism: Secondary | ICD-10-CM | POA: Diagnosis present

## 2019-01-14 DIAGNOSIS — R05 Cough: Secondary | ICD-10-CM | POA: Diagnosis present

## 2019-01-14 DIAGNOSIS — Z23 Encounter for immunization: Secondary | ICD-10-CM | POA: Diagnosis present

## 2019-01-14 DIAGNOSIS — R7989 Other specified abnormal findings of blood chemistry: Secondary | ICD-10-CM | POA: Diagnosis not present

## 2019-01-14 DIAGNOSIS — R3129 Other microscopic hematuria: Secondary | ICD-10-CM | POA: Diagnosis present

## 2019-01-14 DIAGNOSIS — Z79899 Other long term (current) drug therapy: Secondary | ICD-10-CM | POA: Diagnosis not present

## 2019-01-14 DIAGNOSIS — Z87891 Personal history of nicotine dependence: Secondary | ICD-10-CM | POA: Diagnosis not present

## 2019-01-14 DIAGNOSIS — J440 Chronic obstructive pulmonary disease with acute lower respiratory infection: Secondary | ICD-10-CM | POA: Diagnosis present

## 2019-01-14 DIAGNOSIS — E1151 Type 2 diabetes mellitus with diabetic peripheral angiopathy without gangrene: Secondary | ICD-10-CM | POA: Diagnosis present

## 2019-01-14 DIAGNOSIS — J181 Lobar pneumonia, unspecified organism: Secondary | ICD-10-CM | POA: Diagnosis not present

## 2019-01-14 DIAGNOSIS — Z681 Body mass index (BMI) 19 or less, adult: Secondary | ICD-10-CM | POA: Diagnosis not present

## 2019-01-14 DIAGNOSIS — I10 Essential (primary) hypertension: Secondary | ICD-10-CM | POA: Diagnosis present

## 2019-01-14 DIAGNOSIS — E43 Unspecified severe protein-calorie malnutrition: Secondary | ICD-10-CM | POA: Diagnosis present

## 2019-01-14 DIAGNOSIS — Z7902 Long term (current) use of antithrombotics/antiplatelets: Secondary | ICD-10-CM | POA: Diagnosis not present

## 2019-01-14 DIAGNOSIS — E876 Hypokalemia: Secondary | ICD-10-CM | POA: Diagnosis present

## 2019-01-14 DIAGNOSIS — E785 Hyperlipidemia, unspecified: Secondary | ICD-10-CM | POA: Diagnosis present

## 2019-01-14 DIAGNOSIS — Z7984 Long term (current) use of oral hypoglycemic drugs: Secondary | ICD-10-CM | POA: Diagnosis not present

## 2019-01-14 DIAGNOSIS — Z833 Family history of diabetes mellitus: Secondary | ICD-10-CM | POA: Diagnosis not present

## 2019-01-14 DIAGNOSIS — J189 Pneumonia, unspecified organism: Secondary | ICD-10-CM | POA: Diagnosis present

## 2019-01-14 DIAGNOSIS — R911 Solitary pulmonary nodule: Secondary | ICD-10-CM | POA: Diagnosis present

## 2019-01-14 DIAGNOSIS — I2510 Atherosclerotic heart disease of native coronary artery without angina pectoris: Secondary | ICD-10-CM | POA: Diagnosis present

## 2019-01-14 LAB — BASIC METABOLIC PANEL
Anion gap: 9 (ref 5–15)
BUN: 13 mg/dL (ref 8–23)
CO2: 24 mmol/L (ref 22–32)
Calcium: 8.2 mg/dL — ABNORMAL LOW (ref 8.9–10.3)
Chloride: 107 mmol/L (ref 98–111)
Creatinine, Ser: 0.92 mg/dL (ref 0.61–1.24)
GFR calc Af Amer: >60 mL/min (ref >60)
GFR calc non Af Amer: >60 mL/min (ref >60)
Glucose, Bld: 130 mg/dL — ABNORMAL HIGH (ref 70–99)
POTASSIUM: 3 mmol/L — AB (ref 3.5–5.1)
Sodium: 140 mmol/L (ref 135–145)

## 2019-01-14 LAB — GLUCOSE, CAPILLARY
GLUCOSE-CAPILLARY: 128 mg/dL — AB (ref 70–99)
GLUCOSE-CAPILLARY: 162 mg/dL — AB (ref 70–99)
Glucose-Capillary: 183 mg/dL — ABNORMAL HIGH (ref 70–99)
Glucose-Capillary: 194 mg/dL — ABNORMAL HIGH (ref 70–99)

## 2019-01-14 LAB — CBC
HCT: 33.5 % — ABNORMAL LOW (ref 39.0–52.0)
Hemoglobin: 11.3 g/dL — ABNORMAL LOW (ref 13.0–17.0)
MCH: 30.5 pg (ref 26.0–34.0)
MCHC: 33.7 g/dL (ref 30.0–36.0)
MCV: 90.3 fL (ref 80.0–100.0)
Platelets: 147 10*3/uL — ABNORMAL LOW (ref 150–400)
RBC: 3.71 MIL/uL — ABNORMAL LOW (ref 4.22–5.81)
RDW: 12.3 % (ref 11.5–15.5)
WBC: 12.2 10*3/uL — AB (ref 4.0–10.5)
nRBC: 0 % (ref 0.0–0.2)

## 2019-01-14 LAB — STREP PNEUMONIAE URINARY ANTIGEN: STREP PNEUMO URINARY ANTIGEN: NEGATIVE

## 2019-01-14 LAB — MAGNESIUM: Magnesium: 1.8 mg/dL (ref 1.7–2.4)

## 2019-01-14 MED ORDER — POTASSIUM CHLORIDE CRYS ER 20 MEQ PO TBCR
40.0000 meq | EXTENDED_RELEASE_TABLET | Freq: Once | ORAL | Status: AC
  Administered 2019-01-14: 40 meq via ORAL
  Filled 2019-01-14: qty 4

## 2019-01-14 MED ORDER — INSULIN ASPART 100 UNIT/ML ~~LOC~~ SOLN
0.0000 [IU] | Freq: Three times a day (TID) | SUBCUTANEOUS | Status: DC
  Administered 2019-01-15: 1 [IU] via SUBCUTANEOUS
  Administered 2019-01-16 – 2019-01-18 (×7): 2 [IU] via SUBCUTANEOUS

## 2019-01-14 MED ORDER — POTASSIUM CHLORIDE CRYS ER 20 MEQ PO TBCR
40.0000 meq | EXTENDED_RELEASE_TABLET | ORAL | Status: AC
  Administered 2019-01-14 (×2): 40 meq via ORAL
  Filled 2019-01-14: qty 2

## 2019-01-14 NOTE — Progress Notes (Signed)
   01/14/19 1205  Mobility  Activity Ambulated in hall;Dangled on edge of bed  Range of Motion Active;All extremities  Level of Assistance Independent after set-up  Assistive Device Other (Comment) (Dinamap)  Minutes Stood 5 minutes  Minutes Ambulated 5 minutes  Distance Ambulated (ft) 480 ft  Mobility Response Tolerated well  SATURATION QUALIFICATIONS: (This note is used to comply with regulatory documentation for home oxygen)  Patient Saturations on Room Air at Rest = 92%  Patient Saturations on Room Air while Ambulating = 88%  Patient Saturations on *2 Liters of oxygen while Ambulating = 91%

## 2019-01-14 NOTE — Plan of Care (Signed)
  Problem: Clinical Measurements: Goal: Diagnostic test results will improve Outcome: Progressing Goal: Respiratory complications will improve Outcome: Progressing Goal: Cardiovascular complication will be avoided Outcome: Progressing   Problem: Activity: Goal: Risk for activity intolerance will decrease Outcome: Progressing   Problem: Nutrition: Goal: Adequate nutrition will be maintained Outcome: Progressing   Problem: Coping: Goal: Level of anxiety will decrease Outcome: Progressing   Problem: Elimination: Goal: Will not experience complications related to urinary retention Outcome: Progressing   Problem: Pain Managment: Goal: General experience of comfort will improve Outcome: Progressing   Problem: Safety: Goal: Ability to remain free from injury will improve Outcome: Progressing   

## 2019-01-14 NOTE — Progress Notes (Signed)
Progress Note    Patrick [name]  LNL:[id number] DOB: [birth year]  DOA: 01/12/2019 PCP: Mackie Pai, PA-C    Brief Narrative:    Medical records reviewed and are as summarized below:  Patrick [name] is an 73 y.o. male with medical history significant of CAD, COPD, hypertension, hyperlipidemia presenting to the hospital complaining of cough and dyspnea on exertion.  Patient reports having a 3 to 4-day history of minimally productive cough and dyspnea on exertion.  States symptoms have been getting progressively worse.  Reports having low-grade fevers and fatigue.  Denies having any chest pain or wheezing.  Denies having any abdominal pain, nausea, vomiting, or diarrhea.  States he is a former 1 pack/day x >35-year smoker, quit in 2017.  Assessment/Plan:   Principal Problem:   CAP (community acquired pneumonia) Active Problems:   HLD (hyperlipidemia)   PAD (peripheral artery disease) (HCC)   Acute respiratory failure with hypoxia (HCC)   Elevated troponin   Pulmonary nodule  acute hypoxic respiratory failure secondary to community-acquired pneumonia with continued fever -was on 3 L supplemental oxygen- has been weaned down to 2L today -Flu panel negative -CTA negative for PE but shows evidence of right lower lobe pneumonia and bronchitis -Given vancomycin, cefepime, metronidazole, and aztreonam in the ED and then coverage narrowed to cefepime and azithromycin. -Mucinex  -pulmonary toilet -strep pneumo pending -Blood culture x2 -PRN nebs (known COPD/emphysema) -continue to await home O2 study -tylenol ordered but not given  Troponinemia Troponin 0.04 >0.03.  EKG not suggestive of ACS.   -suspect mild troponin elevation likely secondary to demand ischemia. -tele  Pulmonary nodule -1.6 x 1.2 cm nodule in the left inferior upper lung thought to be inflammatory versus neoplastic process.  There is also evidence of prominent mediastinal lymph nodes thought to be inflammatory  versus metastatic.  Patient is a former smoker with a 35+ pack year smoking history. -Patient will need outpatient follow-up with either PET-CT, or tissue sampling.  Microscopic hematuria UA with evidence of microscopic hematuria. -Patient is a former smoker.  He will need outpatient follow-up   Hyperlipidemia -Continue Lipitor  Peripheral arterial disease -Continue home cilostazol, Plavix  Leukocytosis -trend  COPD -continue home meds  Hypertension -Blood pressure was soft.  Hold home antihypertensives at this time.   Hypokalemia -replete  Diabetes -Metformin -SSI here -A1C: 6.8-- consistent with priors   Malnutrition Type:  Nutrition Problem: Severe Malnutrition Etiology: chronic illness(COPD)   Malnutrition Characteristics:  Signs/Symptoms: severe fat depletion, severe muscle depletion, energy intake < 75% for > or equal to 3 months   Nutrition Interventions:  Interventions: Ensure Enlive (each supplement provides 350kcal and 20 grams of protein)    Family Communication/Anticipated D/C date and plan/Code Status   DVT prophylaxis: Lovenox ordered. Code Status: Full Code.  Family Communication:  Disposition Plan: pending ability to wean O2 and to remain fever free for 24 hours-- may need further broadening of abx if continues to spike temperature   Medical Consultants:    None.    Subjective:   Appetite better today  Coughing but not able to bring up any sputum  Objective:    Vitals:   01/13/19 1940 01/14/19 0019 01/14/19 0106 01/14/19 0453  BP:  (!) 98/56  (!) 112/52  Pulse: 93 89 91 95  Resp: 20 20 20 18   Temp:  100.3 F (37.9 C)  100 F (37.8 C)  TempSrc:  Oral  Oral  SpO2: 95% 98% 94% 94%  Weight:  58.6 kg  Height:        Intake/Output Summary (Last 24 hours) at 01/14/2019 1114 Last data filed at 01/14/2019 0457 Gross per 24 hour  Intake 721.26 ml  Output 501 ml  Net 220.26 ml   Filed Weights   01/13/19 0439  01/14/19 0453  Weight: 57.2 kg 58.6 kg    Exam: In bed, frail appearing On 3L rrr Diminished breath sounds No LE edema  Data Reviewed:   I have personally reviewed following labs and imaging studies:  Labs: Labs show the following:   Basic Metabolic Panel: Recent Labs  Lab 01/12/19 1649 01/14/19 0609  NA 138 140  K 3.6 3.0*  CL 99 107  CO2 24 24  GLUCOSE 192* 130*  BUN 14 13  CREATININE 1.01 0.92  CALCIUM 9.5 8.2*   GFR Estimated Creatinine Clearance: 60.2 mL/min (by C-G formula based on SCr of 0.92 mg/dL). Liver Function Tests: No results for input(s): AST, ALT, ALKPHOS, BILITOT, PROT, ALBUMIN in the last 168 hours. No results for input(s): LIPASE, AMYLASE in the last 168 hours. No results for input(s): AMMONIA in the last 168 hours. Coagulation profile No results for input(s): INR, PROTIME in the last 168 hours.  CBC: Recent Labs  Lab 01/12/19 1649 01/13/19 0519 01/14/19 0609  WBC 18.5* 14.0* 12.2*  NEUTROABS 15.9*  --   --   HGB 15.2 12.6* 11.3*  HCT 44.7 36.7* 33.5*  MCV 89.8 90.8 90.3  PLT 162 149* 147*   Cardiac Enzymes: Recent Labs  Lab 01/12/19 1649 01/13/19 0020 01/13/19 0341 01/13/19 0908  TROPONINI 0.04* 0.03* 0.05* <0.03   BNP (last 3 results) No results for input(s): PROBNP in the last 8760 hours. CBG: Recent Labs  Lab 01/13/19 0753 01/13/19 1124 01/13/19 1637 01/13/19 2116 01/14/19 0740  GLUCAP 161* 151* 171* 194* 128*   D-Dimer: Recent Labs    01/12/19 1649  DDIMER 4.08*   Hgb A1c: Recent Labs    01/13/19 0519  HGBA1C 6.8*   Lipid Profile: No results for input(s): CHOL, HDL, LDLCALC, TRIG, CHOLHDL, LDLDIRECT in the last 72 hours. Thyroid function studies: No results for input(s): TSH, T4TOTAL, T3FREE, THYROIDAB in the last 72 hours.  Invalid input(s): FREET3 Anemia work up: No results for input(s): VITAMINB12, FOLATE, FERRITIN, TIBC, IRON, RETICCTPCT in the last 72 hours. Sepsis Labs: Recent Labs  Lab  01/12/19 1649 01/12/19 1932 01/13/19 0519 01/14/19 0609  WBC 18.5*  --  14.0* 12.2*  LATICACIDVEN  --  1.80  --   --     Microbiology Recent Results (from the past 240 hour(s))  Blood Culture (routine x 2)     Status: None (Preliminary result)   Collection Time: 01/12/19  7:00 PM  Result Value Ref Range Status   Specimen Description BLOOD LEFT FOREARM  Final   Special Requests   Final    BOTTLES DRAWN AEROBIC AND ANAEROBIC Blood Culture adequate volume   Culture   Final    NO GROWTH 2 DAYS Performed at [facility], [place] [address], [place], [place] [address]    Report Status PENDING  Incomplete  Blood Culture (routine x 2)     Status: None (Preliminary result)   Collection Time: 01/12/19  7:15 PM  Result Value Ref Range Status   Specimen Description BLOOD LEFT ANTECUBITAL  Final   Special Requests   Final    BOTTLES DRAWN AEROBIC AND ANAEROBIC Blood Culture adequate volume   Culture   Final  NO GROWTH 2 DAYS Performed at [facility], [place] [address] [place], [place] [address]    Report Status PENDING  Incomplete    Procedures and diagnostic studies:  Dg Chest 2 View  Result Date: 01/12/2019 CLINICAL DATA:  Shortness of breath productive cough for the past 4 days. History of chronic lung disease and COPD. EXAM: CHEST - 2 VIEW COMPARISON:  Earlier today. 01/30/2016. FINDINGS: Normal sized heart. Better defined area of increased density in the left mid lung zone since earlier today, not present on 01/30/2016. This has a nodular component inferiorly and appears to have a cavitary component superiorly. Together, this area measures 2.3 x 2.2 cm. The lungs remain hyperexpanded with mild diffuse peribronchial thickening and accentuation of the interstitial markings. Mild thoracic spine degenerative changes. IMPRESSION: 1. Better defined area of increased density in the left mid lung zone since earlier today, not present on 01/30/2016. This has a probable cavitary  component superiorly and nodular component inferiorly. This could be infectious or neoplastic in nature. Further evaluation with a chest CT with contrast is recommended. 2. Stable changes of COPD and chronic bronchitis. Electronically Signed   By: Claudie Revering M.D.   On: 01/12/2019 18:42   Ct Angio Chest Pe W And/or Wo Contrast  Result Date: 01/12/2019 CLINICAL DATA:  Three day history of cough. Shortness of breath with exertion. Focal lesion seen in the left mid lung on prior chest radiograph. EXAM: CT ANGIOGRAPHY CHEST WITH CONTRAST TECHNIQUE: Multidetector CT imaging of the chest was performed using the standard protocol during bolus administration of intravenous contrast. Multiplanar CT image reconstructions and MIPs were obtained to evaluate the vascular anatomy. CONTRAST:  134mL ISOVUE-370 IOPAMIDOL (ISOVUE-370) INJECTION 76% COMPARISON:  Chest radiograph 01/12/2019 FINDINGS: Cardiovascular: There is good append sick a shin of the central and segmental pulmonary arteries. No focal filling defects. No evidence of significant pulmonary embolus. Normal heart size. No pericardial effusion. Mild anterior pericardial thickening. Calcification of the aorta. Normal caliber. Great vessel origins are patent. Mediastinum/Nodes: Prominent pretracheal and subcarinal lymph nodes, measuring up to about 1.6 cm short axis dimension. Lungs/Pleura: Diffuse emphysematous changes throughout the lungs. Diffuse peribronchial thickening with opacification or mucous plugging of some of the lower lobe bronchi bilaterally, suggesting acute or chronic bronchitis or inflammatory airways disease. There is patchy infiltration in the right lung base posteriorly which may represent focal pneumonia. In the left inferior upper lung, corresponding to the chest x-ray abnormality, there is a circumscribed nodule measuring 1.6 x 1.2 cm in diameter. There are adjacent cystic changes likely representing adjacent emphysematous change rather than  cavitation of the lesion itself. Tiny adjacent peribronchovascular nodules are also present. This lesion is indeterminate and could be inflammatory or neoplastic in nature. Suspicious for neoplasm since it was present on previous chest x-ray 1 year ago, with interval more prominent appearance. No pleural effusions. No pneumothorax. Upper Abdomen: Exophytic lesion off of the midpole left kidney probably represents a cyst. No acute changes demonstrated in the visualized upper abdomen. Musculoskeletal: No chest wall abnormality. No acute or significant osseous findings. Review of the MIP images confirms the above findings. IMPRESSION: 1. No evidence of significant pulmonary embolus. 2. Diffuse emphysematous changes in the lungs. 3. 1.6 x 1.2 cm nodule in the left inferior upper lung corresponding to chest x-ray abnormality. This is indeterminate and could represent inflammatory or neoplastic process. Tiny adjacent peribronchovascular nodules are present. Prominent mediastinal lymph nodes are nonspecific in could be metastatic or inflammatory. Consider one of  the following in 3 months for both low-risk and high-risk individuals: (a) referral to chest clinic, (b) follow-up PET-CT, or (c) tissue sampling. This recommendation follows the consensus statement: Guidelines for Management of Incidental Pulmonary Nodules Detected on CT Images: From the Fleischner Society 2017; Radiology 2017; 284:228-243. 4. Diffuse peribronchial thickening with opacification or mucous plugging in the lower lobe bronchi bilaterally, suggesting acute or chronic bronchitis or inflammatory airways disease. 5. Focal consolidation in the right lower lung likely pneumonia. Emphysema (ICD10-J43.9). Electronically Signed   By: Lucienne Capers M.D.   On: 01/12/2019 20:22    Medications:   . atorvastatin  40 mg Oral Daily  . cilostazol  50 mg Oral BID  . clopidogrel  75 mg Oral Daily  . dextromethorphan-guaiFENesin  2 tablet Oral BID  .  enoxaparin (LOVENOX) injection  40 mg Subcutaneous Q24H  . feeding supplement (ENSURE ENLIVE)  237 mL Oral BID BM  . insulin aspart  0-5 Units Subcutaneous QHS  . insulin aspart  0-9 Units Subcutaneous TID WC  . ipratropium-albuterol  3 mL Nebulization Q6H   Continuous Infusions: . sodium chloride 1,000 mL (01/14/19 0545)  . azithromycin Stopped (01/13/19 2105)  . ceFEPime (MAXIPIME) IV 1 g (01/14/19 0546)     LOS: 0 days   Geradine Girt  [facility]   *Please refer to [email or web address], password [email or web address] to get updated schedule on who will round on this patient, as hospitalists switch teams weekly. If 7PM-7AM, please contact night-coverage at [email or web address], password [email or web address] for any overnight needs.  01/14/2019, 11:14 AM

## 2019-01-15 LAB — GLUCOSE, CAPILLARY
GLUCOSE-CAPILLARY: 152 mg/dL — AB (ref 70–99)
GLUCOSE-CAPILLARY: 210 mg/dL — AB (ref 70–99)
Glucose-Capillary: 139 mg/dL — ABNORMAL HIGH (ref 70–99)
Glucose-Capillary: 201 mg/dL — ABNORMAL HIGH (ref 70–99)

## 2019-01-15 MED ORDER — INFLUENZA VAC SPLIT HIGH-DOSE 0.5 ML IM SUSY
0.5000 mL | PREFILLED_SYRINGE | INTRAMUSCULAR | Status: AC
  Administered 2019-01-17: 0.5 mL via INTRAMUSCULAR
  Filled 2019-01-15: qty 0.5

## 2019-01-15 NOTE — Progress Notes (Signed)
Physical Therapy Evaluation/ Discharge Patient Details Name: Patrick [name] MRN: [id number] DOB: 1946/11/23 Today's Date: 01/15/2019   History of Present Illness  Patient is 73 y/o male admitted to hospital following c/o cough and DOE secondary to acute respiratory failure from CAP. CT was negative for PE. PMH includes HTN, HLD, PAD, diabetes, and COPD.   Clinical Impression  Patient admitted to hospital with above problems and below deficits. Patient ambulated and performed stair navigation with supervision to min guard for safety. No LOB noted during dynamic gait tasks. Oxygen levels remained >88% on 3L during ambulation and increased >90% following pursed lip breathing technique. Educated about seated rest breaks following d/c home if feeling SOB. No further acute PT needs identified. All education has been completed and the patient has no further questions. See below for any follow-up Physical Therapy or equipment needs. PT is signing off. Thank you for this referral. If needs change, please re consult.      Follow Up Recommendations No PT follow up    Equipment Recommendations  None recommended by PT    Recommendations for Other Services       Precautions / Restrictions Precautions Precautions: None Restrictions Weight Bearing Restrictions: No      Mobility  Bed Mobility Overal bed mobility: Independent       Supine to sit: Independent     General bed mobility comments: Patient is independent with all bed mobility   Transfers Overall transfer level: Needs assistance Equipment used: None Transfers: Sit to/from Stand Sit to Stand: Supervision         General transfer comment: required supervision for safety  Ambulation/Gait Ambulation/Gait assistance: Supervision;Min guard Gait Distance (Feet): 400 Feet Assistive device: None Gait Pattern/deviations: Step-through pattern Gait velocity: WFL   General Gait Details: Ambulated with step through gait pattern and  WFL gait speed. Min guard during dynamic gait tasks for safety. No LOB noted. Oxygen saturations remained 88% or higher on 3L and greater than 90% on 3L folllowing pursed lip breathing technique. Educated about seated rest breaks following d/c if experiencing SOB  Stairs Stairs: Yes Stairs assistance: Supervision;Min guard Stair Management: One rail Left;Alternating pattern Number of Stairs: 3 General stair comments: required supervision to min guard for safety. No LOB. Reciporcal gait pattern with L rail during stair navigation.  Wheelchair Mobility    Modified Rankin (Stroke Patients Only)       Balance Overall balance assessment: Needs assistance Sitting-balance support: No upper extremity supported;Feet unsupported Sitting balance-Leahy Scale: Good Sitting balance - Comments: able to put on shoes with no LOB   Standing balance support: No upper extremity supported Standing balance-Leahy Scale: Good Standing balance comment: No LOB or UE support required while standing/ambulating                 Standardized Balance Assessment Standardized Balance Assessment : Dynamic Gait Index   Dynamic Gait Index Level Surface: Normal Gait with Horizontal Head Turns: Mild Impairment Step Over Obstacle: Normal Step Around Obstacles: Normal Steps: Mild Impairment       Pertinent Vitals/Pain Pain Assessment: No/denies pain    Home Living Family/patient expects to be discharged to:: Private residence Living Arrangements: Other (Comment)(lives with house mate) Available Help at Discharge: Family;Friend(s);Available 24 hours/day Type of Home: House Home Access: Stairs to enter Entrance Stairs-Rails: Left Entrance Stairs-Number of Steps: 3 Home Layout: One level Home Equipment: None      Prior Function Level of Independence: Independent  Hand Dominance        Extremity/Trunk Assessment   Upper Extremity Assessment Upper Extremity Assessment:  Overall WFL for tasks assessed    Lower Extremity Assessment Lower Extremity Assessment: Generalized weakness    Cervical / Trunk Assessment Cervical / Trunk Assessment: Normal  Communication   Communication: No difficulties  Cognition Arousal/Alertness: Awake/alert Behavior During Therapy: WFL for tasks assessed/performed Overall Cognitive Status: Within Functional Limits for tasks assessed                                        General Comments General comments (skin integrity, edema, etc.): HR increased to 120s during gait.     Exercises     Assessment/Plan    PT Assessment Patent does not need any further PT services  PT Problem List         PT Treatment Interventions      PT Goals (Current goals can be found in the Care Plan section)  Acute Rehab PT Goals Patient Stated Goal: to breathe better PT Goal Formulation: With patient Time For Goal Achievement: 01/15/19 Potential to Achieve Goals: Good    Frequency     Barriers to discharge        Co-evaluation               AM-PAC PT "6 Clicks" Mobility  Outcome Measure Help needed turning from your back to your side while in a flat bed without using bedrails?: None Help needed moving from lying on your back to sitting on the side of a flat bed without using bedrails?: None Help needed moving to and from a bed to a chair (including a wheelchair)?: None Help needed standing up from a chair using your arms (e.g., wheelchair or bedside chair)?: None Help needed to walk in hospital room?: None Help needed climbing 3-5 steps with a railing? : A Little 6 Click Score: 23    End of Session Equipment Utilized During Treatment: Gait belt Activity Tolerance: Patient tolerated treatment well Patient left: in bed;with call bell/phone within reach(sitting EOB) Nurse Communication: Mobility status PT Visit Diagnosis: Muscle weakness (generalized) (M62.81);Other abnormalities of gait and mobility  (R26.89)    Time: 6979-4801 PT Time Calculation (min) (ACUTE ONLY): 23 min   Charges:   PT Evaluation $PT Eval Low Complexity: 1 Low PT Treatments $Gait Training: 8-22 mins        Erick Blinks, SPT  Erick Blinks 01/15/2019, 5:00 PM

## 2019-01-15 NOTE — Progress Notes (Signed)
RT instructed pt on the use of flutter valve. Pt able to demonstrate back good technique with a productive cough.

## 2019-01-15 NOTE — Progress Notes (Signed)
Pharmacy Antibiotic Note  Patrick [name] is a 73 y.o. male on day # 4 Cefepime and Azithromycin for RLL pneumonia and bronchitis. Hx intolerant to Cephalexin but tolerating Cefepime.  Renal function stable.   Plan:  Continue Cefepime 1gm IV q8hrs.  Also on Azithromycin 500 mg IV q24hrs.  No dosage adjustments anticipated.  Will follow renal function, culture data, clinical progress and length of therapy.  Height: 5\' 9"  (175.3 cm) Weight: 131 lb (59.4 kg)(scale a) IBW/kg (Calculated) : 70.7  Temp (24hrs), Avg:99.1 F (37.3 C), Min:98.1 F (36.7 C), Max:99.8 F (37.7 C)  Recent Labs  Lab 01/12/19 1649 01/12/19 1932 01/13/19 0519 01/14/19 0609  WBC 18.5*  --  14.0* 12.2*  CREATININE 1.01  --   --  0.92  LATICACIDVEN  --  1.80  --   --     Estimated Creatinine Clearance: 61 mL/min (by C-G formula based on SCr of 0.92 mg/dL).    Allergies  Allergen Reactions  . Aspirin Swelling  . Cephalexin Itching    Tolerated Cefepime 12/2018  . Ibuprofen Swelling    Antimicrobials this admission:  Vancomycin x 1 on 1/14  Metronidazole x 1 on 1/14  Cefepime 1/14 >>  Azithromycin 1/14 >>  Aztreonam x 1 ordered on 1/14 but cancelled before dose given  Dose adjustments this admission:  n/a  Microbiology results:  1/14 Blood x 2 - no growth x 3 days to date  1/14 Influenza PCR - negative  1/16 Strep pneumo Ag negative   Thank you for allowing pharmacy to be a part of this patient's care.  Arty Baumgartner, [place] Pager: [phone] or phone: [phone] 01/15/2019 12:37 PM

## 2019-01-15 NOTE — Progress Notes (Addendum)
Progress Note    Patrick [name]  OFB:[id number] DOB: [birth year]  DOA: 01/12/2019 PCP: Mackie Pai, PA-C    Brief Narrative:    Medical records reviewed and are as summarized below:  Patrick [name] is an 73 y.o. male with medical history significant of CAD, COPD, hypertension, hyperlipidemia presenting to the hospital complaining of cough and dyspnea on exertion.  Patient reports having a 3 to 4-day history of minimally productive cough and dyspnea on exertion.  States symptoms have been getting progressively worse.  Reports having low-grade fevers and fatigue.  Denies having any chest pain or wheezing.  Denies having any abdominal pain, nausea, vomiting, or diarrhea.  States he is a former 1 pack/day x >35-year smoker, quit in 2017.  Subjective:   He still feeling bad, still reports some dyspnea, cough, minimally productive, but still feeling congested  Assessment/Plan:   Principal Problem:   CAP (community acquired pneumonia) Active Problems:   HLD (hyperlipidemia)   PAD (peripheral artery disease) (HCC)   Acute respiratory failure with hypoxia (HCC)   Elevated troponin   Pulmonary nodule  Acute hypoxic respiratory failure secondary to community-acquired pneumonia -Remains with subjective dyspnea today, as well remains on 3 L nasal cannula -Influenza panel is negative, pneumonia antigen is negative as well, negative as well -CTA negative for PE but shows evidence of right lower lobe pneumonia and bronchitis -Given vancomycin, cefepime, metronidazole, and aztreonam in the ED.   coverage narrowed to cefepime and azithromycin.  Patient is allergic to Keflex but has been able to tolerate cefepime without any allergic reaction -Encouraged to use flutter valve, incentive spirometry, he is on Mucinex  Troponinemia Troponin 0.04 >0.03.  EKG not suggestive of ACS.   -suspect mild troponin elevation likely secondary to demand ischemia. -Low on 2D echo  Pulmonary nodule -1.6 x  1.2 cm nodule in the left inferior upper lung thought to be inflammatory versus neoplastic process.  There is also evidence of prominent mediastinal lymph nodes thought to be inflammatory versus metastatic.  Patient is a former smoker with a 35+ pack year smoking history. -Patient will need outpatient follow-up with either PET-CT, or tissue sampling, I have discussed pulmonary nodule findings with the patient, and he did understand the importance of outpatient follow-up.  Microscopic hematuria UA with evidence of microscopic hematuria. -Patient is a former smoker.  He will need outpatient follow-up   Hyperlipidemia -Continue Lipitor  Peripheral arterial disease -Continue home cilostazol, Plavix  Hypokalemia -Repleted, recheck in a.m.  Leukocytosis -trend  COPD -continue home meds  Hypertension -Blood pressure was soft.  Hold home antihypertensives at this time.   Beatties mellitus, type II -Hold metformin, continue with insulin sliding scale during hospital stay -A1C: 6.8-- consistent with priors   Malnutrition Type:  Nutrition Problem: Severe Malnutrition Etiology: chronic illness(COPD)   Malnutrition Characteristics:  Signs/Symptoms: severe fat depletion, severe muscle depletion, energy intake < 75% for > or equal to 3 months   Nutrition Interventions:  Interventions: Ensure Enlive (each supplement provides 350kcal and 20 grams of protein)    Family Communication/Anticipated D/C date and plan/Code Status   DVT prophylaxis: Lovenox ordered. Code Status: Full Code.  Family Communication: none At bedside Disposition Plan: Consult PT   Medical Consultants:    None.      Objective:    Vitals:   01/14/19 1959 01/15/19 0026 01/15/19 0445 01/15/19 0824  BP:  (!) 122/59 (!) 145/66   Pulse: 86 89 97 (!) 101  Resp: 20 18  20 16  Temp:  99.3 F (37.4 C) 99.8 F (37.7 C)   TempSrc:  Oral Oral   SpO2: 95% 97% 93% 94%  Weight:   59.4 kg   Height:         Intake/Output Summary (Last 24 hours) at 01/15/2019 1057 Last data filed at 01/15/2019 0643 Gross per 24 hour  Intake 705.37 ml  Output 300 ml  Net 405.37 ml   Filed Weights   01/13/19 0439 01/14/19 0453 01/15/19 0445  Weight: 57.2 kg 58.6 kg 59.4 kg    Exam: Awake Alert, Oriented X 3, frail, laying in bed in no apparent distress  symmetrical Chest wall movement, minutes breath sounds at the bases, no wheezing liters nasal cannula RRR,No Gallops,Rubs or new Murmurs, No Parasternal Heave +ve B.Sounds, Abd Soft, No tenderness, No rebound - guarding or rigidity. No Cyanosis, Clubbing or edema, No new Rash or bruise     Data Reviewed:   I have personally reviewed following labs and imaging studies:  Labs: Labs show the following:   Basic Metabolic Panel: Recent Labs  Lab 01/12/19 1649 01/14/19 0609  NA 138 140  K 3.6 3.0*  CL 99 107  CO2 24 24  GLUCOSE 192* 130*  BUN 14 13  CREATININE 1.01 0.92  CALCIUM 9.5 8.2*  MG  --  1.8   GFR Estimated Creatinine Clearance: 61 mL/min (by C-G formula based on SCr of 0.92 mg/dL). Liver Function Tests: No results for input(s): AST, ALT, ALKPHOS, BILITOT, PROT, ALBUMIN in the last 168 hours. No results for input(s): LIPASE, AMYLASE in the last 168 hours. No results for input(s): AMMONIA in the last 168 hours. Coagulation profile No results for input(s): INR, PROTIME in the last 168 hours.  CBC: Recent Labs  Lab 01/12/19 1649 01/13/19 0519 01/14/19 0609  WBC 18.5* 14.0* 12.2*  NEUTROABS 15.9*  --   --   HGB 15.2 12.6* 11.3*  HCT 44.7 36.7* 33.5*  MCV 89.8 90.8 90.3  PLT 162 149* 147*   Cardiac Enzymes: Recent Labs  Lab 01/12/19 1649 01/13/19 0020 01/13/19 0341 01/13/19 0908  TROPONINI 0.04* 0.03* 0.05* <0.03   BNP (last 3 results) No results for input(s): PROBNP in the last 8760 hours. CBG: Recent Labs  Lab 01/14/19 0740 01/14/19 1153 01/14/19 1712 01/14/19 2102 01/15/19 0748  GLUCAP 128* 183* 194*  162* 139*   D-Dimer: Recent Labs    01/12/19 1649  DDIMER 4.08*   Hgb A1c: Recent Labs    01/13/19 0519  HGBA1C 6.8*   Lipid Profile: No results for input(s): CHOL, HDL, LDLCALC, TRIG, CHOLHDL, LDLDIRECT in the last 72 hours. Thyroid function studies: No results for input(s): TSH, T4TOTAL, T3FREE, THYROIDAB in the last 72 hours.  Invalid input(s): FREET3 Anemia work up: No results for input(s): VITAMINB12, FOLATE, FERRITIN, TIBC, IRON, RETICCTPCT in the last 72 hours. Sepsis Labs: Recent Labs  Lab 01/12/19 1649 01/12/19 1932 01/13/19 0519 01/14/19 0609  WBC 18.5*  --  14.0* 12.2*  LATICACIDVEN  --  1.80  --   --     Microbiology Recent Results (from the past 240 hour(s))  Blood Culture (routine x 2)     Status: None (Preliminary result)   Collection Time: 01/12/19  7:00 PM  Result Value Ref Range Status   Specimen Description BLOOD LEFT FOREARM  Final   Special Requests   Final    BOTTLES DRAWN AEROBIC AND ANAEROBIC Blood Culture adequate volume   Culture   Final  NO GROWTH 3 DAYS Performed at [facility], [place] [address], [place], [place] [address]    Report Status PENDING  Incomplete  Blood Culture (routine x 2)     Status: None (Preliminary result)   Collection Time: 01/12/19  7:15 PM  Result Value Ref Range Status   Specimen Description BLOOD LEFT ANTECUBITAL  Final   Special Requests   Final    BOTTLES DRAWN AEROBIC AND ANAEROBIC Blood Culture adequate volume   Culture   Final    NO GROWTH 3 DAYS Performed at [facility], [place] [address] [place], [place] [address]    Report Status PENDING  Incomplete    Procedures and diagnostic studies:  No results found.  Medications:   . atorvastatin  40 mg Oral Daily  . cilostazol  50 mg Oral BID  . clopidogrel  75 mg Oral Daily  . dextromethorphan-guaiFENesin  2 tablet Oral BID  . enoxaparin (LOVENOX) injection  40 mg Subcutaneous Q24H  . feeding supplement (ENSURE ENLIVE)  237 mL Oral BID  BM  . [START ON 01/16/2019] Influenza vac split quadrivalent PF  0.5 mL Intramuscular Tomorrow-1000  . insulin aspart  0-5 Units Subcutaneous QHS  . insulin aspart  0-9 Units Subcutaneous TID WC  . ipratropium-albuterol  3 mL Nebulization Q6H   Continuous Infusions: . sodium chloride 1,000 mL (01/14/19 0545)  . azithromycin 500 mg (01/14/19 1940)  . ceFEPime (MAXIPIME) IV 1 g (01/15/19 0558)     LOS: 1 day   Phillips Climes MD [facility]   *Please refer to [email or web address], password [email or web address] to get updated schedule on who will round on this patient, as hospitalists switch teams weekly. If 7PM-7AM, please contact night-coverage at [email or web address], password [email or web address] for any overnight needs.  01/15/2019, 10:57 AM

## 2019-01-16 LAB — CBC
HCT: 32.7 % — ABNORMAL LOW (ref 39.0–52.0)
Hemoglobin: 11.2 g/dL — ABNORMAL LOW (ref 13.0–17.0)
MCH: 30.6 pg (ref 26.0–34.0)
MCHC: 34.3 g/dL (ref 30.0–36.0)
MCV: 89.3 fL (ref 80.0–100.0)
Platelets: 189 10*3/uL (ref 150–400)
RBC: 3.66 MIL/uL — ABNORMAL LOW (ref 4.22–5.81)
RDW: 12.1 % (ref 11.5–15.5)
WBC: 12 10*3/uL — ABNORMAL HIGH (ref 4.0–10.5)
nRBC: 0 % (ref 0.0–0.2)

## 2019-01-16 LAB — BASIC METABOLIC PANEL
Anion gap: 9 (ref 5–15)
BUN: 10 mg/dL (ref 8–23)
CO2: 24 mmol/L (ref 22–32)
CREATININE: 0.77 mg/dL (ref 0.61–1.24)
Calcium: 8.3 mg/dL — ABNORMAL LOW (ref 8.9–10.3)
Chloride: 103 mmol/L (ref 98–111)
GFR calc Af Amer: >60 mL/min (ref >60)
GFR calc non Af Amer: >60 mL/min (ref >60)
Glucose, Bld: 154 mg/dL — ABNORMAL HIGH (ref 70–99)
Potassium: 3.8 mmol/L (ref 3.5–5.1)
Sodium: 136 mmol/L (ref 135–145)

## 2019-01-16 LAB — GLUCOSE, CAPILLARY
Glucose-Capillary: 166 mg/dL — ABNORMAL HIGH (ref 70–99)
Glucose-Capillary: 197 mg/dL — ABNORMAL HIGH (ref 70–99)
Glucose-Capillary: 175 mg/dL — ABNORMAL HIGH (ref 70–99)
Glucose-Capillary: 161 mg/dL — ABNORMAL HIGH (ref 70–99)

## 2019-01-16 MED ORDER — IRBESARTAN 300 MG PO TABS
300.0000 mg | ORAL_TABLET | Freq: Every day | ORAL | Status: DC
  Administered 2019-01-16 – 2019-01-18 (×3): 300 mg via ORAL
  Filled 2019-01-16 (×3): qty 1

## 2019-01-16 MED ORDER — CARVEDILOL 6.25 MG PO TABS
6.2500 mg | ORAL_TABLET | Freq: Two times a day (BID) | ORAL | Status: DC
  Administered 2019-01-16 – 2019-01-18 (×4): 6.25 mg via ORAL
  Filled 2019-01-16 (×4): qty 1

## 2019-01-16 NOTE — Progress Notes (Signed)
Progress Note    Patrick [name]  YTK:[id number] DOB: April 19, 1946  DOA: 01/12/2019 PCP: Mackie Pai, PA-C    Brief Narrative:    Medical records reviewed and are as summarized below:  Patrick [name] is an 73 y.o. male with medical history significant of CAD, COPD, hypertension, hyperlipidemia presenting to the hospital complaining of cough and dyspnea on exertion.  Patient reports having a 3 to 4-day history of minimally productive cough and dyspnea on exertion.  States symptoms have been getting progressively worse.  Reports having low-grade fevers and fatigue.  Denies having any chest pain or wheezing.  Denies having any abdominal pain, nausea, vomiting, or diarrhea.  States he is a former 1 pack/day x >35-year smoker, quit in 2017.  Subjective:   he feeling better today, dyspnea has improved, cough still present, less productive today .  Assessment/Plan:   Principal Problem:   CAP (community acquired pneumonia) Active Problems:   HLD (hyperlipidemia)   PAD (peripheral artery disease) (HCC)   Acute respiratory failure with hypoxia (HCC)   Elevated troponin   Pulmonary nodule  Acute hypoxic respiratory failure secondary to community-acquired pneumonia -Reports dyspnea has improved, remains on 3 L nasal cannula, instructed staff to wean his O2 . -Influenza panel is negative, pneumonia antigen is negative as well, negative as well -CTA negative for PE but shows evidence of right lower lobe pneumonia and bronchitis -Given vancomycin, cefepime, metronidazole, and aztreonam in the ED.   coverage narrowed to cefepime and azithromycin.  Patient is allergic to Keflex but has been able to tolerate cefepime without any allergic reaction -Encouraged to use flutter valve, incentive spirometry, he is on Mucinex  Troponinemia Troponin 0.04 >0.03.  EKG not suggestive of ACS.   -suspect mild troponin elevation likely secondary to demand ischemia. -Low on 2D echo  Pulmonary nodule -1.6 x  1.2 cm nodule in the left inferior upper lung thought to be inflammatory versus neoplastic process.  There is also evidence of prominent mediastinal lymph nodes thought to be inflammatory versus metastatic.  Patient is a former smoker with a 35+ pack year smoking history. -Patient will need outpatient follow-up with either PET-CT, or tissue sampling, I have discussed pulmonary nodule findings with the patient, and he did understand the importance of outpatient follow-up.  Microscopic hematuria UA with evidence of microscopic hematuria. -Patient is a former smoker.  He will need outpatient follow-up   Hyperlipidemia -Continue Lipitor  Peripheral arterial disease -Continue home cilostazol, Plavix  Hypokalemia -Repleted.  Leukocytosis -trend  COPD -continue home meds  Hypertension -Pressure initially soft on admission, now patient is hypertensive, will resume on Benicar and Coreg, continue to hold hydrochlorothiazide  Diabetes mellitus, type II -Hold metformin, continue with insulin sliding scale during hospital stay -A1C: 6.8-- consistent with priors   Malnutrition Type:  Nutrition Problem: Severe Malnutrition Etiology: chronic illness(COPD)   Malnutrition Characteristics:  Signs/Symptoms: severe fat depletion, severe muscle depletion, energy intake < 75% for > or equal to 3 months   Nutrition Interventions:  Interventions: Ensure Enlive (each supplement provides 350kcal and 20 grams of protein)    Family Communication/Anticipated D/C date and plan/Code Status   DVT prophylaxis: Lovenox ordered. Code Status: Full Code.  Family Communication: none At bedside Disposition Plan: Home in 1 to 2 days if continues to improve, no further requirement of oxygen   Medical Consultants:    None.   Objective:    Vitals:   01/15/19 1933 01/15/19 2009 01/16/19 0525 01/16/19 0818  BP:  Marland Kitchen)  115/56 (!) 153/70   Pulse: 85 (!) 105 (!) 108   Resp: 18 (!) 22 (!) 22     Temp:  98.7 F (37.1 C) 99.3 F (37.4 C)   TempSrc:  Oral Oral   SpO2: 95% 95% 96% 94%  Weight:   59.6 kg   Height:        Intake/Output Summary (Last 24 hours) at 01/16/2019 0953 Last data filed at 01/16/2019 0600 Gross per 24 hour  Intake 277.6 ml  Output 250 ml  Net 27.6 ml   Filed Weights   01/14/19 0453 01/15/19 0445 01/16/19 0525  Weight: 58.6 kg 59.4 kg 59.6 kg    Exam:   Awake Alert, Oriented X 3, No new F.N deficits, Normal affect Symmetrical Chest wall movement, patient with no wheezing or use of accessory muscles, has bibasilar rales RRR,No Gallops,Rubs or new Murmurs, No Parasternal Heave +ve B.Sounds, Abd Soft, No tenderness, No rebound - guarding or rigidity. No Cyanosis, Clubbing or edema, No new Rash or bruise    Data Reviewed:   I have personally reviewed following labs and imaging studies:  Labs: Labs show the following:   Basic Metabolic Panel: Recent Labs  Lab 01/12/19 1649 01/14/19 0609 01/16/19 0557  NA 138 140 136  K 3.6 3.0* 3.8  CL 99 107 103  CO2 24 24 24   GLUCOSE 192* 130* 154*  BUN 14 13 10   CREATININE 1.01 0.92 0.77  CALCIUM 9.5 8.2* 8.3*  MG  --  1.8  --    GFR Estimated Creatinine Clearance: 70.4 mL/min (by C-G formula based on SCr of 0.77 mg/dL). Liver Function Tests: No results for input(s): AST, ALT, ALKPHOS, BILITOT, PROT, ALBUMIN in the last 168 hours. No results for input(s): LIPASE, AMYLASE in the last 168 hours. No results for input(s): AMMONIA in the last 168 hours. Coagulation profile No results for input(s): INR, PROTIME in the last 168 hours.  CBC: Recent Labs  Lab 01/12/19 1649 01/13/19 0519 01/14/19 0609 01/16/19 0557  WBC 18.5* 14.0* 12.2* 12.0*  NEUTROABS 15.9*  --   --   --   HGB 15.2 12.6* 11.3* 11.2*  HCT 44.7 36.7* 33.5* 32.7*  MCV 89.8 90.8 90.3 89.3  PLT 162 149* 147* 189   Cardiac Enzymes: Recent Labs  Lab 01/12/19 1649 01/13/19 0020 01/13/19 0341 01/13/19 0908  TROPONINI 0.04*  0.03* 0.05* <0.03   BNP (last 3 results) No results for input(s): PROBNP in the last 8760 hours. CBG: Recent Labs  Lab 01/15/19 0748 01/15/19 1159 01/15/19 1621 01/15/19 2116 01/16/19 0815  GLUCAP 139* 201* 152* 210* 166*   D-Dimer: No results for input(s): DDIMER in the last 72 hours. Hgb A1c: No results for input(s): HGBA1C in the last 72 hours. Lipid Profile: No results for input(s): CHOL, HDL, LDLCALC, TRIG, CHOLHDL, LDLDIRECT in the last 72 hours. Thyroid function studies: No results for input(s): TSH, T4TOTAL, T3FREE, THYROIDAB in the last 72 hours.  Invalid input(s): FREET3 Anemia work up: No results for input(s): VITAMINB12, FOLATE, FERRITIN, TIBC, IRON, RETICCTPCT in the last 72 hours. Sepsis Labs: Recent Labs  Lab 01/12/19 1649 01/12/19 1932 01/13/19 0519 01/14/19 0609 01/16/19 0557  WBC 18.5*  --  14.0* 12.2* 12.0*  LATICACIDVEN  --  1.80  --   --   --     Microbiology Recent Results (from the past 240 hour(s))  Blood Culture (routine x 2)     Status: None (Preliminary result)   Collection Time: 01/12/19  7:00 PM  Result Value Ref Range Status   Specimen Description BLOOD LEFT FOREARM  Final   Special Requests   Final    BOTTLES DRAWN AEROBIC AND ANAEROBIC Blood Culture adequate volume   Culture   Final    NO GROWTH 3 DAYS Performed at [facility], [address], [place], [place] [address]    Report Status PENDING  Incomplete  Blood Culture (routine x 2)     Status: None (Preliminary result)   Collection Time: 01/12/19  7:15 PM  Result Value Ref Range Status   Specimen Description BLOOD LEFT ANTECUBITAL  Final   Special Requests   Final    BOTTLES DRAWN AEROBIC AND ANAEROBIC Blood Culture adequate volume   Culture   Final    NO GROWTH 3 DAYS Performed at [facility], [place] [address] [place], [place] [address]    Report Status PENDING  Incomplete    Procedures and diagnostic studies:  No results found.  Medications:   .  atorvastatin  40 mg Oral Daily  . cilostazol  50 mg Oral BID  . clopidogrel  75 mg Oral Daily  . dextromethorphan-guaiFENesin  2 tablet Oral BID  . enoxaparin (LOVENOX) injection  40 mg Subcutaneous Q24H  . feeding supplement (ENSURE ENLIVE)  237 mL Oral BID BM  . Influenza vac split quadrivalent PF  0.5 mL Intramuscular Tomorrow-1000  . insulin aspart  0-5 Units Subcutaneous QHS  . insulin aspart  0-9 Units Subcutaneous TID WC  . ipratropium-albuterol  3 mL Nebulization Q6H   Continuous Infusions: . sodium chloride 1,000 mL (01/14/19 0545)  . azithromycin 500 mg (01/15/19 2004)  . ceFEPime (MAXIPIME) IV 1 g (01/16/19 0556)     LOS: 2 days   Phillips Climes MD [facility]   *Please refer to [email or web address], password [email or web address] to get updated schedule on who will round on this patient, as hospitalists switch teams weekly. If 7PM-7AM, please contact night-coverage at [email or web address], password [email or web address] for any overnight needs.  01/16/2019, 9:53 AM

## 2019-01-17 LAB — GLUCOSE, CAPILLARY
Glucose-Capillary: 159 mg/dL — ABNORMAL HIGH (ref 70–99)
Glucose-Capillary: 166 mg/dL — ABNORMAL HIGH (ref 70–99)
Glucose-Capillary: 114 mg/dL — ABNORMAL HIGH (ref 70–99)
Glucose-Capillary: 227 mg/dL — ABNORMAL HIGH (ref 70–99)

## 2019-01-17 LAB — CULTURE, BLOOD (ROUTINE X 2)
Culture: NO GROWTH
Culture: NO GROWTH
Special Requests: ADEQUATE
Special Requests: ADEQUATE

## 2019-01-17 MED ORDER — IPRATROPIUM-ALBUTEROL 0.5-2.5 (3) MG/3ML IN SOLN
3.0000 mL | Freq: Three times a day (TID) | RESPIRATORY_TRACT | Status: DC
  Administered 2019-01-17 – 2019-01-18 (×4): 3 mL via RESPIRATORY_TRACT
  Filled 2019-01-17 (×5): qty 3

## 2019-01-17 NOTE — Plan of Care (Signed)
  Problem: Clinical Measurements: Goal: Diagnostic test results will improve Outcome: Progressing   Problem: Clinical Measurements: Goal: Respiratory complications will improve Outcome: Progressing   Problem: Clinical Measurements: Goal: Cardiovascular complication will be avoided Outcome: Progressing   

## 2019-01-17 NOTE — Plan of Care (Signed)
  Problem: Activity: Goal: Risk for activity intolerance will decrease Outcome: Progressing   Problem: Safety: Goal: Ability to remain free from injury will improve Outcome: Progressing   

## 2019-01-17 NOTE — Progress Notes (Signed)
Progress Note    Patrick [name]  OFH:[id number] DOB: [birth year]  DOA: 01/12/2019 PCP: Patrick Pai, PA-C    Brief Narrative:   Patrick [name] is an 73 y.o. male with medical history significant of CAD, COPD, hypertension, hyperlipidemia presenting to the hospital complaining of cough and dyspnea on exertion.  Patient reports having a 3 to 4-day history of minimally productive cough and dyspnea on exertion.  States symptoms have been getting progressively worse.  Reports having low-grade fevers and fatigue.  Denies having any chest pain or wheezing.  Denies having any abdominal pain, nausea, vomiting, or diarrhea.  States he is a former 1 pack/day x >35-year smoker, quit in 2017.  Subjective:   he feeling better today, dyspnea has improved, cough still present, more productive today.,  He had temperature 100.6 today  Assessment/Plan:   Principal Problem:   CAP (community acquired pneumonia) Active Problems:   HLD (hyperlipidemia)   PAD (peripheral artery disease) (HCC)   Acute respiratory failure with hypoxia (HCC)   Elevated troponin   Pulmonary nodule  Acute hypoxic respiratory failure secondary to community-acquired pneumonia -Reports dyspnea has improved, remains on 3 L nasal cannula, instructed staff to wean his O2 . -Influenza panel is negative, pneumonia antigen is negative as well, negative as well -CTA negative for PE but shows evidence of right lower lobe pneumonia and bronchitis -Given vancomycin, cefepime, metronidazole, and aztreonam in the ED.   coverage narrowed to cefepime and azithromycin.  Patient is allergic to Keflex but has been able to tolerate cefepime without any allergic reaction, he is still with low-grade temperature 100.6 today, will continue with IV antibiotics -On 3 L nasal cannula, staff unable to wean, will ambulate in the hallway to see if he qualifies for home oxygen -Encouraged to use flutter valve, incentive spirometry, he is on  Mucinex  Troponinemia Troponin 0.04 >0.03.  EKG not suggestive of ACS.   -suspect mild troponin elevation likely secondary to demand ischemia.  Pulmonary nodule -1.6 x 1.2 cm nodule in the left inferior upper lung thought to be inflammatory versus neoplastic process.  There is also evidence of prominent mediastinal lymph nodes thought to be inflammatory versus metastatic.  Patient is a former smoker with a 35+ pack year smoking history. -Patient will need outpatient follow-up with either PET-CT, or tissue sampling, I have discussed pulmonary nodule findings with the patient, and he did understand the importance of outpatient follow-up.  Microscopic hematuria UA with evidence of microscopic hematuria. -Patient is a former smoker.  He will need outpatient follow-up, I have discussed with the patient  Hyperlipidemia -Continue Lipitor  Peripheral arterial disease -Continue home cilostazol, Plavix  Hypokalemia -Repleted.  Leukocytosis -trend  COPD -continue home meds  Hypertension -Pressure initially soft on admission, now patient is hypertensive, will resume on Benicar and Coreg, continue to hold hydrochlorothiazide  Diabetes mellitus, type II -Hold metformin, continue with insulin sliding scale during hospital stay -A1C: 6.8-- consistent with priors   Malnutrition Type:  Nutrition Problem: Severe Malnutrition Etiology: chronic illness(COPD)   Malnutrition Characteristics:  Signs/Symptoms: severe fat depletion, severe muscle depletion, energy intake < 75% for > or equal to 3 months   Nutrition Interventions:  Interventions: Ensure Enlive (each supplement provides 350kcal and 20 grams of protein)    Family Communication/Anticipated D/C date and plan/Code Status   DVT prophylaxis: Lovenox ordered. Code Status: Full Code.  Family Communication: none At bedside Disposition Plan: Home in 1 to 2 days if continues to improve, no  further requirement of  oxygen   Medical Consultants:    None.   Objective:    Vitals:   01/16/19 2050 01/17/19 0547 01/17/19 1110 01/17/19 1144  BP:  139/64  (!) 113/56  Pulse:  (!) 103  90  Resp:  20  18  Temp:  (!) 100.6 F (38.1 C)  97.9 F (36.6 C)  TempSrc:  Oral  Oral  SpO2: 96% 93% 95% 98%  Weight:  59.1 kg    Height:        Intake/Output Summary (Last 24 hours) at 01/17/2019 1202 Last data filed at 01/17/2019 1000 Gross per 24 hour  Intake 1110 ml  Output 1003 ml  Net 107 ml   Filed Weights   01/15/19 0445 01/16/19 0525 01/17/19 0547  Weight: 59.4 kg 59.6 kg 59.1 kg    Exam:   Awake Alert, Oriented X 3, No new F.N deficits, Normal affect Symmetrical Chest wall movement, Good air movement bilaterally, does have coarse respiratory sound today RRR,No Gallops,Rubs or new Murmurs, No Parasternal Heave +ve B.Sounds, Abd Soft, No tenderness, No rebound - guarding or rigidity. No Cyanosis, Clubbing or edema, No new Rash or bruise     Data Reviewed:   I have personally reviewed following labs and imaging studies:  Labs: Labs show the following:   Basic Metabolic Panel: Recent Labs  Lab 01/12/19 1649 01/14/19 0609 01/16/19 0557  NA 138 140 136  K 3.6 3.0* 3.8  CL 99 107 103  CO2 24 24 24   GLUCOSE 192* 130* 154*  BUN 14 13 10   CREATININE 1.01 0.92 0.77  CALCIUM 9.5 8.2* 8.3*  MG  --  1.8  --    GFR Estimated Creatinine Clearance: 69.8 mL/min (by C-G formula based on SCr of 0.77 mg/dL). Liver Function Tests: No results for input(s): AST, ALT, ALKPHOS, BILITOT, PROT, ALBUMIN in the last 168 hours. No results for input(s): LIPASE, AMYLASE in the last 168 hours. No results for input(s): AMMONIA in the last 168 hours. Coagulation profile No results for input(s): INR, PROTIME in the last 168 hours.  CBC: Recent Labs  Lab 01/12/19 1649 01/13/19 0519 01/14/19 0609 01/16/19 0557  WBC 18.5* 14.0* 12.2* 12.0*  NEUTROABS 15.9*  --   --   --   HGB 15.2 12.6* 11.3*  11.2*  HCT 44.7 36.7* 33.5* 32.7*  MCV 89.8 90.8 90.3 89.3  PLT 162 149* 147* 189   Cardiac Enzymes: Recent Labs  Lab 01/12/19 1649 01/13/19 0020 01/13/19 0341 01/13/19 0908  TROPONINI 0.04* 0.03* 0.05* <0.03   BNP (last 3 results) No results for input(s): PROBNP in the last 8760 hours. CBG: Recent Labs  Lab 01/16/19 1230 01/16/19 1633 01/16/19 2106 01/17/19 0744 01/17/19 1141  GLUCAP 197* 175* 161* 159* 166*   D-Dimer: No results for input(s): DDIMER in the last 72 hours. Hgb A1c: No results for input(s): HGBA1C in the last 72 hours. Lipid Profile: No results for input(s): CHOL, HDL, LDLCALC, TRIG, CHOLHDL, LDLDIRECT in the last 72 hours. Thyroid function studies: No results for input(s): TSH, T4TOTAL, T3FREE, THYROIDAB in the last 72 hours.  Invalid input(s): FREET3 Anemia work up: No results for input(s): VITAMINB12, FOLATE, FERRITIN, TIBC, IRON, RETICCTPCT in the last 72 hours. Sepsis Labs: Recent Labs  Lab 01/12/19 1649 01/12/19 1932 01/13/19 0519 01/14/19 0609 01/16/19 0557  WBC 18.5*  --  14.0* 12.2* 12.0*  LATICACIDVEN  --  1.80  --   --   --     Microbiology Recent  Results (from the past 240 hour(s))  Blood Culture (routine x 2)     Status: None (Preliminary result)   Collection Time: 01/12/19  7:00 PM  Result Value Ref Range Status   Specimen Description BLOOD LEFT FOREARM  Final   Special Requests   Final    BOTTLES DRAWN AEROBIC AND ANAEROBIC Blood Culture adequate volume   Culture   Final    NO GROWTH 4 DAYS Performed at [facility], [address], [place], [place] [address]    Report Status PENDING  Incomplete  Blood Culture (routine x 2)     Status: None (Preliminary result)   Collection Time: 01/12/19  7:15 PM  Result Value Ref Range Status   Specimen Description BLOOD LEFT ANTECUBITAL  Final   Special Requests   Final    BOTTLES DRAWN AEROBIC AND ANAEROBIC Blood Culture adequate volume   Culture   Final    NO GROWTH 4  DAYS Performed at [facility], [place] [address] [place], [place] [address]    Report Status PENDING  Incomplete    Procedures and diagnostic studies:  No results found.  Medications:   . atorvastatin  40 mg Oral Daily  . carvedilol  6.25 mg Oral BID WC  . cilostazol  50 mg Oral BID  . clopidogrel  75 mg Oral Daily  . dextromethorphan-guaiFENesin  2 tablet Oral BID  . enoxaparin (LOVENOX) injection  40 mg Subcutaneous Q24H  . feeding supplement (ENSURE ENLIVE)  237 mL Oral BID BM  . Influenza vac split quadrivalent PF  0.5 mL Intramuscular Tomorrow-1000  . insulin aspart  0-5 Units Subcutaneous QHS  . insulin aspart  0-9 Units Subcutaneous TID WC  . ipratropium-albuterol  3 mL Nebulization TID  . irbesartan  300 mg Oral Daily   Continuous Infusions: . sodium chloride 1,000 mL (01/14/19 0545)  . azithromycin 500 mg (01/16/19 2036)  . ceFEPime (MAXIPIME) IV 1 g (01/17/19 0558)     LOS: 3 days   Phillips Climes MD [facility]   *Please refer to [email or web address], password [email or web address] to get updated schedule on who will round on this patient, as hospitalists switch teams weekly. If 7PM-7AM, please contact night-coverage at [email or web address], password [email or web address] for any overnight needs.  01/17/2019, 12:02 PM

## 2019-01-17 NOTE — Progress Notes (Signed)
SATURATION QUALIFICATIONS: (This note is used to comply with regulatory documentation for home oxygen)  Patient Saturations on Room Air at Rest = 94%  Patient Saturations on Room Air while Ambulating = 89-92%  Patient Saturations on 0 Liters of oxygen while Ambulating =0 %  Please briefly explain why patient needs home oxygen: based on most recent o2 sat while ambulating patient will not require home 02.  Patient tolerated 02 off for at least 5 hours prior to walking in hall.  02 remains off. Will observe.

## 2019-01-18 LAB — GLUCOSE, CAPILLARY
GLUCOSE-CAPILLARY: 162 mg/dL — AB (ref 70–99)
Glucose-Capillary: 170 mg/dL — ABNORMAL HIGH (ref 70–99)

## 2019-01-18 MED ORDER — IPRATROPIUM-ALBUTEROL 0.5-2.5 (3) MG/3ML IN SOLN: 3.0000 mL | Freq: Four times a day (QID) | RESPIRATORY_TRACT | 0 refills | Status: AC | PRN

## 2019-01-18 MED ORDER — ACETAMINOPHEN 325 MG PO TABS: 650.0000 mg | ORAL_TABLET | Freq: Four times a day (QID) | ORAL | Status: AC | PRN

## 2019-01-18 MED ORDER — ENSURE ENLIVE PO LIQD: 237.0000 mL | Freq: Two times a day (BID) | ORAL | 12 refills | Status: DC

## 2019-01-18 MED ORDER — AZITHROMYCIN 500 MG PO TABS: 500.0000 mg | ORAL_TABLET | Freq: Once | ORAL | 0 refills | Status: AC

## 2019-01-18 MED ORDER — CEFPODOXIME PROXETIL 200 MG PO TABS: 200.0000 mg | ORAL_TABLET | Freq: Two times a day (BID) | ORAL | 0 refills | Status: DC

## 2019-01-18 NOTE — Discharge Summary (Signed)
Patrick [name], is a 73 y.o. male  DOB February 23, 1946  MRN [id number].  Admission date:  01/12/2019  Admitting Physician  Shela Leff, MD  Discharge Date:  01/18/2019   Primary MD  Saguier, Percell Miller, PA-C  Recommendations for primary care physician for things to follow:  - -Patient will need outpatient follow-up with either PET-CT,or tissue sampling, for finding of pulmonary nodule , see discussion below -We will need outpatient follow-up for microscopic hematuria .   Admission Diagnosis  Cough [R05] Hypoxemia [R09.02] Community acquired pneumonia of right lower lobe of lung ([place]) [J18.1] CAP (community acquired pneumonia) [J18.9]   Discharge Diagnosis  Cough [R05] Hypoxemia [R09.02] Community acquired pneumonia of right lower lobe of lung ([place]) [J18.1] CAP (community acquired pneumonia) [J18.9]    Principal Problem:   CAP (community acquired pneumonia) Active Problems:   HLD (hyperlipidemia)   PAD (peripheral artery disease) ([place])   Sepsis ([place])   Acute respiratory failure with hypoxia (HCC)   Elevated troponin   Pulmonary nodule   Protein-calorie malnutrition, severe      Past Medical History:  Diagnosis Date  . CAD (coronary artery disease)   . COPD (chronic obstructive pulmonary disease) ([place])   . Hyperlipidemia   . Hypertension     Past Surgical History:  Procedure Laterality Date  . CARDIAC CATHETERIZATION N/A 12/30/2015   Procedure: Left Heart Cath and Coronary Angiography;  Surgeon: Adrian Prows, MD;  Location: [facility];  Service: Cardiovascular;  Laterality: N/A;  . EYE SURGERY     Bilateral cataract sx 2017       History of present illness and  Hospital Course:     Kindly see H&P for history of present illness and admission details, please review complete Labs, Consult reports and Test reports for all details in brief  HPI  from the history and physical done  on the day of admission  HPI: Patrick [name] is a 73 y.o. male with medical history significant of CAD, COPD, hypertension, hyperlipidemia presenting to the hospital complaining of cough and dyspnea on exertion.  Patient reports having a 3 to 4-day history of minimally productive cough and dyspnea on exertion.  States symptoms have been getting progressively worse.  Reports having low-grade fevers and fatigue.  Denies having any chest pain or wheezing.  Denies having any abdominal pain, nausea, vomiting, or diarrhea.  States he is a former 1 pack/day x >35-year smoker, quit in 2017.   Hospital Course   Acute hypoxic respiratory failure/sepsis secondary to community-acquired pneumonia -Sepsis present on admission, tachypneic, tachycardic, with leukocytosis and respiratory failure. -Influenza panel is negative, pneumonia antigen is negative as well, cultures are negative at time of discharge as well. -CTA chest negative for PE, but with evidence of right lower lobe pneumonia and bronchitis, he was treated with IV azithromycin and cefepime during hospital stay, and his total of 7 days treatment given his fragility, and intermittent fever, he will be discharged on azithromycin and Vantin(tolerated cefepime during hospital stay). -CTA negative for PE  but shows evidence of right lower lobe pneumonia and bronchitis -He is hypoxic at time of discharge, qualified for oxygen, 2 L nasal cannula -Encouraged to use flutter valve, incentive spirometry, he is on Mucinex  Troponinemia Troponin 0.04>0.03. EKG not suggestive of ACS.  -suspect mild troponin elevation likely secondary to demand ischemia.  Pulmonary nodule -1.6 x 1.2 cm nodule in the left inferior upper lung thought to be inflammatory versus neoplastic process. There is also evidence of prominent mediastinal lymph nodes thought to be inflammatory versus metastatic. Patient is a former smoker with a35+ pack year smoking history. -Patient will  need outpatient follow-up with either PET-CT,or tissue sampling, I have discussed pulmonary nodule findings with the patient, and he did understand the importance of outpatient follow-up.  Microscopic hematuria UA with evidence of microscopic hematuria. -Patient is a former smoker. He will need outpatient follow-up, I have discussed with the patient  Hyperlipidemia -Continue Lipitor  Peripheral arterial disease -Continue home cilostazol, Plavix  Hypokalemia -Repleted.  Leukocytosis -trend  COPD -continue home meds  Hypertension -Blood pressure remains soft, hydrochlorothiazide has been stopped, will be discharged on his home dose beta-blockers and are up .  Diabetes mellitus, type II -A1C: 6.8-- consistent with priors, resume metformin on discharge  Severe Malnutrition -Continue with Ensure on discharge    Discharge Condition:  Stable   Follow UP  Follow-up Information    Saguier, Percell Miller, PA-C Follow up in 1 week(s).   Specialties:  Internal Medicine, Family Medicine Contact information: [address] [place] [address] [phone]             Discharge Instructions  and  Discharge Medications     Discharge Instructions    Diet - low sodium heart healthy   Complete by:  As directed    Increase activity slowly   Complete by:  As directed      Allergies as of 01/18/2019      Reactions   Aspirin Swelling   Cephalexin Itching   Tolerated Cefepime 12/2018   Ibuprofen Swelling      Medication List    STOP taking these medications   hydrochlorothiazide 12.5 MG tablet Commonly known as:  HYDRODIURIL     TAKE these medications   acetaminophen 325 MG tablet Commonly known as:  TYLENOL Take 2 tablets (650 mg total) by mouth every 6 (six) hours as needed for mild pain or fever (or Fever >/= 101).   albuterol 108 (90 Base) MCG/ACT inhaler Commonly known as:  PROVENTIL HFA;VENTOLIN HFA Inhale 2 puffs into the lungs  every 6 (six) hours as needed for wheezing or shortness of breath.   atorvastatin 40 MG tablet Commonly known as:  LIPITOR Take 40 mg by mouth daily.   azithromycin 500 MG tablet Commonly known as:  ZITHROMAX Take 1 tablet (500 mg total) by mouth once for 1 dose.   carvedilol 6.25 MG tablet Commonly known as:  COREG Take 6.25 mg by mouth 2 (two) times daily with a meal.   cefpodoxime 200 MG tablet Commonly known as:  VANTIN Take 1 tablet (200 mg total) by mouth 2 (two) times daily. Patient tolerated cefepime during hospital stay   cilostazol 100 MG tablet Commonly known as:  PLETAL Take 50 mg by mouth 2 (two) times daily.   clopidogrel 75 MG tablet Commonly known as:  PLAVIX Take 75 mg by mouth daily.   dextromethorphan-guaiFENesin 30-600 MG 12hr tablet Commonly known as:  MUCINEX DM Take 1 tablet by  mouth 2 (two) times daily.   feeding supplement (ENSURE ENLIVE) Liqd Take 237 mLs by mouth 2 (two) times daily between meals.   metFORMIN 500 MG tablet Commonly known as:  GLUCOPHAGE TAKE 1 TABLET BY MOUTH TWICE DAILY WITH A MEAL What changed:  See the new instructions.   olmesartan 40 MG tablet Commonly known as:  BENICAR Take 40 mg by mouth daily.   Vitamin D (Ergocalciferol) 1.25 MG (50000 UT) Caps capsule Commonly known as:  DRISDOL Take 50,000 Units by mouth every Monday.            Durable Medical Equipment  (From admission, onward)         Start     Ordered   01/18/19 1022  DME Oxygen  Once    Question Answer Comment  Mode or (Route) Nasal cannula   Liters per Minute 2   Frequency Continuous (stationary and portable oxygen unit needed)   Oxygen conserving device Yes   Oxygen delivery system Gas      01/18/19 1021            Diet and Activity recommendation: See Discharge Instructions above   Consults obtained -  None   Major procedures and Radiology Reports - PLEASE review detailed and final reports for all details, in brief -       Dg Chest 2 View  Result Date: 01/12/2019 CLINICAL DATA:  Shortness of breath productive cough for the past 4 days. History of chronic lung disease and COPD. EXAM: CHEST - 2 VIEW COMPARISON:  Earlier today. 01/30/2016. FINDINGS: Normal sized heart. Better defined area of increased density in the left mid lung zone since earlier today, not present on 01/30/2016. This has a nodular component inferiorly and appears to have a cavitary component superiorly. Together, this area measures 2.3 x 2.2 cm. The lungs remain hyperexpanded with mild diffuse peribronchial thickening and accentuation of the interstitial markings. Mild thoracic spine degenerative changes. IMPRESSION: 1. Better defined area of increased density in the left mid lung zone since earlier today, not present on 01/30/2016. This has a probable cavitary component superiorly and nodular component inferiorly. This could be infectious or neoplastic in nature. Further evaluation with a chest CT with contrast is recommended. 2. Stable changes of COPD and chronic bronchitis. Electronically Signed   By: Claudie Revering M.D.   On: 01/12/2019 18:42   Ct Angio Chest Pe W And/or Wo Contrast  Result Date: 01/12/2019 CLINICAL DATA:  Three day history of cough. Shortness of breath with exertion. Focal lesion seen in the left mid lung on prior chest radiograph. EXAM: CT ANGIOGRAPHY CHEST WITH CONTRAST TECHNIQUE: Multidetector CT imaging of the chest was performed using the standard protocol during bolus administration of intravenous contrast. Multiplanar CT image reconstructions and MIPs were obtained to evaluate the vascular anatomy. CONTRAST:  162mL ISOVUE-370 IOPAMIDOL (ISOVUE-370) INJECTION 76% COMPARISON:  Chest radiograph 01/12/2019 FINDINGS: Cardiovascular: There is good append sick a shin of the central and segmental pulmonary arteries. No focal filling defects. No evidence of significant pulmonary embolus. Normal heart size. No pericardial effusion.  Mild anterior pericardial thickening. Calcification of the aorta. Normal caliber. Great vessel origins are patent. Mediastinum/Nodes: Prominent pretracheal and subcarinal lymph nodes, measuring up to about 1.6 cm short axis dimension. Lungs/Pleura: Diffuse emphysematous changes throughout the lungs. Diffuse peribronchial thickening with opacification or mucous plugging of some of the lower lobe bronchi bilaterally, suggesting acute or chronic bronchitis or inflammatory airways disease. There is patchy infiltration in the right lung  base posteriorly which may represent focal pneumonia. In the left inferior upper lung, corresponding to the chest x-ray abnormality, there is a circumscribed nodule measuring 1.6 x 1.2 cm in diameter. There are adjacent cystic changes likely representing adjacent emphysematous change rather than cavitation of the lesion itself. Tiny adjacent peribronchovascular nodules are also present. This lesion is indeterminate and could be inflammatory or neoplastic in nature. Suspicious for neoplasm since it was present on previous chest x-ray 1 year ago, with interval more prominent appearance. No pleural effusions. No pneumothorax. Upper Abdomen: Exophytic lesion off of the midpole left kidney probably represents a cyst. No acute changes demonstrated in the visualized upper abdomen. Musculoskeletal: No chest wall abnormality. No acute or significant osseous findings. Review of the MIP images confirms the above findings. IMPRESSION: 1. No evidence of significant pulmonary embolus. 2. Diffuse emphysematous changes in the lungs. 3. 1.6 x 1.2 cm nodule in the left inferior upper lung corresponding to chest x-ray abnormality. This is indeterminate and could represent inflammatory or neoplastic process. Tiny adjacent peribronchovascular nodules are present. Prominent mediastinal lymph nodes are nonspecific in could be metastatic or inflammatory. Consider one of the following in 3 months for both  low-risk and high-risk individuals: (a) referral to chest clinic, (b) follow-up PET-CT, or (c) tissue sampling. This recommendation follows the consensus statement: Guidelines for Management of Incidental Pulmonary Nodules Detected on CT Images: From the Fleischner Society 2017; Radiology 2017; 284:228-243. 4. Diffuse peribronchial thickening with opacification or mucous plugging in the lower lobe bronchi bilaterally, suggesting acute or chronic bronchitis or inflammatory airways disease. 5. Focal consolidation in the right lower lung likely pneumonia. Emphysema (ICD10-J43.9). Electronically Signed   By: Lucienne Capers M.D.   On: 01/12/2019 20:22    Micro Results     Recent Results (from the past 240 hour(s))  Blood Culture (routine x 2)     Status: None   Collection Time: 01/12/19  7:00 PM  Result Value Ref Range Status   Specimen Description BLOOD LEFT FOREARM  Final   Special Requests   Final    BOTTLES DRAWN AEROBIC AND ANAEROBIC Blood Culture adequate volume   Culture   Final    NO GROWTH 5 DAYS Performed at [facility], [address], [place], [place] [address]    Report Status 01/17/2019 FINAL  Final  Blood Culture (routine x 2)     Status: None   Collection Time: 01/12/19  7:15 PM  Result Value Ref Range Status   Specimen Description BLOOD LEFT ANTECUBITAL  Final   Special Requests   Final    BOTTLES DRAWN AEROBIC AND ANAEROBIC Blood Culture adequate volume   Culture   Final    NO GROWTH 5 DAYS Performed at [facility], [place] [address] [place], [place] [address]    Report Status 01/17/2019 FINAL  Final       Today   Subjective:   Patrick [name] today has no headache,no chest or abdominal pain, ports he is feeling much better today, excited about going home, reports some cough, less productive today   Objective:   Blood pressure (!) 100/46, pulse 88, temperature 98.7 F (37.1 C), temperature source Oral, resp. rate (!) 22, height 5\' 9"  (1.753 m),  weight 58.9 kg, SpO2 90 %.   Intake/Output Summary (Last 24 hours) at 01/18/2019 1024 Last data filed at 01/18/2019 0953 Gross per 24 hour  Intake 1100 ml  Output 1900 ml  Net -800 ml    Exam  Awake Alert, Oriented x 3, No new F.N deficits, Normal affect Symmetrical Chest wall movement, Good air movement bilaterally, CTAB RRR,No Gallops,Rubs or new Murmurs, No Parasternal Heave +ve B.Sounds, Abd Soft, Non tender, No rebound -guarding or rigidity. No Cyanosis, Clubbing or edema, No new Rash or bruise  Data Review   CBC w Diff:  Lab Results  Component Value Date   WBC 12.0 (H) 01/16/2019   HGB 11.2 (L) 01/16/2019   HCT 32.7 (L) 01/16/2019   PLT 189 01/16/2019   LYMPHOPCT 4 01/12/2019   MONOPCT 8 01/12/2019   EOSPCT 0 01/12/2019   BASOPCT 0 01/12/2019    CMP:  Lab Results  Component Value Date   NA 136 01/16/2019   K 3.8 01/16/2019   CL 103 01/16/2019   CO2 24 01/16/2019   BUN 10 01/16/2019   CREATININE 0.77 01/16/2019   PROT 7.3 01/12/2018   ALBUMIN 4.3 01/12/2018   BILITOT 0.8 01/12/2018   ALKPHOS 73 01/12/2018   AST 8 01/12/2018   ALT 6 01/12/2018  .   Total Time in preparing paper work, data evaluation and todays exam - 75 minutes  Phillips Climes M.D on 01/18/2019 at 10:24 AM  [facility]   Office  [phone]

## 2019-01-18 NOTE — Care Management Important Message (Signed)
Important Message  Patient Details  Name: MATS JEANLOUIS MRN: [id number] Date of Birth: 1946/10/17   Medicare Important Message Given:  Yes    Brayah Urquilla P Kinzlie Harney 01/18/2019, 2:26 PM

## 2019-01-18 NOTE — Progress Notes (Signed)
SATURATION QUALIFICATIONS: (This note is used to comply with regulatory documentation for home oxygen)  Patient Saturations on Room Air at Rest = 88%  Patient Saturations on Room Air while Ambulating = 88%  Patient Saturations on 2 Liters of oxygen while Ambulating = 91%  Please briefly explain why patient needs home oxygen:

## 2019-01-18 NOTE — Discharge Instructions (Signed)
Follow with Primary MD Saguier, Percell Miller, PA-C in 7 days   Get CBC, CMP, 2 view Chest X ray checked  by Primary MD next visit.    Activity: As tolerated with Full fall precautions use walker/cane & assistance as needed   Disposition Home    Diet: Carbohydrate modified  On your next visit with your primary care physician please Get Medicines reviewed and adjusted.   Please request your Prim.MD to go over all Hospital Tests and Procedure/Radiological results at the follow up, please get all Hospital records sent to your Prim MD by signing hospital release before you go home.   If you experience worsening of your admission symptoms, develop shortness of breath, life threatening emergency, suicidal or homicidal thoughts you must seek medical attention immediately by calling 911 or calling your MD immediately  if symptoms less severe.  You Must read complete instructions/literature along with all the possible adverse reactions/side effects for all the Medicines you take and that have been prescribed to you. Take any new Medicines after you have completely understood and accpet all the possible adverse reactions/side effects.   Do not drive, operating heavy machinery, perform activities at heights, swimming or participation in water activities or provide baby sitting services if your were admitted for syncope or siezures until you have seen by Primary MD or a Neurologist and advised to do so again.  Do not drive when taking Pain medications.    Do not take more than prescribed Pain, Sleep and Anxiety Medications  Special Instructions: If you have smoked or chewed Tobacco  in the last 2 yrs please stop smoking, stop any regular Alcohol  and or any Recreational drug use.  Wear Seat belts while driving.   Please note  You were cared for by a hospitalist during your hospital stay. If you have any questions about your discharge medications or the care you received while you were in the  hospital after you are discharged, you can call the unit and asked to speak with the hospitalist on call if the hospitalist that took care of you is not available. Once you are discharged, your primary care physician will handle any further medical issues. Please note that NO REFILLS for any discharge medications will be authorized once you are discharged, as it is imperative that you return to your primary care physician (or establish a relationship with a primary care physician if you do not have one) for your aftercare needs so that they can reassess your need for medications and monitor your lab values.

## 2019-01-18 NOTE — Care Management Note (Signed)
Case Management Note Cross Coverage for 3E on 01/18/19 Marvetta Gibbons RN, [place] [phone]  Patient Details  Name: Patrick [name] MRN: [id number] Date of Birth: 1946/04/02  Subjective/Objective:     Pt admitted with CAP               Action/Plan: PTA pt lived at home, notified by bedside RN that pt would need home 02, order has been placed per MD for DME- home 02, qualifying note done by RN. Call made to Dan with [facility] for DME needs- portable 02 tank to be delivered to room one approved by insurance for pt's transport home.   Expected Discharge Date:  01/18/19               Expected Discharge Plan:  Home/Self Care  In-House Referral:     Discharge planning Services  CM Consult  Post Acute Care Choice:  Durable Medical Equipment Choice offered to:  Patient  DME Arranged:  Oxygen DME Agency:  [place]:    [facility] Agency:     Status of Service:  Completed, signed off  If discussed at [place] of Stay Meetings, dates discussed:    Discharge Disposition: home/self care   Additional Comments:  Dawayne Patricia, RN 01/18/2019, 1:13 PM

## 2019-01-19 ENCOUNTER — Telehealth: Payer: Self-pay | Admitting: Medical

## 2019-01-19 ENCOUNTER — Telehealth: Payer: Self-pay | Admitting: *Deleted

## 2019-01-19 DIAGNOSIS — R911 Solitary pulmonary nodule: Secondary | ICD-10-CM

## 2019-01-19 NOTE — Telephone Encounter (Signed)
I am not aware that he has appointment. Patrick [name] is out. Are you trying to set up patients now for hospital follow ups?

## 2019-01-19 NOTE — Telephone Encounter (Signed)
Referral to pulmonologist placed. 

## 2019-01-19 NOTE — Telephone Encounter (Signed)
Ok. Thanks for getting him scheduled.

## 2019-01-19 NOTE — Telephone Encounter (Signed)
Pt alreay schedule for HFU on 01-25-2019. Done

## 2019-01-19 NOTE — Telephone Encounter (Signed)
Will you please call pt and make sure he follows up with me. He had a pulmonary nodule found when he was hospitalized.  I want to refer him  to pulmonologist to get opinion on work up of this pet scan vs biopsy.

## 2019-01-19 NOTE — Telephone Encounter (Signed)
Sorry if I wasn't clear. I am letting you know that he does have a hospital f/u scheduled.

## 2019-01-19 NOTE — Telephone Encounter (Signed)
No answer. 1st attempt. Does have a hospital follow up scheduled already 01/25/19

## 2019-01-20 NOTE — Telephone Encounter (Signed)
No answer. 2nd attempt 

## 2019-01-22 DIAGNOSIS — J441 Chronic obstructive pulmonary disease with (acute) exacerbation: Secondary | ICD-10-CM | POA: Diagnosis not present

## 2019-01-25 ENCOUNTER — Ambulatory Visit (HOSPITAL_BASED_OUTPATIENT_CLINIC_OR_DEPARTMENT_OTHER)
Admission: RE | Admit: 2019-01-25 | Discharge: 2019-01-25 | Disposition: A | Discharge status: Home / Self Care | Payer: Medicare Other | Source: Ambulatory Visit | Attending: Medical | Admitting: Medical

## 2019-01-25 ENCOUNTER — Telehealth: Payer: Self-pay | Admitting: Medical

## 2019-01-25 ENCOUNTER — Encounter: Payer: Self-pay | Admitting: Medical

## 2019-01-25 ENCOUNTER — Ambulatory Visit (INDEPENDENT_AMBULATORY_CARE_PROVIDER_SITE_OTHER): Payer: Medicare Other | Admitting: Medical

## 2019-01-25 VITALS — BP 94/55 | HR 93 | Temp 97.8°F | Resp 16 | Ht 68.0 in | Wt 130.4 lb

## 2019-01-25 DIAGNOSIS — R059 Cough, unspecified: Secondary | ICD-10-CM

## 2019-01-25 DIAGNOSIS — R05 Cough: Secondary | ICD-10-CM

## 2019-01-25 DIAGNOSIS — J441 Chronic obstructive pulmonary disease with (acute) exacerbation: Secondary | ICD-10-CM | POA: Diagnosis not present

## 2019-01-25 DIAGNOSIS — R911 Solitary pulmonary nodule: Secondary | ICD-10-CM

## 2019-01-25 DIAGNOSIS — E876 Hypokalemia: Secondary | ICD-10-CM | POA: Diagnosis not present

## 2019-01-25 DIAGNOSIS — J189 Pneumonia, unspecified organism: Secondary | ICD-10-CM

## 2019-01-25 MED ORDER — METFORMIN HCL 500 MG PO TABS: ORAL_TABLET | ORAL | 0 refills | Status: DC

## 2019-01-25 MED ORDER — BUDESONIDE-FORMOTEROL FUMARATE 80-4.5 MCG/ACT IN AERO: 2.0000 | INHALATION_SPRAY | Freq: Two times a day (BID) | RESPIRATORY_TRACT | 3 refills | Status: DC

## 2019-01-25 MED ORDER — LEVOFLOXACIN 500 MG PO TABS: 500.0000 mg | ORAL_TABLET | Freq: Every day | ORAL | 0 refills | Status: DC

## 2019-01-25 NOTE — Patient Instructions (Signed)
For your history of COPD, pneumonia and pulmonary nodule, I do want you to repeat a chest x-ray today.  He still reports some occasional cough.  We will see if any infectious findings on chest x-ray.  I want you to go ahead and try to call Dr. Morrison Old office and asked for follow-up appointment for pulmonary nodule within the next 10 days.  I am also going to try to find his number and explain the recent CT findings so we can get you relatively quick follow-up appointment.  If you get appointment scheduled please let me know.  Continue with the oxygen daily until you see Dr. Melvyn Novas pulmonologist.  Dennis Bast have a nebulizer machine to use if needed.  I did go ahead and send in a prescription of Symbicort to your pharmacy.  Would recommend that you use this daily.  Want to repeat CBC and metabolic panel.  Your potassium was low.  Your blood pressures been on the lower side recently and recommend that you continue to stay off of HCTZ.  We will follow your lab results and see if the metabolic panel indicates some dehydration.   Follow-up in 7 to 10 days or as needed.

## 2019-01-25 NOTE — Telephone Encounter (Signed)
Rx metformin and levofloxin sent to pt pharmacy.

## 2019-01-25 NOTE — Progress Notes (Signed)
Subjective:    Patient ID: Patrick [name], male    DOB: [birth year], 73 y.o.   MRN: [id number]  HPI  Pt in for follow up.  He stats he feels pretty good presently. He was was feeling tired and shortness and breath. He was sent home on oxygen.  He saw Patrick [name] in the past 2 years ago. In past he states could walk well and his 02 sat never dropped less than 94%.   Today during the exam seated his 02 sat is 95%. Today his machine ran out of battery. Pt states when he was hospitalized his temp was 99-100.2.   Dx and treated for pneumonia(with azithromycin and cefempime iv). Pt was dc'd and vantin. He finished antibiotic. He given nebulizer just once a day. He has plenty of solution. On review med list don't see symbicort.  Pt instructed to stop hctz.      CT report showed   1. No evidence of significant pulmonary embolus. 2. Diffuse emphysematous changes in the lungs. 3. 1.6 x 1.2 cm nodule in the left inferior upper lung corresponding to chest x-ray abnormality. This is indeterminate and could represent inflammatory or neoplastic process. Tiny adjacent peribronchovascular nodules are present. Prominent mediastinal lymph nodes are nonspecific in could be metastatic or inflammatory. Consider one of the following in 3 months for both low-risk and high-risk individuals: (a) referral to chest clinic, (b) follow-up PET-CT, or (c) tissue sampling. This recommendation follows the consensus statement: Guidelines for Management of Incidental Pulmonary Nodules Detected on CT Images: From the Fleischner Society 2017; Radiology 2017; 284:228-243. 4. Diffuse peribronchial thickening with opacification or mucous plugging in the lower lobe bronchi bilaterally, suggesting acute or chronic bronchitis or inflammatory airways disease. 5. Focal consolidation in the right lower lung likely pneumonia.   Review of Systems  Constitutional: Negative for chills, fatigue and fever.  HENT: Negative for  dental problem.   Respiratory: Positive for shortness of breath. Negative for chest tightness, wheezing and stridor.        Still some but better than when he was discharged,  Cardiovascular: Negative for chest pain and palpitations.  Gastrointestinal: Negative for abdominal pain.  Musculoskeletal: Negative for back pain.  Neurological: Negative for facial asymmetry and headaches.  Hematological: Negative for adenopathy. Does not bruise/bleed easily.  Psychiatric/Behavioral: Negative for behavioral problems and confusion.   Past Medical History:  Diagnosis Date  . CAD (coronary artery disease)   . COPD (chronic obstructive pulmonary disease) ([place])   . Hyperlipidemia   . Hypertension      Social History   Socioeconomic History  . Marital status: Single    Spouse name: Not on file  . Number of children: Not on file  . Years of education: Not on file  . Highest education level: Not on file  Occupational History  . Not on file  Social Needs  . Financial resource strain: Not on file  . Food insecurity:    Worry: Not on file    Inability: Not on file  . Transportation needs:    Medical: Not on file    Non-medical: Not on file  Tobacco Use  . Smoking status: Former Smoker    Packs/day: 1.25    Years: 39.00    Pack years: 48.75    Types: Cigarettes    Last attempt to quit: 12/31/2015    Years since quitting: 3.0  . Smokeless tobacco: Never Used  Substance and Sexual Activity  . Alcohol use: No  Alcohol/week: 0.0 standard drinks  . Drug use: No  . Sexual activity: Never  Lifestyle  . Physical activity:    Days per week: Not on file    Minutes per session: Not on file  . Stress: Not on file  Relationships  . Social connections:    Talks on phone: Not on file    Gets together: Not on file    Attends religious service: Not on file    Active member of club or organization: Not on file    Attends meetings of clubs or organizations: Not on file    Relationship status:  Not on file  . Intimate partner violence:    Fear of current or ex partner: Not on file    Emotionally abused: Not on file    Physically abused: Not on file    Forced sexual activity: Not on file  Other Topics Concern  . Not on file  Social History Narrative  . Not on file    Past Surgical History:  Procedure Laterality Date  . CARDIAC CATHETERIZATION N/A 12/30/2015   Procedure: Left Heart Cath and Coronary Angiography;  Surgeon: Adrian Prows, MD;  Location: [facility];  Service: Cardiovascular;  Laterality: N/A;  . EYE SURGERY     Bilateral cataract sx 2017    Family History  Problem Relation Age of Onset  . Cancer Mother   . Cancer Father   . Diabetes Sister   . Diabetes Brother     Allergies  Allergen Reactions  . Aspirin Swelling  . Cephalexin Itching    Tolerated Cefepime 12/2018  . Ibuprofen Swelling    Current Outpatient Medications on File Prior to Visit  Medication Sig Dispense Refill  . acetaminophen (TYLENOL) 325 MG tablet Take 2 tablets (650 mg total) by mouth every 6 (six) hours as needed for mild pain or fever (or Fever >/= 101).    Marland Kitchen albuterol (PROVENTIL HFA;VENTOLIN HFA) 108 (90 Base) MCG/ACT inhaler Inhale 2 puffs into the lungs every 6 (six) hours as needed for wheezing or shortness of breath. 1 Inhaler 2  . atorvastatin (LIPITOR) 40 MG tablet Take 40 mg by mouth daily.    . carvedilol (COREG) 6.25 MG tablet Take 6.25 mg by mouth 2 (two) times daily with a meal.  0  . cefpodoxime (VANTIN) 200 MG tablet Take 1 tablet (200 mg total) by mouth 2 (two) times daily. Patient tolerated cefepime during hospital stay 3 tablet 0  . cilostazol (PLETAL) 100 MG tablet Take 50 mg by mouth 2 (two) times daily.     . clopidogrel (PLAVIX) 75 MG tablet Take 75 mg by mouth daily.    Marland Kitchen dextromethorphan-guaiFENesin (MUCINEX DM) 30-600 MG 12hr tablet Take 1 tablet by mouth 2 (two) times daily. 40 tablet 0  . feeding supplement, ENSURE ENLIVE, (ENSURE ENLIVE) LIQD Take 237  mLs by mouth 2 (two) times daily between meals. 237 mL 12  . ipratropium-albuterol (DUONEB) 0.5-2.5 (3) MG/3ML SOLN Take 3 mLs by nebulization every 6 (six) hours as needed. He is rescheduled every 6 hours for next 2 days, then change to as needed 360 mL 0  . metFORMIN (GLUCOPHAGE) 500 MG tablet TAKE 1 TABLET BY MOUTH TWICE DAILY WITH A MEAL (Patient taking differently: Take 500 mg by mouth 2 (two) times daily. ) 60 tablet 0  . olmesartan (BENICAR) 40 MG tablet Take 40 mg by mouth daily.    . Vitamin D, Ergocalciferol, (DRISDOL) 50000 UNITS CAPS capsule Take 50,000 Units  by mouth every Monday.      No current facility-administered medications on file prior to visit.     BP (!) 94/55   Pulse 93   Temp 97.8 F (36.6 C) (Oral)   Resp 16   Ht 5\' 8"  (1.727 m)   Wt 130 lb 6.4 oz (59.1 kg)   SpO2 98%   BMI 19.83 kg/m       Objective:   Physical Exam   General  Mental Status - Alert. General Appearance - Well groomed. Not in acute distress.  Skin Rashes- No Rashes.  HEENT Head- Normal. Ear Auditory Canal - Left- Normal. Right - Normal.Tympanic Membrane- Left- Normal. Right- Normal. Eye Sclera/Conjunctiva- Left- Normal. Right- Normal. Nose & Sinuses Nasal Mucosa- Left-  Boggy and Congested. Right-  Boggy and  Congested.Bilateral  No maxillary and  No frontal sinus pressure. Mouth & Throat Lips: Upper Lip- Normal: no dryness, cracking, pallor, cyanosis, or vesicular eruption. Lower Lip-Normal: no dryness, cracking, pallor, cyanosis or vesicular eruption. Buccal Mucosa- Bilateral- No Aphthous ulcers. Oropharynx- No Discharge or Erythema. Tonsils: Characteristics- Bilateral- No Erythema or Congestion. Size/Enlargement- Bilateral- No enlargement. Discharge- bilateral-None.  Neck Neck- Supple. No Masses.   Chest and Lung Exam Auscultation: Breath Sounds:- even and unlabored.(mild shallow) but clear. Walking down hall on room air. o2 sat maintained  95%-93%.  Cardiovascular Auscultation:Rythm- Regular, rate and rhythm. Murmurs & Other Heart Sounds:Ausculatation of the heart reveal- No Murmurs.  Lymphatic Head & Neck General Head & Neck Lymphatics: Bilateral: Description- No Localized lymphadenopathy.      Assessment & Plan:  For your history of COPD, pneumonia and pulmonary nodule, I do want you to repeat a chest x-ray today.  He still reports some occasional cough.  We will see if any infectious findings on chest x-ray.  I want you to go ahead and try to call Dr. Morrison Old office and asked for follow-up appointment for pulmonary nodule within the next 10 days.  I am also going to try to find his number and explain the recent CT findings so we can get you relatively quick follow-up appointment.  If you get appointment scheduled please let me know.  Continue with the oxygen daily until you see Patrick [name] pulmonologist.  Dennis Bast have a nebulizer machine to use if needed.  I did go ahead and send in a prescription of Symbicort to your pharmacy.  Would recommend that you use this daily.  Want to repeat CBC and metabolic panel.  Your potassium was low.  Your blood pressures been on the lower side recently and recommend that you continue to stay off of HCTZ.  We will follow your lab results and see if the metabolic panel indicates some dehydration.   Follow-up in 7 to 10 days or as needed.

## 2019-01-26 LAB — CBC WITH DIFFERENTIAL/PLATELET
Basophils Absolute: 0.1 10*3/uL (ref 0.0–0.1)
Basophils Relative: 1.3 % (ref 0.0–3.0)
EOS ABS: 0.1 10*3/uL (ref 0.0–0.7)
Eosinophils Relative: 1.4 % (ref 0.0–5.0)
HCT: 39.4 % (ref 39.0–52.0)
Hemoglobin: 13.3 g/dL (ref 13.0–17.0)
Lymphocytes Relative: 16.3 % (ref 12.0–46.0)
Lymphs Abs: 1.3 10*3/uL (ref 0.7–4.0)
MCHC: 33.9 g/dL (ref 30.0–36.0)
MCV: 92.7 fl (ref 78.0–100.0)
Monocytes Absolute: 0.7 10*3/uL (ref 0.1–1.0)
Monocytes Relative: 9 % (ref 3.0–12.0)
Neutro Abs: 5.6 10*3/uL (ref 1.4–7.7)
Neutrophils Relative %: 72 % (ref 43.0–77.0)
Platelets: 340 10*3/uL (ref 150.0–400.0)
RBC: 4.25 Mil/uL (ref 4.22–5.81)
RDW: 13.3 % (ref 11.5–15.5)
WBC: 7.8 10*3/uL (ref 4.0–10.5)

## 2019-01-26 LAB — COMPREHENSIVE METABOLIC PANEL
ALT: 27 U/L (ref 0–53)
AST: 15 U/L (ref 0–37)
Albumin: 3.7 g/dL (ref 3.5–5.2)
Alkaline Phosphatase: 58 U/L (ref 39–117)
BUN: 19 mg/dL (ref 6–23)
CO2: 31 mEq/L (ref 19–32)
CREATININE: 0.8 mg/dL (ref 0.40–1.50)
Calcium: 9.7 mg/dL (ref 8.4–10.5)
Chloride: 101 mEq/L (ref 96–112)
GFR: 94.79 mL/min (ref >60.00)
Glucose, Bld: 195 mg/dL — ABNORMAL HIGH (ref 70–99)
Potassium: 4.2 mEq/L (ref 3.5–5.1)
Sodium: 139 mEq/L (ref 135–145)
TOTAL PROTEIN: 6.9 g/dL (ref 6.0–8.3)
Total Bilirubin: 0.4 mg/dL (ref 0.2–1.2)

## 2019-01-27 ENCOUNTER — Telehealth: Payer: Self-pay | Admitting: Medical

## 2019-01-27 NOTE — Telephone Encounter (Signed)
I did call Dr.Wert office today to follow up on his pulmonologist referral.  Based on the CT findings had asked patient to call last week to get scheduled and plan to call back today to verify that he had appointment.  I talked with staff and did not have appointment yet.  I went over the referral information and expressed desire to get him in at the latest 10 to 14 days but sooner if possible so we can get appropriate work-up going forward.  Staff informed me that they would call him to make appointment.  I asked her to send me a message through epic could be updated on that appointment date.

## 2019-01-29 ENCOUNTER — Ambulatory Visit: Payer: Medicare Other

## 2019-01-29 ENCOUNTER — Ambulatory Visit (INDEPENDENT_AMBULATORY_CARE_PROVIDER_SITE_OTHER): Payer: Medicare Other | Admitting: Internal Medicine

## 2019-01-29 ENCOUNTER — Encounter: Payer: Self-pay | Admitting: Internal Medicine

## 2019-01-29 DIAGNOSIS — J181 Lobar pneumonia, unspecified organism: Secondary | ICD-10-CM | POA: Diagnosis not present

## 2019-01-29 DIAGNOSIS — J189 Pneumonia, unspecified organism: Secondary | ICD-10-CM

## 2019-01-29 DIAGNOSIS — J9601 Acute respiratory failure with hypoxia: Secondary | ICD-10-CM

## 2019-01-29 DIAGNOSIS — R918 Other nonspecific abnormal finding of lung field: Secondary | ICD-10-CM

## 2019-01-29 DIAGNOSIS — J449 Chronic obstructive pulmonary disease, unspecified: Secondary | ICD-10-CM

## 2019-01-29 DIAGNOSIS — R911 Solitary pulmonary nodule: Secondary | ICD-10-CM

## 2019-01-29 LAB — CBC WITH DIFFERENTIAL/PLATELET
Basophils Absolute: 0 10*3/uL (ref 0.0–0.1)
Basophils Relative: 0.5 % (ref 0.0–3.0)
Eosinophils Absolute: 0.1 10*3/uL (ref 0.0–0.7)
Eosinophils Relative: 1.5 % (ref 0.0–5.0)
HCT: 39.7 % (ref 39.0–52.0)
Hemoglobin: 13.5 g/dL (ref 13.0–17.0)
Lymphocytes Relative: 16.5 % (ref 12.0–46.0)
Lymphs Abs: 1.3 10*3/uL (ref 0.7–4.0)
MCHC: 34 g/dL (ref 30.0–36.0)
MCV: 92.6 fl (ref 78.0–100.0)
Monocytes Absolute: 0.7 10*3/uL (ref 0.1–1.0)
Monocytes Relative: 8.7 % (ref 3.0–12.0)
NEUTROS ABS: 5.7 10*3/uL (ref 1.4–7.7)
Neutrophils Relative %: 72.8 % (ref 43.0–77.0)
Platelets: 313 10*3/uL (ref 150.0–400.0)
RBC: 4.29 Mil/uL (ref 4.22–5.81)
RDW: 13.6 % (ref 11.5–15.5)
WBC: 7.8 10*3/uL (ref 4.0–10.5)

## 2019-01-29 LAB — SEDIMENTATION RATE: Sed Rate: 2 mm/hr (ref 0–20)

## 2019-01-29 NOTE — Progress Notes (Signed)
Subjective:     Patient ID: Patrick [name], male   DOB: Oct 24, 1946,    MRN: [id number]    Brief patient profile:  68 yowm quit smoking on admit Dec 31 2015   Admit date: 12/30/2015 Discharge date: 01/04/2016  Discharge Diagnoses:  Acute COPD exacerbation  acute hypoxic respiratory failure  . Left lower lobe pneumonia . CAP (community acquired pneumonia) . Coronary atherosclerosis of native coronary artery . Essential hypertension . Acute systolic CHF (congestive heart failure) ([place]) with LVEDP 29 at cath 12/30/15  . Tobacco abuse  Consults: Cardiology  Recommendations for Outpatient Follow-up:  Please repeat CBC/BMET at next visit Please obtain chest x-ray in 2-3 weeks to ensure complete resolution of the pneumonia   History of Present Illness  01/30/2016 1st [facility] office visit/ Patrick [name]   Chief Complaint  Patient presents with  . HFU    Pt states that his breathing has improved, but not back to baseline. He has occ cough- sometimes prod with clear sputum. He has had occ chest tightness.   last feeling good in Nov 2016 > able to do push mower, also limited by pain R better since bypass but still with exertion  stopped altace 2 weeks prior to OV  p bp too low per Dr Nadyne Coombes does not know what he replaced it with if anything and very confused with details of care but using multiple different when at baseline didn't need any / still feeling "throat and chest congestion" but can't cough anything up to clear it -grabs in neck at sternal notch to show where feels mucus is stuck rec Change nebulizer to where you take the duoneb (ipatropium/albuterol) up to every 6 hours only if needed (stop all other inhalers/ nebs) Call us back with any discrepancies on your med list today  Please remember to go to the lab and x-ray department downstairs for your tests - we will call you with the results when they are available. GERD diet     03/08/2016  f/u ov/Patrick [name] re: COPD GOLD III s/p  smoking 12/2015 no main or prns Chief Complaint  Patient presents with  . Follow-up    PFT done today. Breathing is doing well. His cough has improved. No new co's.   Not limited by breathing from desired activities   rec You barely have a GOLD III copd, should not progress unless you resume smoking  F/u prn     Admission date:  01/12/2019    Discharge Date:  01/18/2019    Admission Diagnosis  Cough [R05] Hypoxemia [R09.02] Community acquired pneumonia of right lower lobe of lung ([place]) [J18.1] CAP (community acquired pneumonia) [J18.9]   Discharge Diagnosis  Cough [R05] Hypoxemia [R09.02] Community acquired pneumonia of right lower lobe of lung ([place]) [J18.1] CAP (community acquired pneumonia) [J18.9]    Principal Problem:   CAP (community acquired pneumonia)    HLD (hyperlipidemia)   PAD (peripheral artery disease) ([place])   Sepsis ([place])   Acute respiratory failure with hypoxia ([place])   Elevated troponin   Pulmonary nodule   Protein-calorie malnutrition, severe     HPI  from the history and physical done on the day of admission  Patrick [name] a 73 y.o.malewith medical history significant ofCAD, COPD, hypertension, hyperlipidemia presenting to the hospital complaining of cough and dyspnea on exertion. Patient reports having a 3 to 4-day history of minimally productive cough and dyspnea on exertion. States symptoms have been getting progressively worse. Reports having low-grade fevers and fatigue. Denies  having any chest pain or wheezing. Denies having any abdominal pain, nausea, vomiting, or diarrhea. States he is a former 1 pack/day x >35-year smoker, quit in 2017.   Hospital Course  Acute hypoxic respiratory failure/sepsis secondary to community-acquired pneumonia -Sepsis present on admission, tachypneic, tachycardic, with leukocytosis and respiratory failure.  -Influenza panel is negative, pneumonia antigen is negative as well, cultures are negative  at time of discharge as well. -CTA chest negative for PE, but with evidence of right lower lobe pneumonia and bronchitis, he was treated with IV azithromycin and cefepime during hospital stay, and his total of 7 days treatment given his fragility, and intermittent fever, he will be discharged on azithromycin and Vantin(tolerated cefepime during hospital stay). -CTA negative for PE but shows evidence of right lower lobe pneumonia and bronchitis -He is hypoxic at time of discharge, qualified for oxygen, 2 L nasal cannula -Encouraged to use flutter valve, incentive spirometry, he is on Mucinex  Troponinemia Troponin 0.04>0.03. EKG not suggestive of ACS.  -suspect mild troponin elevation likely secondary to demand ischemia.  Pulmonary nodule -1.6 x 1.2 cm nodule in the left inferior upper lung thought to be inflammatory versus neoplastic process. There is also evidence of prominent mediastinal lymph nodes thought to be inflammatory versus metastatic. Patient is a former smoker with a35+ pack year smoking history.  Microscopic hematuria UA with evidence of microscopic hematuria. -Patient is a former smoker. He will need outpatient follow-up, I have discussed with the patient  Hyperlipidemia -Continue Lipitor  Peripheral arterial disease -Continue home cilostazol, Plavix  Hypokalemia -Repleted.  Leukocytosis -trend  COPD -continue home meds       01/29/2019  f/u ov/Patrick [name] re:  Pulmonary infiltrates in pt with copd GOLD III Chief Complaint  Patient presents with  . Consult    Breathing is overall doing well. He had PNA a few wks ago and was told he needed f/u on his last cxr.  He is only using his duoneb once per day and no other inhaled meds.    Dyspnea:  Can do ht on vs off 02 about the same = MMRC2 = can't walk a nl pace on a flat grade s sob but does fine slow and flat  Cough: some am congestion but nothing purulent or bloody coming up  Sleeping: on back one pillow   SABA use: duoneb in am and prn rest of the day  02: 2lpm at bedtime and prn daytime     No obvious day to day or daytime variability or assoc excess/ purulent sputum or mucus plugs or hemoptysis or cp or chest tightness, subjective wheeze or overt sinus or hb symptoms.   Sleeping  without nocturnal   exacerbation  of respiratory  c/o's or need for noct saba. Also denies any obvious fluctuation of symptoms with weather or environmental changes or other aggravating or alleviating factors except as outlined above   No unusual exposure hx or h/o childhood pna/ asthma or knowledge of premature birth.  Current Allergies, Complete Past Medical History, Past Surgical History, Family History, and Social History were reviewed in [employer] record.   ROS  The following are not active complaints unless bolded Hoarseness, sore throat, dysphagia, dental problems, itching, sneezing,  nasal congestion or discharge of excess mucus or purulent secretions, ear ache,   fever, chills, sweats, unintended wt loss or wt gain, classically pleuritic or exertional cp,  orthopnea pnd or arm/hand swelling  or leg swelling, presyncope, palpitations, abdominal pain, anorexia, nausea,  vomiting, diarrhea  or change in bowel habits or change in bladder habits, change in stools or change in urine, dysuria, hematuria,  rash, arthralgias, visual complaints, headache, numbness, weakness or ataxia or problems with walking or coordination,  change in mood or  memory.        Current Meds  Medication Sig  . acetaminophen (TYLENOL) 325 MG tablet Take 2 tablets (650 mg total) by mouth every 6 (six) hours as needed for mild pain or fever (or Fever >/= 101).  Marland Kitchen albuterol (PROVENTIL HFA;VENTOLIN HFA) 108 (90 Base) MCG/ACT inhaler Inhale 2 puffs into the lungs every 6 (six) hours as needed for wheezing or shortness of breath.  Marland Kitchen atorvastatin (LIPITOR) 40 MG tablet Take 40 mg by mouth daily.  . budesonide-formoterol  (SYMBICORT) 80-4.5 MCG/ACT inhaler Inhale 2 puffs into the lungs 2 (two) times daily.  . carvedilol (COREG) 6.25 MG tablet Take 6.25 mg by mouth 2 (two) times daily with a meal.  . cilostazol (PLETAL) 100 MG tablet Take 50 mg by mouth 2 (two) times daily.   . clopidogrel (PLAVIX) 75 MG tablet Take 75 mg by mouth daily.  Marland Kitchen dextromethorphan-guaiFENesin (MUCINEX DM) 30-600 MG 12hr tablet Take 1 tablet by mouth 2 (two) times daily.  . feeding supplement, ENSURE ENLIVE, (ENSURE ENLIVE) LIQD Take 237 mLs by mouth 2 (two) times daily between meals.  Marland Kitchen ipratropium-albuterol (DUONEB) 0.5-2.5 (3) MG/3ML SOLN Take 3 mLs by nebulization every 6 (six) hours as needed. He is rescheduled every 6 hours for next 2 days, then change to as needed  . levofloxacin (LEVAQUIN) 500 MG tablet Take 1 tablet (500 mg total) by mouth daily.  . metFORMIN (GLUCOPHAGE) 500 MG tablet TAKE 1 TABLET BY MOUTH TWICE DAILY WITH A MEAL  . olmesartan (BENICAR) 40 MG tablet Take 40 mg by mouth daily.  . OXYGEN 2lpm 24/7  . Vitamin D, Ergocalciferol, (DRISDOL) 50000 UNITS CAPS capsule Take 50,000 Units by mouth every Monday.           Objective:   Physical Exam   amb wm nad   01/29/2019       132  03/08/2016       141  01/30/16 135 lb 6.4 oz (61.417 kg)  01/04/16 129 lb 3.2 oz (58.605 kg)  08/24/15 134 lb 3.2 oz (60.873 kg)    Vital signs reviewed - Note on arrival 02 sats  98% on RA     HEENT: nl dentition / oropharynx. Nl external ear canals without cough reflex -  Mild bilateral non-specific turbinate edema     NECK :  without JVD/Nodes/TM/ nl carotid upstrokes bilaterally   LUNGS: no acc muscle use,  Mod barrel  contour chest wall with bilateral  Distant bs s audible wheeze and  without cough on insp or exp maneuver and mod  Hyperresonant  to  percussion bilaterally     CV:  RRR  no s3 or murmur or increase in P2, and no edema   ABD:  soft and nontender with pos mid insp Hoover's  in the supine position. No bruits  or organomegaly appreciated, bowel sounds nl  MS:   Nl gait/  ext warm without deformities, calf tenderness, cyanosis or clubbing No obvious joint restrictions   SKIN: warm and dry without lesions    NEURO:  alert, approp, nl sensorium with  no motor or cerebellar deficits apparent.       I personally reviewed images and agree with radiology impression as follows:  Chest CTa  01/12/2019 1. No evidence of significant pulmonary embolus. 2. Diffuse emphysematous changes in the lungs. 3. 1.6 x 1.2 cm nodule in the left inferior upper lung corresponding to chest x-ray abnormality. This is indeterminate and could represent inflammatory or neoplastic process. Tiny adjacent peribronchovascular nodules are present. Prominent mediastinal lymph nodes are nonspecific in could be metastatic or inflammatory. 4. Diffuse peribronchial thickening with opacification or mucous plugging in the lower lobe bronchi bilaterally, suggesting acute or chronic bronchitis or inflammatory airways disease. 5. Focal consolidation in the right lower lung likely pneumonia.       Labs ordered 01/29/2019   Quant TB/ crypto ag   Lab Results  Component Value Date   WBC 7.8 01/29/2019   HGB 13.5 01/29/2019   HCT 39.7 01/29/2019   MCV 92.6 01/29/2019   PLT 313.0 01/29/2019       EOS                                                              0.1                                     01/29/2019      Lab Results  Component Value Date   ESRSEDRATE 2 01/29/2019            Assessment:

## 2019-01-29 NOTE — Patient Instructions (Addendum)
Please remember to go to the lab department   for your tests - we will call you with the results when they are available.      Please schedule a follow up office visit in 2 weeks, sooner if needed  - needs spirometry on return

## 2019-01-30 ENCOUNTER — Encounter: Payer: Self-pay | Admitting: Internal Medicine

## 2019-01-30 DIAGNOSIS — R911 Solitary pulmonary nodule: Secondary | ICD-10-CM | POA: Insufficient documentation

## 2019-01-30 NOTE — Assessment & Plan Note (Signed)
See admit 01/12/19 - 01/18/19 - rx as of 01/29/2019 = 2lpm hs and prn daytime   Adequate control on present rx, reviewed in detail with pt > no change in rx needed

## 2019-01-30 NOTE — Assessment & Plan Note (Signed)
Present on cxr 01/12/18 but did not f/u as instructed by PCP - See CT chest = 1.6 x 1.2 cm nodule cystic component  LUL/ prob postapical segment - Quant Gold Tb plus 01/29/2019  - .crypto ag 01/29/2019    Definitely increasing over the last year and neoplasm is the most likely explanation but strongly doubt he's a surgical candidate so best option once we've addressed the CAP in the RLL and optimized his copd is navigational bx and localized RT if proved to be malignant (vs granulmatous, also high in ddx)   Discussed in detail all the  indications, usual  risks and alternatives  relative to the benefits with patient who agrees to proceed with w/u as outlined.      Total time devoted to counseling  > 50 % of initial consultation  review case (including inpt records)  with pt/  device teaching which extended face to face time for this visit discussion of options/alternatives/ personally creating written customized instructions  in presence of pt  then going over those specific  Instructions directly with the pt including how to use all of the meds but in particular covering each new medication in detail and the difference between the maintenance= "automatic" meds and the prns using an action plan format for the latter (If this problem/symptom => do that organization reading Left to right).  Please see AVS from this visit for a full list of these instructions which I personally wrote for this pt and  are unique to this visit.

## 2019-01-30 NOTE — Assessment & Plan Note (Signed)
Quit smoking 12/2015 - PFT's  03/08/2016  FEV1 1.42 (47 % ) ratio 49  p 8 % improvement from saba with DLCO  42 % corrects to 69 % for alv volume    He's been using duoneb but probably would be better served with lama/laba in terms of symptom control > will repeat spirometry in 2 weeks and consider trial then

## 2019-01-30 NOTE — Assessment & Plan Note (Signed)
See admit 01/12/19 -01/18/19  His symptoms and evolving cxr are c/w partially treated pna with low esr against organizing pna/ boop and since has underlying severe copd would finish present abx and unless there is a flare of symptoms just f/u conservatively with cxr in 2 weeks   Discussed in detail all the  indications, usual  risks and alternatives  relative to the benefits with patient who agrees to proceed with   rx as outlined

## 2019-01-31 LAB — QUANTIFERON-TB GOLD PLUS
Mitogen-NIL: 6.42 IU/mL
NIL: 0.02 IU/mL
QUANTIFERON-TB GOLD PLUS: NEGATIVE
TB1-NIL: 0 IU/mL
TB2-NIL: 0.01 IU/mL

## 2019-02-01 ENCOUNTER — Telehealth: Payer: Self-pay | Admitting: Medical

## 2019-02-01 LAB — CRYPTOCOCCAL ANTIGEN: Cryptococcus Antigen, Serum: NEGATIVE

## 2019-02-01 NOTE — Telephone Encounter (Signed)
Copied from [place] [id number]. Topic: General - Other >> Feb 01, 2019  2:34 PM Lennox Solders wrote: Reason for CRM: [facility] pharm tech with  [facility] is calling and pt is not due for a refill on  metformin until 02-25-2019. [facility] would like 90 day supply #[id number] to help improve his adherence score.

## 2019-02-02 MED ORDER — METFORMIN HCL 500 MG PO TABS: ORAL_TABLET | ORAL | 0 refills | Status: DC

## 2019-02-02 NOTE — Telephone Encounter (Signed)
Rx sent to pharmacy   

## 2019-02-12 ENCOUNTER — Encounter: Payer: Self-pay | Admitting: Internal Medicine

## 2019-02-12 ENCOUNTER — Ambulatory Visit (INDEPENDENT_AMBULATORY_CARE_PROVIDER_SITE_OTHER): Payer: Medicare Other | Admitting: Internal Medicine

## 2019-02-12 VITALS — BP 138/70 | HR 80 | Ht 68.0 in | Wt 132.4 lb

## 2019-02-12 DIAGNOSIS — J9601 Acute respiratory failure with hypoxia: Secondary | ICD-10-CM | POA: Diagnosis not present

## 2019-02-12 DIAGNOSIS — J449 Chronic obstructive pulmonary disease, unspecified: Secondary | ICD-10-CM

## 2019-02-12 DIAGNOSIS — R05 Cough: Secondary | ICD-10-CM

## 2019-02-12 DIAGNOSIS — R058 Other specified cough: Secondary | ICD-10-CM

## 2019-02-12 DIAGNOSIS — R911 Solitary pulmonary nodule: Secondary | ICD-10-CM | POA: Diagnosis not present

## 2019-02-12 NOTE — Patient Instructions (Addendum)
Please remember to go to the  x-ray department  for your tests - we will call you with the results when they are available   -  Ok to return next week if can't do today  - notes indicate hematuria needs f/u too  - late add:  Needs ct s contrast at 04/13/19 > placed in reminder file

## 2019-02-12 NOTE — Progress Notes (Signed)
Subjective:     Patient ID: Patrick [name], male   DOB: [birth year],    MRN: [id number]    Brief patient profile:  73 yowm quit smoking on admit Dec 31 2015 at admit:   Admit date: 12/30/2015 Discharge date: 01/04/2016  Discharge Diagnoses:  Acute COPD exacerbation  acute hypoxic respiratory failure  . Left lower lobe pneumonia . CAP (community acquired pneumonia) . Coronary atherosclerosis of native coronary artery . Essential hypertension . Acute systolic CHF (congestive heart failure) ([place]) with LVEDP 29 at cath 12/30/15  . Tobacco abuse  Consults: Cardiology     History of Present Illness  01/30/2016 1st [facility] office visit/ Patrick [name]   Chief Complaint  Patient presents with  . HFU    Pt states that his breathing has improved, but not back to baseline. He has occ cough- sometimes prod with clear sputum. He has had occ chest tightness.   last feeling good in Nov 2016 > able to do push mower, also limited by pain R better since bypass but still with exertion  stopped altace 2 weeks prior to OV  p bp too low per Patrick [name] does not know what he replaced it with if anything and very confused with details of care but using multiple different when at baseline didn't need any / still feeling "throat and chest congestion" but can't cough anything up to clear it -grabs in neck at sternal notch to show where feels mucus is stuck rec Change nebulizer to where you take the duoneb (ipatropium/albuterol) up to every 6 hours only if needed (stop all other inhalers/ nebs) Call us back with any discrepancies on your med list today  GERD diet     03/08/2016  f/u ov/Patrick [name] re: COPD GOLD III s/p smoking 12/2015 no main or prns Chief Complaint  Patient presents with  . Follow-up    PFT done today. Breathing is doing well. His cough has improved. No new co's.   Not limited by breathing from desired activities   rec You barely have a GOLD III copd, should not progress unless you resume  smoking  F/u prn     Admission date:  01/12/2019    Discharge Date:  01/18/2019    Admission Diagnosis  Cough [R05] Hypoxemia [R09.02] Community acquired pneumonia of right lower lobe of lung ([place]) [J18.1] CAP (community acquired pneumonia) [J18.9]   Discharge Diagnosis  Cough [R05] Hypoxemia [R09.02] Community acquired pneumonia of right lower lobe of lung ([place]) [J18.1] CAP (community acquired pneumonia) [J18.9]    Principal Problem:   CAP (community acquired pneumonia)    HLD (hyperlipidemia)   PAD (peripheral artery disease) ([place])   Sepsis ([place])   Acute respiratory failure with hypoxia ([place])   Elevated troponin   Pulmonary nodule   Protein-calorie malnutrition, severe     HPI  from the history and physical done on the day of admission  Patrick [name] a 73 y.o.malewith medical history significant ofCAD, COPD, hypertension, hyperlipidemia presenting to the hospital complaining of cough and dyspnea on exertion. Patient reports having a 3 to 4-day history of minimally productive cough and dyspnea on exertion. States symptoms have been getting progressively worse. Reports having low-grade fevers and fatigue. Denies having any chest pain or wheezing. Denies having any abdominal pain, nausea, vomiting, or diarrhea. States he is a former 1 pack/day x >35-year smoker, quit in 2017.   Hospital Course  Acute hypoxic respiratory failure/sepsis secondary to community-acquired pneumonia -Sepsis present on admission, tachypneic, tachycardic, with  leukocytosis and respiratory failure.  -Influenza panel is negative, pneumonia antigen is negative as well, cultures are negative at time of discharge as well. -CTA chest negative for PE, but with evidence of right lower lobe pneumonia and bronchitis, he was treated with IV azithromycin and cefepime during hospital stay, and his total of 7 days treatment given his fragility, and intermittent fever, he will be discharged on  azithromycin and Vantin(tolerated cefepime during hospital stay). -CTA negative for PE but shows evidence of right lower lobe pneumonia and bronchitis -He is hypoxic at time of discharge, qualified for oxygen, 2 L nasal cannula -Encouraged to use flutter valve, incentive spirometry, he is on Mucinex    Pulmonary nodule -1.6 x 1.2 cm nodule in the left inferior upper lung thought to be inflammatory versus neoplastic process. There is also evidence of prominent mediastinal lymph nodes thought to be inflammatory versus metastatic. Patient is a former smoker with a35+ pack year smoking history.   COPD -continue home meds       01/29/2019  f/u ov/Patrick [name] re:  Pulmonary infiltrates in pt with copd GOLD III Chief Complaint  Patient presents with  . Consult    Breathing is overall doing well. He had PNA a few wks ago and was told he needed f/u on his last cxr.  He is only using his duoneb once per day and no other inhaled meds.    Dyspnea:  Can do ht on vs off 02 about the same = MMRC2 = can't walk a nl pace on a flat grade s sob but does fine slow and flat  Cough: some am congestion but nothing purulent or bloody coming up  Sleeping: on back one pillow  SABA use: duoneb in am and prn rest of the day  02: 2lpm at bedtime and prn daytime   rec - needs spirometry on return     02/12/2019  f/u ov/Patrick [name] re: copd  ? GOLD IV  Chief Complaint  Patient presents with  . Follow-up    Breathing is overall doing well. He is using his albuterol neb once daily and rarely uses his albuterol inhaler.  Dyspnea:  MMRC2 = can't walk a nl pace on a flat grade s sob but does fine slow and flat off 02 now  Cough: none Sleeping: flat / two pillows  SABA use: no albuterol since one day prior to ov  02: prn    No obvious day to day or daytime variability or assoc excess/ purulent sputum or mucus plugs or hemoptysis or cp or chest tightness, subjective wheeze or overt sinus or hb symptoms.   Sleeping as  above  without nocturnal  or early am exacerbation  of respiratory  c/o's or need for noct saba. Also denies any obvious fluctuation of symptoms with weather or environmental changes or other aggravating or alleviating factors except as outlined above   No unusual exposure hx or h/o childhood pna/ asthma or knowledge of premature birth.  Current Allergies, Complete Past Medical History, Past Surgical History, Family History, and Social History were reviewed in [employer] record.  ROS  The following are not active complaints unless bolded Hoarseness, sore throat, dysphagia, dental problems, itching, sneezing,  nasal congestion or discharge of excess mucus or purulent secretions, ear ache,   fever, chills, sweats, unintended wt loss or wt gain, classically pleuritic or exertional cp,  orthopnea pnd or arm/hand swelling  or leg swelling, presyncope, palpitations, abdominal pain, anorexia, nausea, vomiting, diarrhea  or  change in bowel habits or change in bladder habits, change in stools or change in urine, dysuria, hematuria,  rash, arthralgias, visual complaints, headache, numbness, weakness or ataxia or problems with walking or coordination,  change in mood= anxious or  memory.        Current Meds  Medication Sig  . acetaminophen (TYLENOL) 325 MG tablet Take 2 tablets (650 mg total) by mouth every 6 (six) hours as needed for mild pain or fever (or Fever >/= 101).  Marland Kitchen atorvastatin (LIPITOR) 40 MG tablet Take 40 mg by mouth daily.  . carvedilol (COREG) 6.25 MG tablet Take 6.25 mg by mouth 2 (two) times daily with a meal.  . cilostazol (PLETAL) 100 MG tablet Take 50 mg by mouth 2 (two) times daily.   . clopidogrel (PLAVIX) 75 MG tablet Take 75 mg by mouth daily.  Marland Kitchen dextromethorphan-guaiFENesin (MUCINEX DM) 30-600 MG 12hr tablet Take 1 tablet by mouth 2 (two) times daily.  . feeding supplement, ENSURE ENLIVE, (ENSURE ENLIVE) LIQD Take 237 mLs by mouth 2 (two) times daily between  meals.  Marland Kitchen ipratropium-albuterol (DUONEB) 0.5-2.5 (3) MG/3ML SOLN Take 3 mLs by nebulization every 6 (six) hours as needed. He is rescheduled every 6 hours for next 2 days, then change to as needed  . metFORMIN (GLUCOPHAGE) 500 MG tablet TAKE 1 TABLET BY MOUTH TWICE DAILY WITH A MEAL  . olmesartan (BENICAR) 40 MG tablet Take 40 mg by mouth daily.  . OXYGEN 2lpm 24/7  . Vitamin D, Ergocalciferol, (DRISDOL) 50000 UNITS CAPS capsule Take 50,000 Units by mouth every Monday.                    Objective:   Physical Exam   Thin somber amb wm nad   02/12/2019       132  01/29/2019       132  03/08/2016       141  01/30/16 135 lb 6.4 oz (61.417 kg)  01/04/16 129 lb 3.2 oz (58.605 kg)  08/24/15 134 lb 3.2 oz (60.873 kg)     Vital signs reviewed - Note on arrival 02 sats  96% on RA         HEENT: nl dentition / oropharynx. Nl external ear canals without cough reflex -  Mild bilateral non-specific turbinate edema     NECK :  without JVD/Nodes/TM/ nl carotid upstrokes bilaterally   LUNGS: no acc muscle use,  Mod barrel  contour chest wall with bilateral  Distant bs s audible wheeze and  without cough on insp or exp maneuver and mod  Hyperresonant  to  percussion bilaterally     CV:  RRR  no s3 or murmur or increase in P2, and no edema   ABD:  soft and nontender with pos mid  insp Hoover's  in the supine position. No bruits or organomegaly appreciated, bowel sounds nl  MS:   Nl gait/  ext warm without deformities, calf tenderness, cyanosis or clubbing No obvious joint restrictions   SKIN: warm and dry without lesions    NEURO:  alert, approp, nl sensorium with  no motor or cerebellar deficits apparent.            CXR PA and Lateral:   02/12/2019 :    I personally reviewed images and agree with radiology impression as follows:   Near complete resolution of right lower lobe airspace consolidation with minimal residual peribronchial thickening.  Persistent left upper lobe soft  tissue nodule. Please  follow-up the recommendations from chest CT dated 01/12/2019, as primary bronchogenic malignancy can not be excluded.                 Assessment:

## 2019-02-13 NOTE — Assessment & Plan Note (Signed)
See admit 01/12/19 - 01/18/19 - rx as of 01/29/2019 = 2lpm hs and prn daytime  - - 02/12/2019   Walked RA  2 laps @  approx 232ft each @ fast pace  stopped due to  End of study, no sob or desats so d/c'd home 02

## 2019-02-13 NOTE — Assessment & Plan Note (Signed)
Quit smoking 12/2015 - PFT's  03/08/2016  FEV1 1.42 (47 % ) ratio 49  p 8 % improvement from saba with DLCO  42 % corrects to 69 % for alv volume   - Spirometry 02/12/2019  FEV1 0.8 (26%)  Ratio 0.44 with classic curvature s prior rx   - 02/12/2019   Walked RA  2 laps @  approx 234ft each @ fast pace  stopped due to  End of study, no sob or desats    His pft's don't match his symptoms which appear to be accurately reported based on today's walking study so since not having aecopd episodes and minimal symptoms no need for lama or lama/laba but if either apply then either would be option  here.

## 2019-02-14 ENCOUNTER — Observation Stay (HOSPITAL_COMMUNITY)
Admission: EM | Admit: 2019-02-14 | Discharge: 2019-02-16 | Disposition: A | Discharge status: Home / Self Care | Payer: Medicare Other | Attending: Internal Medicine | Admitting: Internal Medicine

## 2019-02-14 ENCOUNTER — Encounter (HOSPITAL_COMMUNITY): Payer: Self-pay | Admitting: Internal Medicine

## 2019-02-14 DIAGNOSIS — I110 Hypertensive heart disease with heart failure: Secondary | ICD-10-CM | POA: Insufficient documentation

## 2019-02-14 DIAGNOSIS — R911 Solitary pulmonary nodule: Secondary | ICD-10-CM | POA: Diagnosis present

## 2019-02-14 DIAGNOSIS — J449 Chronic obstructive pulmonary disease, unspecified: Secondary | ICD-10-CM | POA: Diagnosis not present

## 2019-02-14 DIAGNOSIS — K625 Hemorrhage of anus and rectum: Principal | ICD-10-CM | POA: Diagnosis present

## 2019-02-14 DIAGNOSIS — I10 Essential (primary) hypertension: Secondary | ICD-10-CM | POA: Diagnosis present

## 2019-02-14 DIAGNOSIS — Z800 Family history of malignant neoplasm of digestive organs: Secondary | ICD-10-CM | POA: Diagnosis not present

## 2019-02-14 DIAGNOSIS — Z87891 Personal history of nicotine dependence: Secondary | ICD-10-CM | POA: Insufficient documentation

## 2019-02-14 DIAGNOSIS — I2510 Atherosclerotic heart disease of native coronary artery without angina pectoris: Secondary | ICD-10-CM | POA: Insufficient documentation

## 2019-02-14 DIAGNOSIS — D696 Thrombocytopenia, unspecified: Secondary | ICD-10-CM | POA: Insufficient documentation

## 2019-02-14 DIAGNOSIS — I5020 Unspecified systolic (congestive) heart failure: Secondary | ICD-10-CM | POA: Diagnosis not present

## 2019-02-14 DIAGNOSIS — K5730 Diverticulosis of large intestine without perforation or abscess without bleeding: Secondary | ICD-10-CM | POA: Insufficient documentation

## 2019-02-14 DIAGNOSIS — E1151 Type 2 diabetes mellitus with diabetic peripheral angiopathy without gangrene: Secondary | ICD-10-CM | POA: Diagnosis not present

## 2019-02-14 DIAGNOSIS — E785 Hyperlipidemia, unspecified: Secondary | ICD-10-CM | POA: Insufficient documentation

## 2019-02-14 DIAGNOSIS — D62 Acute posthemorrhagic anemia: Secondary | ICD-10-CM | POA: Diagnosis present

## 2019-02-14 DIAGNOSIS — R197 Diarrhea, unspecified: Secondary | ICD-10-CM | POA: Diagnosis not present

## 2019-02-14 DIAGNOSIS — K922 Gastrointestinal hemorrhage, unspecified: Secondary | ICD-10-CM

## 2019-02-14 DIAGNOSIS — Z7902 Long term (current) use of antithrombotics/antiplatelets: Secondary | ICD-10-CM | POA: Diagnosis not present

## 2019-02-14 DIAGNOSIS — Z7984 Long term (current) use of oral hypoglycemic drugs: Secondary | ICD-10-CM | POA: Insufficient documentation

## 2019-02-14 DIAGNOSIS — I739 Peripheral vascular disease, unspecified: Secondary | ICD-10-CM | POA: Diagnosis present

## 2019-02-14 DIAGNOSIS — K219 Gastro-esophageal reflux disease without esophagitis: Secondary | ICD-10-CM | POA: Diagnosis not present

## 2019-02-14 DIAGNOSIS — Z79899 Other long term (current) drug therapy: Secondary | ICD-10-CM | POA: Insufficient documentation

## 2019-02-14 LAB — CBC WITH DIFFERENTIAL/PLATELET
Abs Immature Granulocytes: 0.02 10*3/uL (ref 0.00–0.07)
BASOS ABS: 0 10*3/uL (ref 0.0–0.1)
Basophils Relative: 1 %
Eosinophils Absolute: 0.4 10*3/uL (ref 0.0–0.5)
Eosinophils Relative: 5 %
HCT: 38.7 % — ABNORMAL LOW (ref 39.0–52.0)
Hemoglobin: 12.8 g/dL — ABNORMAL LOW (ref 13.0–17.0)
Immature Granulocytes: 0 %
LYMPHS PCT: 22 %
Lymphs Abs: 1.4 10*3/uL (ref 0.7–4.0)
MCH: 30.5 pg (ref 26.0–34.0)
MCHC: 33.1 g/dL (ref 30.0–36.0)
MCV: 92.4 fL (ref 80.0–100.0)
Monocytes Absolute: 0.6 10*3/uL (ref 0.1–1.0)
Monocytes Relative: 10 %
Neutro Abs: 4 10*3/uL (ref 1.7–7.7)
Neutrophils Relative %: 62 %
Platelets: 156 10*3/uL (ref 150–400)
RBC: 4.19 MIL/uL — ABNORMAL LOW (ref 4.22–5.81)
RDW: 12.8 % (ref 11.5–15.5)
WBC: 6.5 10*3/uL (ref 4.0–10.5)
nRBC: 0 % (ref 0.0–0.2)

## 2019-02-14 LAB — COMPREHENSIVE METABOLIC PANEL
ALT: 11 U/L (ref 0–44)
AST: 15 U/L (ref 15–41)
Albumin: 3.5 g/dL (ref 3.5–5.0)
Alkaline Phosphatase: 57 U/L (ref 38–126)
Anion gap: 10 (ref 5–15)
BUN: 9 mg/dL (ref 8–23)
CO2: 26 mmol/L (ref 22–32)
Calcium: 9.2 mg/dL (ref 8.9–10.3)
Chloride: 104 mmol/L (ref 98–111)
Creatinine, Ser: 0.78 mg/dL (ref 0.61–1.24)
GFR calc Af Amer: >60 mL/min (ref >60)
GFR calc non Af Amer: >60 mL/min (ref >60)
Glucose, Bld: 125 mg/dL — ABNORMAL HIGH (ref 70–99)
Potassium: 4 mmol/L (ref 3.5–5.1)
SODIUM: 140 mmol/L (ref 135–145)
Total Bilirubin: 0.9 mg/dL (ref 0.3–1.2)
Total Protein: 6.5 g/dL (ref 6.5–8.1)

## 2019-02-14 LAB — TYPE AND SCREEN
ABO/RH(D): A POS
Antibody Screen: NEGATIVE

## 2019-02-14 LAB — POC OCCULT BLOOD, ED: Fecal Occult Bld: POSITIVE — AB

## 2019-02-14 LAB — CBC
HCT: 34.9 % — ABNORMAL LOW (ref 39.0–52.0)
Hemoglobin: 12 g/dL — ABNORMAL LOW (ref 13.0–17.0)
MCH: 31.2 pg (ref 26.0–34.0)
MCHC: 34.4 g/dL (ref 30.0–36.0)
MCV: 90.6 fL (ref 80.0–100.0)
NRBC: 0 % (ref 0.0–0.2)
Platelets: 141 10*3/uL — ABNORMAL LOW (ref 150–400)
RBC: 3.85 MIL/uL — ABNORMAL LOW (ref 4.22–5.81)
RDW: 13.1 % (ref 11.5–15.5)
WBC: 7.5 10*3/uL (ref 4.0–10.5)

## 2019-02-14 LAB — APTT: aPTT: 30 seconds (ref 24–36)

## 2019-02-14 LAB — GLUCOSE, CAPILLARY: Glucose-Capillary: 72 mg/dL (ref 70–99)

## 2019-02-14 LAB — PROTIME-INR
INR: 1.06
Prothrombin Time: 13.7 seconds (ref 11.4–15.2)

## 2019-02-14 MED ORDER — CARVEDILOL 6.25 MG PO TABS
6.2500 mg | ORAL_TABLET | Freq: Two times a day (BID) | ORAL | Status: DC
  Administered 2019-02-15 – 2019-02-16 (×3): 6.25 mg via ORAL
  Filled 2019-02-14 (×3): qty 1

## 2019-02-14 MED ORDER — VITAMIN D (ERGOCALCIFEROL) 1.25 MG (50000 UNIT) PO CAPS
50000.0000 [IU] | ORAL_CAPSULE | ORAL | Status: DC
  Administered 2019-02-15: 50000 [IU] via ORAL
  Filled 2019-02-14: qty 1

## 2019-02-14 MED ORDER — SODIUM CHLORIDE 0.9 % IV SOLN
80.0000 mg | Freq: Once | INTRAVENOUS | Status: AC
  Administered 2019-02-14: 80 mg via INTRAVENOUS
  Filled 2019-02-14: qty 80

## 2019-02-14 MED ORDER — ACETAMINOPHEN 325 MG PO TABS
650.0000 mg | ORAL_TABLET | Freq: Four times a day (QID) | ORAL | Status: DC | PRN
  Administered 2019-02-16: 650 mg via ORAL
  Filled 2019-02-14: qty 2

## 2019-02-14 MED ORDER — ALBUTEROL SULFATE (2.5 MG/3ML) 0.083% IN NEBU: 3.0000 mL | INHALATION_SOLUTION | Freq: Four times a day (QID) | RESPIRATORY_TRACT | Status: DC | PRN

## 2019-02-14 MED ORDER — INSULIN ASPART 100 UNIT/ML ~~LOC~~ SOLN
0.0000 [IU] | Freq: Three times a day (TID) | SUBCUTANEOUS | Status: DC
  Administered 2019-02-15: 1 [IU] via SUBCUTANEOUS

## 2019-02-14 MED ORDER — SODIUM CHLORIDE 0.9 % IV SOLN
INTRAVENOUS | Status: DC
  Administered 2019-02-14 – 2019-02-15 (×3): via INTRAVENOUS

## 2019-02-14 MED ORDER — IRBESARTAN 300 MG PO TABS
300.0000 mg | ORAL_TABLET | Freq: Every day | ORAL | Status: DC
  Administered 2019-02-15 – 2019-02-16 (×2): 300 mg via ORAL
  Filled 2019-02-14 (×2): qty 1

## 2019-02-14 MED ORDER — ONDANSETRON HCL 4 MG PO TABS: 4.0000 mg | ORAL_TABLET | Freq: Four times a day (QID) | ORAL | Status: DC | PRN

## 2019-02-14 MED ORDER — SODIUM CHLORIDE 0.9 % IV SOLN
8.0000 mg/h | INTRAVENOUS | Status: DC
  Administered 2019-02-14 – 2019-02-15 (×2): 8 mg/h via INTRAVENOUS
  Filled 2019-02-14 (×5): qty 80

## 2019-02-14 MED ORDER — DM-GUAIFENESIN ER 30-600 MG PO TB12
1.0000 | ORAL_TABLET | Freq: Two times a day (BID) | ORAL | Status: DC
  Administered 2019-02-15 – 2019-02-16 (×3): 1 via ORAL
  Filled 2019-02-14 (×3): qty 1

## 2019-02-14 MED ORDER — PANTOPRAZOLE SODIUM 40 MG IV SOLR: 40.0000 mg | Freq: Two times a day (BID) | INTRAVENOUS | Status: DC

## 2019-02-14 MED ORDER — ATORVASTATIN CALCIUM 40 MG PO TABS
40.0000 mg | ORAL_TABLET | Freq: Every day | ORAL | Status: DC
  Administered 2019-02-15 – 2019-02-16 (×2): 40 mg via ORAL
  Filled 2019-02-14 (×2): qty 1

## 2019-02-14 MED ORDER — INSULIN ASPART 100 UNIT/ML ~~LOC~~ SOLN: 0.0000 [IU] | Freq: Every day | SUBCUTANEOUS | Status: DC

## 2019-02-14 MED ORDER — CILOSTAZOL 50 MG PO TABS
50.0000 mg | ORAL_TABLET | Freq: Two times a day (BID) | ORAL | Status: DC
  Administered 2019-02-15: 50 mg via ORAL
  Filled 2019-02-14 (×3): qty 1

## 2019-02-14 MED ORDER — ONDANSETRON HCL 4 MG/2ML IJ SOLN: 4.0000 mg | Freq: Four times a day (QID) | INTRAMUSCULAR | Status: DC | PRN

## 2019-02-14 NOTE — H&P (Signed)
History and Physical   LADARRELL CORNWALL JKK:[id number] DOB: Mar 10, 1946 DOA: 02/14/2019  Referring MD/NP/PA: Dr. Marye Round  PCP: Elise Benne   OutPatient specialist: Dr Melvyn Novas, Pulmonology   Patient coming from: Home  Chief Complaint: Rectal bleed  HPI: Patrick [name] is a 73 y.o. male with medical history significant of diverticular disease, coronary artery disease, systolic dysfunction CHF, hypertension, hyperlipidemia, tobacco abuse, COPD, who presents to the ER with rectal bleed.  Patient has been having painless bleed for the last 2 days..Denied any injury.  Denied any nausea or vomiting.Symptoms started with loose stools.  It persisted and now. He was seen by Dr Melvyn Novas yesterday.  Patient has done much better.  He has had 3 episodes of bloody stool in the morning and afternoon and 1 tonight.  So far no bleeding since he arrived the ER.  He is hemodynamically also stable.  Patient is being admitted for observation secondary to rectal bleed.  ED Course: Temperature is 98.5 blood pressure is 156/63, pulse is 91, respiratory rate of 24 oxygen sat 95% on room air.  White count is 6.5 hemoglobin 12.8 and platelets 156.  Rest of the lab work is normal.  Stool guaiac is positive x2.  PT 13.7 INR 1.07.  Review of Systems: As per HPI otherwise 10 point review of systems negative.    Past Medical History:  Diagnosis Date  . CAD (coronary artery disease)   . COPD (chronic obstructive pulmonary disease) ([place])   . Hyperlipidemia   . Hypertension     Past Surgical History:  Procedure Laterality Date  . CARDIAC CATHETERIZATION N/A 12/30/2015   Procedure: Left Heart Cath and Coronary Angiography;  Surgeon: Adrian Prows, MD;  Location: [facility];  Service: Cardiovascular;  Laterality: N/A;  . EYE SURGERY     Bilateral cataract sx 2017     reports that he quit smoking about 3 years ago. His smoking use included cigarettes. He has a 48.75 pack-year smoking history. He has never used  smokeless tobacco. He reports that he does not drink alcohol or use drugs.  Allergies  Allergen Reactions  . Aspirin Swelling  . Cephalexin Itching    Tolerated Cefepime 12/2018  . Ibuprofen Swelling    Family History  Problem Relation Age of Onset  . Cancer Mother   . Cancer Father   . Diabetes Sister   . Diabetes Brother      Prior to Admission medications   Medication Sig Start Date End Date Taking? Authorizing Provider  acetaminophen (TYLENOL) 325 MG tablet Take 2 tablets (650 mg total) by mouth every 6 (six) hours as needed for mild pain or fever (or Fever >/= 101). 01/18/19   Elgergawy, Silver Huguenin, MD  albuterol (PROVENTIL HFA;VENTOLIN HFA) 108 (90 Base) MCG/ACT inhaler Inhale 2 puffs into the lungs every 6 (six) hours as needed for wheezing or shortness of breath. Patient not taking: Reported on 02/12/2019 01/12/18   Saguier, Percell Miller, PA-C  atorvastatin (LIPITOR) 40 MG tablet Take 40 mg by mouth daily.    [provider]  carvedilol (COREG) 6.25 MG tablet Take 6.25 mg by mouth 2 (two) times daily with a meal. 12/27/16   [provider]  cilostazol (PLETAL) 100 MG tablet Take 50 mg by mouth 2 (two) times daily.     [provider]  clopidogrel (PLAVIX) 75 MG tablet Take 75 mg by mouth daily.    [provider]  dextromethorphan-guaiFENesin (MUCINEX DM) 30-600 MG 12hr tablet Take  1 tablet by mouth 2 (two) times daily. 01/04/16   Rai, Ripudeep K, MD  feeding supplement, ENSURE ENLIVE, (ENSURE ENLIVE) LIQD Take 237 mLs by mouth 2 (two) times daily between meals. 01/18/19   Elgergawy, Silver Huguenin, MD  ipratropium-albuterol (DUONEB) 0.5-2.5 (3) MG/3ML SOLN Take 3 mLs by nebulization every 6 (six) hours as needed. He is rescheduled every 6 hours for next 2 days, then change to as needed 01/18/19   Elgergawy, Silver Huguenin, MD  metFORMIN (GLUCOPHAGE) 500 MG tablet TAKE 1 TABLET BY MOUTH TWICE DAILY WITH A MEAL 02/02/19   Saguier, Percell Miller, PA-C  olmesartan (BENICAR) 40 MG  tablet Take 40 mg by mouth daily.    [provider]  OXYGEN 2lpm 24/7    [provider]  Vitamin D, Ergocalciferol, (DRISDOL) 50000 UNITS CAPS capsule Take 50,000 Units by mouth every Monday.     [provider]    Physical Exam: Vitals:   02/14/19 2145 02/14/19 2152 02/14/19 2209 02/14/19 2216  BP: 127/73   128/74  Pulse: 66   75  Resp: 17   18  Temp:  98.7 F (37.1 C)  98.5 F (36.9 C)  TempSrc:  Oral  Oral  SpO2: 95%   96%  Weight:   59 kg   Height:   5\' 9"  (1.753 m)       Constitutional: NAD, calm, comfortable Vitals:   02/14/19 2145 02/14/19 2152 02/14/19 2209 02/14/19 2216  BP: 127/73   128/74  Pulse: 66   75  Resp: 17   18  Temp:  98.7 F (37.1 C)  98.5 F (36.9 C)  TempSrc:  Oral  Oral  SpO2: 95%   96%  Weight:   59 kg   Height:   5\' 9"  (1.753 m)    Eyes: PERRL, lids and conjunctivae normal ENMT: Mucous membranes are moist. Posterior pharynx clear of any exudate or lesions.Normal dentition.  Neck: normal, supple, no masses, no thyromegaly Respiratory: clear to auscultation bilaterally, no wheezing, no crackles. Normal respiratory effort. No accessory muscle use.  Cardiovascular: Regular rate and rhythm, no murmurs / rubs / gallops. No extremity edema. 2+ pedal pulses. No carotid bruits.  Abdomen: no tenderness, no masses palpated. No hepatosplenomegaly. Bowel sounds positive.  Musculoskeletal: no clubbing / cyanosis. No joint deformity upper and lower extremities. Good ROM, no contractures. Normal muscle tone.  Skin: no rashes, lesions, ulcers. No induration Neurologic: CN 2-12 grossly intact. Sensation intact, DTR normal. Strength 5/5 in all 4.  Psychiatric: Normal judgment and insight. Alert and oriented x 3. Normal mood.     Labs on Admission: I have personally reviewed following labs and imaging studies  CBC: Recent Labs  Lab 02/14/19 1922  WBC 6.5  NEUTROABS 4.0  HGB 12.8*  HCT 38.7*  MCV 92.4  PLT 831   Basic  Metabolic Panel: Recent Labs  Lab 02/14/19 1922  NA 140  K 4.0  CL 104  CO2 26  GLUCOSE 125*  BUN 9  CREATININE 0.78  CALCIUM 9.2   GFR: Estimated Creatinine Clearance: 69.7 mL/min (by C-G formula based on SCr of 0.78 mg/dL). Liver Function Tests: Recent Labs  Lab 02/14/19 1922  AST 15  ALT 11  ALKPHOS 57  BILITOT 0.9  PROT 6.5  ALBUMIN 3.5   No results for input(s): LIPASE, AMYLASE in the last 168 hours. No results for input(s): AMMONIA in the last 168 hours. Coagulation Profile: Recent Labs  Lab 02/14/19 1922  INR 1.06   Cardiac  Enzymes: No results for input(s): CKTOTAL, CKMB, CKMBINDEX, TROPONINI in the last 168 hours. BNP (last 3 results) No results for input(s): PROBNP in the last 8760 hours. HbA1C: No results for input(s): HGBA1C in the last 72 hours. CBG: Recent Labs  Lab 02/14/19 2226  GLUCAP 72   Lipid Profile: No results for input(s): CHOL, HDL, LDLCALC, TRIG, CHOLHDL, LDLDIRECT in the last 72 hours. Thyroid Function Tests: No results for input(s): TSH, T4TOTAL, FREET4, T3FREE, THYROIDAB in the last 72 hours. Anemia Panel: No results for input(s): VITAMINB12, FOLATE, FERRITIN, TIBC, IRON, RETICCTPCT in the last 72 hours. Urine analysis:    Component Value Date/Time   COLORURINE AMBER (A) 01/12/2019 2142   APPEARANCEUR HAZY (A) 01/12/2019 2142   LABSPEC 1.034 (H) 01/12/2019 2142   PHURINE 5.0 01/12/2019 2142   GLUCOSEU NEGATIVE 01/12/2019 2142   HGBUR SMALL (A) 01/12/2019 2142   BILIRUBINUR NEGATIVE 01/12/2019 2142   [place] 01/12/2019 2142   PROTEINUR 100 (A) 01/12/2019 2142   NITRITE NEGATIVE 01/12/2019 2142   LEUKOCYTESUR NEGATIVE 01/12/2019 2142   Sepsis Labs: @LABRCNTIP (procalcitonin:4,lacticidven:4) )No results found for this or any previous visit (from the past 240 hour(s)).   Radiological Exams on Admission: No results found.  Assessment/Plan Principal Problem:   Rectal bleed Active Problems:   HLD  (hyperlipidemia)   Essential hypertension   Diverticulosis of colon   Coronary atherosclerosis of native coronary artery     #1.  Rectal bleed: Patient has had painless rectal bleed.  He had remote history of diverticulosis with colonoscopy 15 years ago.  We will admit him for observation.  Serial CBCs.  Monitor his bleeding.  Patient will ultimately need to have colonoscopy.  IV Protonix initiated.  Gastroenterology consulted for further decision.  #2 essential hypertension: Blood pressure so far is stable.  We will continue medications from home as tolerated.  #3 hyperlipidemia: Continue statin.  #4 coronary artery disease: No evidence of coronary artery decompensation.  Monitor patient closely and monitor H&H.  We need to be at least 10 g and above.  #5 COPD: No exacerbation.  Continue treatment.  DVT prophylaxis: SCD Code Status: Full code Family Communication: No family available Disposition Plan: Home Consults called: Dr. Lucio Edward, gastroenterology Admission status: Inpatient  Severity of Illness: The appropriate patient status for this patient is INPATIENT. Inpatient status is judged to be reasonable and necessary in order to provide the required intensity of service to ensure the patient's safety. The patient's presenting symptoms, physical exam findings, and initial radiographic and laboratory data in the context of their chronic comorbidities is felt to place them at high risk for further clinical deterioration. Furthermore, it is not anticipated that the patient will be medically stable for discharge from the hospital within 2 midnights of admission. The following factors support the patient status of inpatient.   " The patient's presenting symptoms include rectal bleed. " The worrisome physical exam findings include no significant findings on exam. " The initial radiographic and laboratory data are worrisome because of stable hemoglobin but guaiac positive stools and  to. " The chronic co-morbidities include COPD.   * I certify that at the point of admission it is my clinical judgment that the patient will require inpatient hospital care spanning beyond 2 midnights from the point of admission due to high intensity of service, high risk for further deterioration and high frequency of surveillance required.Barbette Merino MD [facility] Pager [phone]  If 7PM-7AM, please contact night-coverage [email or web address]  Password [email or web address]  02/14/2019, 10:57 PM

## 2019-02-14 NOTE — ED Provider Notes (Signed)
[facility] EMERGENCY DEPARTMENT Provider Note   CSN: [id number] Arrival date & time: 02/14/19  1836     History   Chief Complaint Chief Complaint  Patient presents with  . Rectal Bleeding  . Diarrhea    HPI Patrick [name] is a 73 y.o. male.  HPI Patient presented to the emergency room for evaluation of rectal bleeding.  Patient states the symptoms started yesterday with some loose stools that he described as diarrhea.  Today however he started noticing blood in his stool.  It is bright red.  He is not having any trouble with vomiting.  He denies any abdominal pain.  He denies any fevers.  Patient denies feeling lightheaded.  He has never had issues with prior episodes of rectal bleeding. Past Medical History:  Diagnosis Date  . CAD (coronary artery disease)   . COPD (chronic obstructive pulmonary disease) ([place])   . Hyperlipidemia   . Hypertension     Patient Active Problem List   Diagnosis Date Noted  . Solitary pulmonary nodule 01/30/2019  . Pulmonary infiltrates 01/29/2019  . Elevated troponin 01/13/2019  . Pulmonary nodule 01/13/2019  . Protein-calorie malnutrition, severe 01/13/2019  . Acute respiratory failure with hypoxia ([place]) 01/12/2019  . CAP (community acquired pneumonia) 01/12/2019  . Upper airway cough syndrome 01/30/2016  . COPD GOLD III copd  01/30/2016  . Shortness of breath 12/30/2015  . Demand ischemia ([place]) 12/30/2015  . Sepsis ([place]) 12/30/2015  . Influenza-like illness 12/30/2015  . Acute systolic CHF (congestive heart failure) ([place])   . Atherosclerotic peripheral vascular disease ([place]) 03/17/2014  . Occlusion and stenosis of carotid artery without mention of cerebral infarction 09/15/2013  . PAD (peripheral artery disease) ([place]) 09/15/2013  . Tobacco use disorder 09/10/2013  . Coronary atherosclerosis of native coronary artery 09/10/2013  . HLD (hyperlipidemia) 04/22/2008  . Essential hypertension 04/22/2008  . CARCINOMA,  BLADDER, HX OF 04/22/2008  . Diverticulosis of colon 07/20/2007    Past Surgical History:  Procedure Laterality Date  . CARDIAC CATHETERIZATION N/A 12/30/2015   Procedure: Left Heart Cath and Coronary Angiography;  Surgeon: Adrian Prows, MD;  Location: [facility];  Service: Cardiovascular;  Laterality: N/A;  . EYE SURGERY     Bilateral cataract sx 2017        Home Medications    Prior to Admission medications   Medication Sig Start Date End Date Taking? Authorizing Provider  acetaminophen (TYLENOL) 325 MG tablet Take 2 tablets (650 mg total) by mouth every 6 (six) hours as needed for mild pain or fever (or Fever >/= 101). 01/18/19   Elgergawy, Silver Huguenin, MD  albuterol (PROVENTIL HFA;VENTOLIN HFA) 108 (90 Base) MCG/ACT inhaler Inhale 2 puffs into the lungs every 6 (six) hours as needed for wheezing or shortness of breath. Patient not taking: Reported on 02/12/2019 01/12/18   Saguier, Percell Miller, PA-C  atorvastatin (LIPITOR) 40 MG tablet Take 40 mg by mouth daily.    [provider]  carvedilol (COREG) 6.25 MG tablet Take 6.25 mg by mouth 2 (two) times daily with a meal. 12/27/16   [provider]  cilostazol (PLETAL) 100 MG tablet Take 50 mg by mouth 2 (two) times daily.     [provider]  clopidogrel (PLAVIX) 75 MG tablet Take 75 mg by mouth daily.    [provider]  dextromethorphan-guaiFENesin (MUCINEX DM) 30-600 MG 12hr tablet Take 1 tablet by mouth 2 (two) times daily. 01/04/16   Mendel Corning, MD  feeding  supplement, ENSURE ENLIVE, (ENSURE ENLIVE) LIQD Take 237 mLs by mouth 2 (two) times daily between meals. 01/18/19   Elgergawy, Silver Huguenin, MD  ipratropium-albuterol (DUONEB) 0.5-2.5 (3) MG/3ML SOLN Take 3 mLs by nebulization every 6 (six) hours as needed. He is rescheduled every 6 hours for next 2 days, then change to as needed 01/18/19   Elgergawy, Silver Huguenin, MD  metFORMIN (GLUCOPHAGE) 500 MG tablet TAKE 1 TABLET BY MOUTH TWICE DAILY WITH A MEAL  02/02/19   Saguier, Percell Miller, PA-C  olmesartan (BENICAR) 40 MG tablet Take 40 mg by mouth daily.    [provider]  OXYGEN 2lpm 24/7    [provider]  Vitamin D, Ergocalciferol, (DRISDOL) 50000 UNITS CAPS capsule Take 50,000 Units by mouth every Monday.     [provider]    Family History Family History  Problem Relation Age of Onset  . Cancer Mother   . Cancer Father   . Diabetes Sister   . Diabetes Brother     Social History Social History   Tobacco Use  . Smoking status: Former Smoker    Packs/day: 1.25    Years: 39.00    Pack years: 48.75    Types: Cigarettes    Last attempt to quit: 12/31/2015    Years since quitting: 3.1  . Smokeless tobacco: Never Used  Substance Use Topics  . Alcohol use: No    Alcohol/week: 0.0 standard drinks  . Drug use: No     Allergies   Aspirin; Cephalexin; and Ibuprofen   Review of Systems Review of Systems  All other systems reviewed and are negative.    Physical Exam Updated Vital Signs BP 128/74   Pulse 68   Temp 98.5 F (36.9 C) (Oral)   Resp 20   SpO2 96%   Physical Exam Vitals signs and nursing note reviewed.  Constitutional:      General: He is not in acute distress.    Appearance: He is well-developed.  HENT:     Head: Normocephalic and atraumatic.     Right Ear: External ear normal.     Left Ear: External ear normal.  Eyes:     General: No scleral icterus.       Right eye: No discharge.        Left eye: No discharge.     Conjunctiva/sclera: Conjunctivae normal.  Neck:     Musculoskeletal: Neck supple.     Trachea: No tracheal deviation.  Cardiovascular:     Rate and Rhythm: Normal rate and regular rhythm.  Pulmonary:     Effort: Pulmonary effort is normal. No respiratory distress.     Breath sounds: Normal breath sounds. No stridor. No wheezing or rales.  Abdominal:     General: Bowel sounds are normal. There is no distension.     Palpations: Abdomen is soft.      Tenderness: There is no abdominal tenderness. There is no guarding or rebound.  Genitourinary:    Comments: Dark blood on rectal exam, no mass palpated Musculoskeletal:        General: No tenderness.  Skin:    General: Skin is warm and dry.     Findings: No rash.  Neurological:     Mental Status: He is alert.     Cranial Nerves: No cranial nerve deficit (no facial droop, extraocular movements intact, no slurred speech).     Sensory: No sensory deficit.     Motor: No abnormal muscle tone or seizure activity.  Coordination: Coordination normal.      ED Treatments / Results  Labs (all labs ordered are listed, but only abnormal results are displayed) Labs Reviewed  COMPREHENSIVE METABOLIC PANEL - Abnormal; Notable for the following components:      Result Value   Glucose, Bld 125 (*)    All other components within normal limits  CBC WITH DIFFERENTIAL/PLATELET - Abnormal; Notable for the following components:   RBC 4.19 (*)    Hemoglobin 12.8 (*)    HCT 38.7 (*)    All other components within normal limits  POC OCCULT BLOOD, ED - Abnormal; Notable for the following components:   Fecal Occult Bld POSITIVE (*)    All other components within normal limits  APTT  PROTIME-INR  TYPE AND SCREEN    EKG None  Radiology No results found.  Procedures Procedures (including critical care time)  Medications Ordered in ED Medications  0.9 %  sodium chloride infusion ( Intravenous New Bag/Given 02/14/19 1936)     Initial Impression / Assessment and Plan / ED Course  I have reviewed the triage vital signs and the nursing notes.  Pertinent labs & imaging results that were available during my care of the patient were reviewed by me and considered in my medical decision making (see chart for details).  Clinical Course as of Feb 14 2105  Nancy Fetter Feb 14, 2019  2056 D/w Dr Ardis Hughs.  Would have him stay overnight.  Monitor his blood.  Hold his plavix.   [JK]    Clinical Course User  Index [JK] Dorie Rank, MD   Patient presented to the emergency room for evaluation of rectal bleeding.  Patient is hemodynamically stable.  He does have gross blood rectal exam.  Discussed case with Dr. Ardis Hughs, gastroenterology.  He recommends overnight observation.  GI will see the patient in the morning.  Discussed the case with Dr. Jonelle Sidle.  He will plan on admission.  Final Clinical Impressions(s) / ED Diagnoses   Final diagnoses:  Acute GI bleeding      Dorie Rank, MD 02/14/19 2106

## 2019-02-14 NOTE — Progress Notes (Signed)
   02/14/19 2216  Vitals  Temp 98.5 F (36.9 C)  Temp Source Oral  BP 128/74  MAP (mmHg) 89  BP Location Right Arm  BP Method Automatic  Patient Position (if appropriate) Sitting  Pulse Rate 75  Resp 18  Oxygen Therapy  SpO2 96 %  O2 Device Room Air  MEWS Score  MEWS RR 0  MEWS Pulse 0  MEWS Systolic 0  MEWS LOC 0  MEWS Temp 0  MEWS Score 0  MEWS Score Color Green  Admitted pt to rm 3E23 from ED, pt alert and oriented, denied pain at this time, oriented to room, call bell placed within reach, placed on cardiac monitor, CCMD made aware.

## 2019-02-14 NOTE — ED Notes (Signed)
ED Provider at bedside. 

## 2019-02-14 NOTE — ED Triage Notes (Signed)
Pt arrives POV from home c/o rectal bleeding and diarrhea. Pt states bleeding is "bright red blood." Pt denies n/v. Pt reports having diarrhea yesterday and rectal bleeding starting today. Pt states he takes plavix. A&o x4 , VSS at this time.

## 2019-02-15 ENCOUNTER — Other Ambulatory Visit: Payer: Self-pay

## 2019-02-15 ENCOUNTER — Encounter (HOSPITAL_COMMUNITY): Payer: Self-pay | Admitting: General Practice

## 2019-02-15 DIAGNOSIS — I10 Essential (primary) hypertension: Secondary | ICD-10-CM

## 2019-02-15 DIAGNOSIS — E785 Hyperlipidemia, unspecified: Secondary | ICD-10-CM

## 2019-02-15 DIAGNOSIS — K5730 Diverticulosis of large intestine without perforation or abscess without bleeding: Secondary | ICD-10-CM | POA: Diagnosis not present

## 2019-02-15 DIAGNOSIS — J449 Chronic obstructive pulmonary disease, unspecified: Secondary | ICD-10-CM | POA: Diagnosis not present

## 2019-02-15 DIAGNOSIS — I25119 Atherosclerotic heart disease of native coronary artery with unspecified angina pectoris: Secondary | ICD-10-CM | POA: Diagnosis not present

## 2019-02-15 DIAGNOSIS — K921 Melena: Secondary | ICD-10-CM

## 2019-02-15 DIAGNOSIS — D62 Acute posthemorrhagic anemia: Secondary | ICD-10-CM | POA: Diagnosis not present

## 2019-02-15 DIAGNOSIS — Z7902 Long term (current) use of antithrombotics/antiplatelets: Secondary | ICD-10-CM | POA: Diagnosis not present

## 2019-02-15 DIAGNOSIS — R197 Diarrhea, unspecified: Secondary | ICD-10-CM

## 2019-02-15 DIAGNOSIS — K625 Hemorrhage of anus and rectum: Secondary | ICD-10-CM | POA: Diagnosis not present

## 2019-02-15 LAB — COMPREHENSIVE METABOLIC PANEL
ALK PHOS: 41 U/L (ref 38–126)
ALT: 10 U/L (ref 0–44)
AST: 12 U/L — ABNORMAL LOW (ref 15–41)
Albumin: 2.9 g/dL — ABNORMAL LOW (ref 3.5–5.0)
Anion gap: 10 (ref 5–15)
BUN: 7 mg/dL — ABNORMAL LOW (ref 8–23)
CALCIUM: 8.3 mg/dL — AB (ref 8.9–10.3)
CO2: 21 mmol/L — ABNORMAL LOW (ref 22–32)
CREATININE: 0.73 mg/dL (ref 0.61–1.24)
Chloride: 108 mmol/L (ref 98–111)
GFR calc Af Amer: >60 mL/min (ref >60)
GFR calc non Af Amer: >60 mL/min (ref >60)
Glucose, Bld: 89 mg/dL (ref 70–99)
Potassium: 3.5 mmol/L (ref 3.5–5.1)
Sodium: 139 mmol/L (ref 135–145)
Total Bilirubin: 0.7 mg/dL (ref 0.3–1.2)
Total Protein: 5.6 g/dL — ABNORMAL LOW (ref 6.5–8.1)

## 2019-02-15 LAB — GLUCOSE, CAPILLARY
Glucose-Capillary: 104 mg/dL — ABNORMAL HIGH (ref 70–99)
Glucose-Capillary: 101 mg/dL — ABNORMAL HIGH (ref 70–99)
Glucose-Capillary: 132 mg/dL — ABNORMAL HIGH (ref 70–99)
Glucose-Capillary: 82 mg/dL (ref 70–99)

## 2019-02-15 LAB — CBC
HCT: 34.2 % — ABNORMAL LOW (ref 39.0–52.0)
Hemoglobin: 11.4 g/dL — ABNORMAL LOW (ref 13.0–17.0)
MCH: 30.2 pg (ref 26.0–34.0)
MCHC: 33.3 g/dL (ref 30.0–36.0)
MCV: 90.5 fL (ref 80.0–100.0)
Platelets: 127 10*3/uL — ABNORMAL LOW (ref 150–400)
RBC: 3.78 MIL/uL — AB (ref 4.22–5.81)
RDW: 12.9 % (ref 11.5–15.5)
WBC: 6 10*3/uL (ref 4.0–10.5)
nRBC: 0 % (ref 0.0–0.2)

## 2019-02-15 MED ORDER — HYDRALAZINE HCL 20 MG/ML IJ SOLN: 10.0000 mg | INTRAMUSCULAR | Status: DC | PRN

## 2019-02-15 MED ORDER — PANTOPRAZOLE SODIUM 40 MG PO TBEC
40.0000 mg | DELAYED_RELEASE_TABLET | Freq: Every day | ORAL | Status: DC
  Administered 2019-02-15 – 2019-02-16 (×2): 40 mg via ORAL
  Filled 2019-02-15 (×2): qty 1

## 2019-02-15 NOTE — Progress Notes (Signed)
Progress Note    AMADOU KATZENSTEIN  NLZ:[id number] DOB: 1946-03-13  DOA: 02/14/2019 PCP: Mackie Pai, PA-C    Brief Narrative:   Chief complaint: Rectal bleed  Medical records reviewed and are as summarized below:  Patrick [name] is an 73 y.o. male with past medical history significant for diverticular disease, CAD, systolic CHF, HTN, HLD, tobacco abuse, and COPD; who presented with rectal bleeding for last 2 days.  Assessment/Plan:   Principal Problem:   Rectal bleed Active Problems:   HLD (hyperlipidemia)   Essential hypertension   Diverticulosis of colon   Coronary atherosclerosis of native coronary artery  Rectal bleeding with acute blood loss: Acute.  Patient presented with complaints of painless rectal bleeding.  He is on Plavix and Pletal and had recently been treated with a course of antibiotics for pneumonia.  Hemoglobin 12.8-> 12-> 11.4 g/dL today.  Patient still reporting some rectal bleeding.  Ostomy from 2008 noted diverticulosis.  Gastroenterology recommends likely need of outpatient colonoscopy. -Monitor overnight -Continue clear liquid diet -Recheck hemoglobin in a.m.  Essential hypertension: Blood pressures appear stable. -Continue Coreg as tolerated  Coronary artery disease. -Held Plavix and Pletal -Continue statin   Hyperlipidemia: -Continue atorvastatin  COPD, without exacerbation: -Continue breathing treatments as needed  Body mass index is 19.55 kg/m.   Family Communication/Anticipated D/C date and plan/Code Status   DVT prophylaxis: SCDs Code Status: Full Code.  Family Communication: No family present at bedside Disposition Plan: Possible discharge home tomorrow morning   Medical Consultants:    Gastroenterology Dr. Fuller Plan    Anti-Infectives:    None  Subjective:   Reports still having some mild bleeding this morning around 4 AM.  Denies any abdominal pain.  He notes a family history of colon cancer.  Objective:     Vitals:   02/15/19 0737 02/15/19 0846 02/15/19 1200 02/15/19 1650  BP: (!) 143/85 (!) 158/79 (!) 142/79 (!) 142/81  Pulse: 80 75 66 65  Resp: 16  17 16   Temp: 98 F (36.7 C)  98.5 F (36.9 C) 98.5 F (36.9 C)  TempSrc: Oral  Oral Oral  SpO2: 95%  97% 96%  Weight:      Height:        Intake/Output Summary (Last 24 hours) at 02/15/2019 1740 Last data filed at 02/15/2019 1600 Gross per 24 hour  Intake 2287.54 ml  Output 1375 ml  Net 912.54 ml   Filed Weights   02/14/19 2209 02/15/19 0627  Weight: 59 kg 60.1 kg    Exam: Constitutional: NAD, calm, comfortable Eyes: PERRL, lids and conjunctivae normal ENMT: Mucous membranes are moist. Posterior pharynx clear of any exudate or lesions.  Neck: normal, supple, no masses, no thyromegaly Respiratory: clear to auscultation bilaterally, no wheezing, no crackles. Normal respiratory effort. No accessory muscle use.  Cardiovascular: Regular rate and rhythm, no murmurs / rubs / gallops. No extremity edema. 2+ pedal pulses. No carotid bruits.  Abdomen: no tenderness, no masses palpated. No hepatosplenomegaly. Bowel sounds positive.  Musculoskeletal: no clubbing / cyanosis. No joint deformity upper and lower extremities. Good ROM, no contractures. Normal muscle tone.  Skin: no rashes, lesions, ulcers. No induration Neurologic: CN 2-12 grossly intact. Sensation intact, DTR normal. Strength 5/5 in all 4.  Psychiatric: Normal judgment and insight. Alert and oriented x 3. Normal mood.    Data Reviewed:   I have personally reviewed following labs and imaging studies:  Labs: Labs show the following:   Basic Metabolic Panel: Recent Labs  Lab 02/14/19 1922 02/15/19 0439  NA 140 139  K 4.0 3.5  CL 104 108  CO2 26 21*  GLUCOSE 125* 89  BUN 9 7*  CREATININE 0.78 0.73  CALCIUM 9.2 8.3*   GFR Estimated Creatinine Clearance: 71 mL/min (by C-G formula based on SCr of 0.73 mg/dL). Liver Function Tests: Recent Labs  Lab  02/14/19 1922 02/15/19 0439  AST 15 12*  ALT 11 10  ALKPHOS 57 41  BILITOT 0.9 0.7  PROT 6.5 5.6*  ALBUMIN 3.5 2.9*   No results for input(s): LIPASE, AMYLASE in the last 168 hours. No results for input(s): AMMONIA in the last 168 hours. Coagulation profile Recent Labs  Lab 02/14/19 1922  INR 1.06    CBC: Recent Labs  Lab 02/14/19 1922 02/14/19 2313 02/15/19 0439  WBC 6.5 7.5 6.0  NEUTROABS 4.0  --   --   HGB 12.8* 12.0* 11.4*  HCT 38.7* 34.9* 34.2*  MCV 92.4 90.6 90.5  PLT 156 141* 127*   Cardiac Enzymes: No results for input(s): CKTOTAL, CKMB, CKMBINDEX, TROPONINI in the last 168 hours. BNP (last 3 results) No results for input(s): PROBNP in the last 8760 hours. CBG: Recent Labs  Lab 02/14/19 2226 02/15/19 0855 02/15/19 1201 02/15/19 1653  GLUCAP 72 104* 101* 132*   D-Dimer: No results for input(s): DDIMER in the last 72 hours. Hgb A1c: No results for input(s): HGBA1C in the last 72 hours. Lipid Profile: No results for input(s): CHOL, HDL, LDLCALC, TRIG, CHOLHDL, LDLDIRECT in the last 72 hours. Thyroid function studies: No results for input(s): TSH, T4TOTAL, T3FREE, THYROIDAB in the last 72 hours.  Invalid input(s): FREET3 Anemia work up: No results for input(s): VITAMINB12, FOLATE, FERRITIN, TIBC, IRON, RETICCTPCT in the last 72 hours. Sepsis Labs: Recent Labs  Lab 02/14/19 1922 02/14/19 2313 02/15/19 0439  WBC 6.5 7.5 6.0    Microbiology No results found for this or any previous visit (from the past 240 hour(s)).  Procedures and diagnostic studies:  No results found.  Medications:   . atorvastatin  40 mg Oral Daily  . carvedilol  6.25 mg Oral BID WC  . cilostazol  50 mg Oral BID  . dextromethorphan-guaiFENesin  1 tablet Oral BID  . insulin aspart  0-5 Units Subcutaneous QHS  . insulin aspart  0-9 Units Subcutaneous TID WC  . irbesartan  300 mg Oral Daily  . [START ON 02/18/2019] pantoprazole  40 mg Intravenous Q12H  . Vitamin D  (Ergocalciferol)  50,000 Units Oral Q Mon   Continuous Infusions: . sodium chloride 75 mL/hr at 02/15/19 0852  . pantoprozole (PROTONIX) infusion 8 mg/hr (02/15/19 1004)     LOS: 0 days   Tyberius Ryner A Amaia Lavallie  [facility]   *Please refer to [email or web address], password [email or web address] to get updated schedule on who will round on this patient, as hospitalists switch teams weekly. If 7PM-7AM, please contact night-coverage at [email or web address], password [email or web address] for any overnight needs.

## 2019-02-15 NOTE — Progress Notes (Signed)
Unable to send stool specimen for GI Panel.Pt. had formed stool.

## 2019-02-15 NOTE — Consult Note (Addendum)
[facility] Consult: 8:14 AM 02/15/2019  LOS: 0 days    Referring Provider: Dr Tamala Julian  Primary Care Physician:  Mackie Pai, PA-C Primary Gastroenterologist:  Dr. Verl Blalock    Reason for Consultation:     HPI: Patrick [name] is a 73 y.o. male.  PMH COPD, emphysema.  NIDDM.  CAD. Cardiac stent 2007.  STEMI 2016.    EF 40% in 2011.  PVD, bil LE.   Htn.  DDD, DJD, spinal stenosis.    On Plavix, Pletal.   2008 Colonoscopy.  Screening study.  Diverticulosis.    Admission mid 12/2018 with RLL PNA/COPD flare.  Discharged on Vantin, azithromycin.   Suggestion for outpt PET-CT or biopsy to eval indeterminate lung nodules, mediastinal adenopathy on CT 01/12/19.  Also noted micro hematuria and severe malnutrition.    2 days ago, Saturday, he developed loose stool, this was not frequent or abundant.  Yesterday he had 3 bloody bowel movements at home.  1 of these was larger, the others were moderate volume.  His fourth episode of rectal bleeding was quite small after coming to the hospital yesterday evening.  He never felt dizzy, nauseated.  No abdominal pain.  No change in appetite or bowel habits.  Took his Plavix yesterday.  Denies issues with other unusual bleeding but does bruise, develop purpura easily.  Has been told he has GERD based on his hoarse voice and excessive phlegm.  No heartburn, indigestion and does not take PPI.  Appetite stable.  Over 3 to 4 years has dropped to 30 #not associated with dieting.   does not use aspirin or NSAIDs as they have caused swelling and itching in the past. At the hospital he is not been hypotensive or tachycardic.  Hgb 13.3 on 1/27>> 12.8 2/16 >> 11.4 this AM.   Platelets 127, previously nml.   PT/INR nml.   BUN not elevated.  LFTs nml.   K 2.9.    Family history  positive for colon cancer in his father around age 54.    Past Medical History:  Diagnosis Date  . CAD (coronary artery disease)   . COPD (chronic obstructive pulmonary disease) ([place])   . Hyperlipidemia   . Hypertension     Past Surgical History:  Procedure Laterality Date  . CARDIAC CATHETERIZATION N/A 12/30/2015   Procedure: Left Heart Cath and Coronary Angiography;  Surgeon: Adrian Prows, MD;  Location: [facility];  Service: Cardiovascular;  Laterality: N/A;  . EYE SURGERY     Bilateral cataract sx 2017    Prior to Admission medications   Medication Sig Start Date End Date Taking? Authorizing Provider  acetaminophen (TYLENOL) 325 MG tablet Take 2 tablets (650 mg total) by mouth every 6 (six) hours as needed for mild pain or fever (or Fever >/= 101). 01/18/19  Yes Elgergawy, Silver Huguenin, MD  albuterol (PROVENTIL HFA;VENTOLIN HFA) 108 (90 Base) MCG/ACT inhaler Inhale 2 puffs into the lungs every 6 (six) hours as needed for wheezing or shortness of breath. 01/12/18  Yes Saguier, Percell Miller, PA-C  atorvastatin (  LIPITOR) 40 MG tablet Take 40 mg by mouth daily.   Yes [provider]  carvedilol (COREG) 6.25 MG tablet Take 6.25 mg by mouth 2 (two) times daily with a meal. 12/27/16  Yes [provider]  cilostazol (PLETAL) 100 MG tablet Take 50 mg by mouth 2 (two) times daily.    Yes [provider]  clopidogrel (PLAVIX) 75 MG tablet Take 75 mg by mouth daily.   Yes [provider]  dextromethorphan-guaiFENesin (MUCINEX DM) 30-600 MG 12hr tablet Take 1 tablet by mouth 2 (two) times daily. 01/04/16  Yes Rai, Ripudeep K, MD  ipratropium-albuterol (DUONEB) 0.5-2.5 (3) MG/3ML SOLN Take 3 mLs by nebulization every 6 (six) hours as needed. He is rescheduled every 6 hours for next 2 days, then change to as needed 01/18/19  Yes Elgergawy, Silver Huguenin, MD  metFORMIN (GLUCOPHAGE) 500 MG tablet TAKE 1 TABLET BY MOUTH TWICE DAILY WITH A MEAL Patient taking differently: Take 500  mg by mouth 2 (two) times daily.  02/02/19  Yes Saguier, Percell Miller, PA-C  olmesartan (BENICAR) 40 MG tablet Take 40 mg by mouth daily.   Yes [provider]  Vitamin D, Ergocalciferol, (DRISDOL) 50000 UNITS CAPS capsule Take 50,000 Units by mouth every Monday.    Yes [provider]    Scheduled Meds: . atorvastatin  40 mg Oral Daily  . carvedilol  6.25 mg Oral BID WC  . cilostazol  50 mg Oral BID  . dextromethorphan-guaiFENesin  1 tablet Oral BID  . insulin aspart  0-5 Units Subcutaneous QHS  . insulin aspart  0-9 Units Subcutaneous TID WC  . irbesartan  300 mg Oral Daily  . [START ON 02/18/2019] pantoprazole  40 mg Intravenous Q12H  . Vitamin D (Ergocalciferol)  50,000 Units Oral Q Mon   Infusions: . sodium chloride 125 mL/hr at 02/15/19 0321  . pantoprozole (PROTONIX) infusion 8 mg/hr (02/14/19 2355)   PRN Meds: acetaminophen, albuterol, ondansetron **OR** ondansetron (ZOFRAN) IV   Allergies as of 02/14/2019 - Review Complete 02/14/2019  Allergen Reaction Noted  . Aspirin Swelling 08/24/2015  . Cephalexin Itching 08/24/2015  . Ibuprofen Swelling 08/24/2015    Family History  Problem Relation Age of Onset  . Cancer Mother   . Cancer Father   . Diabetes Sister   . Diabetes Brother     Social History   Socioeconomic History  . Marital status: Single    Spouse name: Not on file  . Number of children: Not on file  . Years of education: Not on file  . Highest education level: Not on file  Occupational History  . Not on file  Social Needs  . Financial resource strain: Not on file  . Food insecurity:    Worry: Not on file    Inability: Not on file  . Transportation needs:    Medical: Not on file    Non-medical: Not on file  Tobacco Use  . Smoking status: Former Smoker    Packs/day: 1.25    Years: 39.00    Pack years: 48.75    Types: Cigarettes    Last attempt to quit: 12/31/2015    Years since quitting: 3.1  . Smokeless tobacco: Never Used    Substance and Sexual Activity  . Alcohol use: No    Alcohol/week: 0.0 standard drinks  . Drug use: No  . Sexual activity: Never  Lifestyle  . Physical activity:    Days per week: Not on file    Minutes per session:  Not on file  . Stress: Not on file  Relationships  . Social connections:    Talks on phone: Not on file    Gets together: Not on file    Attends religious service: Not on file    Active member of club or organization: Not on file    Attends meetings of clubs or organizations: Not on file    Relationship status: Not on file  . Intimate partner violence:    Fear of current or ex partner: Not on file    Emotionally abused: Not on file    Physically abused: Not on file    Forced sexual activity: Not on file  Other Topics Concern  . Not on file  Social History Narrative  . Not on file    REVIEW OF SYSTEMS: Constitutional: No new weakness or fatigue. ENT:  No nose bleeds Pulm: Productive cough of clear sputum.  No new shortness of breath. CV:  No palpitations, no LE edema.  No chest pain GU:  No visible hematuria, no frequency. GI: See HPI. Heme: See HPI Transfusions: No history of transfusion with blood products. Neuro:  No headaches, no peripheral tingling or numbness.  No seizures, no dizziness. Derm:  No itching, no rash or sores.  Endocrine:  No sweats or chills.  No polyuria or dysuria Immunization: Up-to-date on influenza vaccination. Travel:  None beyond local counties in last few months.    PHYSICAL EXAM: Vital signs in last 24 hours: Vitals:   02/15/19 0627 02/15/19 0737  BP: 130/76 (!) 143/85  Pulse: 68 80  Resp: 18 16  Temp: (!) 97.4 F (36.3 C) 98 F (36.7 C)  SpO2: 94% 95%   Wt Readings from Last 3 Encounters:  02/15/19 60.1 kg  02/12/19 60.1 kg  01/29/19 60.1 kg    General: Pleasant, somewhat chronically ill looking but not acutely ill-appearing WM. Head: No facial asymmetry or swelling.  No signs of head trauma. Eyes: No  conjunctival pallor.  EOMI. Ears: Not hard of hearing Nose: No congestion or discharge Mouth: Moist, clear, pink oral mucosa.  Good dentition.  Tongue midline. Neck: No JVD, no masses, no thyromegaly Lungs: Clear bilaterally but overall reduced breath sounds.  No labored breathing.  No cough. Heart: RRR.  No MRG.  S1, S2 present Abdomen: Soft.  Not tender or distended.  Bowel sounds present but hypoactive.  No HSM, masses, hernias, bruits.   Rectal: Deferred.  DRE performed yesterday: dark blood, no masses Musc/Skeltl: No joint redness, swelling or gross deformity. Extremities: No CCE. Neurologic: Oriented x3.  Good historian.  Moves all 4 limbs, strength not tested.  No tremors, no gross deficits. Skin: No rashes, no sores, no telangiectasia. Tattoos: None observed. Nodes: No cervical adenopathy Psych: Pleasant, calm, cooperative.  Intake/Output from previous day: 02/16 0701 - 02/17 0700 In: 1447.5 [I.V.:1447.5] Out: 400 [Urine:400] Intake/Output this shift: No intake/output data recorded.  LAB RESULTS: Recent Labs    02/14/19 1922 02/14/19 2313 02/15/19 0439  WBC 6.5 7.5 6.0  HGB 12.8* 12.0* 11.4*  HCT 38.7* 34.9* 34.2*  PLT 156 141* 127*   BMET Lab Results  Component Value Date   NA 139 02/15/2019   NA 140 02/14/2019   NA 139 01/25/2019   K 3.5 02/15/2019   K 4.0 02/14/2019   K 4.2 01/25/2019   CL 108 02/15/2019   CL 104 02/14/2019   CL 101 01/25/2019   CO2 21 (L) 02/15/2019   CO2 26 02/14/2019   CO2  31 01/25/2019   GLUCOSE 89 02/15/2019   GLUCOSE 125 (H) 02/14/2019   GLUCOSE 195 (H) 01/25/2019   BUN 7 (L) 02/15/2019   BUN 9 02/14/2019   BUN 19 01/25/2019   CREATININE 0.73 02/15/2019   CREATININE 0.78 02/14/2019   CREATININE 0.80 01/25/2019   CALCIUM 8.3 (L) 02/15/2019   CALCIUM 9.2 02/14/2019   CALCIUM 9.7 01/25/2019   LFT Recent Labs    02/14/19 1922 02/15/19 0439  PROT 6.5 5.6*  ALBUMIN 3.5 2.9*  AST 15 12*  ALT 11 10  ALKPHOS 57 41    BILITOT 0.9 0.7   PT/INR Lab Results  Component Value Date   INR 1.06 02/14/2019   INR 1.02 12/30/2015     RADIOLOGY STUDIES: No results found.   IMPRESSION:   *    Painless hematochezia.  Rule out diverticular bleed, rule out neoplasia, rule out colitis.  *    Mild anemia, normocytic.  *     Thrombocytopenia.  Noncritical  *    CAD, PVD.  On chronic Plavix and Pletal.  Last doses yesterday 2/16.  *    Recent community-acquired pneumonia.  CT chest with findings of indeterminate lung nodules and adenopathy.  Patient tells me he was supposed to go back today for follow-up x-ray at Dr. Gustavus Bryant office.  *    First-degree relative, father, with history colon cancer diagnosed in his 51s.  Patient's last colonoscopy was 2008, 12 years ago.   PLAN:     *   ? Colonoscopy, if so question timing.   For now observe, keep on clear liquids.  Consider diagnostic only colonoscopy in 24 to 48 hours vs outpt colonoscopy in next few weeks.   For later would likely be best if he stays of Plavix til scope performed.      Azucena Freed  02/15/2019, 8:14 AM Phone [phone]     Attending Physician Note   I have taken a history, examined the patient and reviewed the chart. I agree with the Advanced Practitioner's note, impression and recommendations.   Bloody diarrhea, painless on Plavix, Pletal. History of diverticulosis, recent antibiotic use for CAP.   R/O infectious etiologies, colitis, diverticular bleed. Ideal to have Plavix and Pletal wash out so will plan for an elective outpatient colonoscopy if minimal or no bleeding going forward. If more than minimal bleeding persists consider a diagnostic colonoscopy (no polypectomy) in hospital. Stool for GI pathogens including C diff. Clear liquid diet for now if bowel prep needed in hospital.   Lucio Edward, MD [facility] [phone]

## 2019-02-16 ENCOUNTER — Telehealth: Payer: Self-pay

## 2019-02-16 ENCOUNTER — Encounter: Payer: Self-pay | Admitting: Gastroenterology

## 2019-02-16 DIAGNOSIS — R911 Solitary pulmonary nodule: Secondary | ICD-10-CM

## 2019-02-16 DIAGNOSIS — K921 Melena: Secondary | ICD-10-CM | POA: Diagnosis not present

## 2019-02-16 DIAGNOSIS — D62 Acute posthemorrhagic anemia: Secondary | ICD-10-CM

## 2019-02-16 DIAGNOSIS — J449 Chronic obstructive pulmonary disease, unspecified: Secondary | ICD-10-CM | POA: Diagnosis not present

## 2019-02-16 DIAGNOSIS — K625 Hemorrhage of anus and rectum: Secondary | ICD-10-CM | POA: Diagnosis not present

## 2019-02-16 DIAGNOSIS — R197 Diarrhea, unspecified: Secondary | ICD-10-CM | POA: Diagnosis not present

## 2019-02-16 DIAGNOSIS — I739 Peripheral vascular disease, unspecified: Secondary | ICD-10-CM

## 2019-02-16 DIAGNOSIS — Z7902 Long term (current) use of antithrombotics/antiplatelets: Secondary | ICD-10-CM | POA: Diagnosis not present

## 2019-02-16 DIAGNOSIS — I25119 Atherosclerotic heart disease of native coronary artery with unspecified angina pectoris: Secondary | ICD-10-CM | POA: Diagnosis not present

## 2019-02-16 LAB — CBC WITH DIFFERENTIAL/PLATELET
Abs Immature Granulocytes: 0.01 10*3/uL (ref 0.00–0.07)
Basophils Absolute: 0 10*3/uL (ref 0.0–0.1)
Basophils Relative: 1 %
Eosinophils Absolute: 0.3 10*3/uL (ref 0.0–0.5)
Eosinophils Relative: 5 %
HCT: 32.7 % — ABNORMAL LOW (ref 39.0–52.0)
Hemoglobin: 11.4 g/dL — ABNORMAL LOW (ref 13.0–17.0)
Immature Granulocytes: 0 %
LYMPHS ABS: 1.4 10*3/uL (ref 0.7–4.0)
LYMPHS PCT: 26 %
MCH: 31.2 pg (ref 26.0–34.0)
MCHC: 34.9 g/dL (ref 30.0–36.0)
MCV: 89.6 fL (ref 80.0–100.0)
Monocytes Absolute: 0.6 10*3/uL (ref 0.1–1.0)
Monocytes Relative: 11 %
NRBC: 0 % (ref 0.0–0.2)
Neutro Abs: 2.9 10*3/uL (ref 1.7–7.7)
Neutrophils Relative %: 57 %
Platelets: 126 10*3/uL — ABNORMAL LOW (ref 150–400)
RBC: 3.65 MIL/uL — ABNORMAL LOW (ref 4.22–5.81)
RDW: 12.9 % (ref 11.5–15.5)
WBC: 5.2 10*3/uL (ref 4.0–10.5)

## 2019-02-16 LAB — GLUCOSE, CAPILLARY: Glucose-Capillary: 79 mg/dL (ref 70–99)

## 2019-02-16 MED ORDER — DM-GUAIFENESIN ER 30-600 MG PO TB12: 1.0000 | ORAL_TABLET | Freq: Two times a day (BID) | ORAL | 0 refills | Status: AC | PRN

## 2019-02-16 NOTE — Telephone Encounter (Signed)
Left message for patient to call back Patient has been tentatively scheduled for colon on 03/01/19 8:30

## 2019-02-16 NOTE — Telephone Encounter (Signed)
-----   Message from Ladene Artist, MD sent at 02/16/2019 10:23 AM EST ----- Regarding: RE: Needs scheduled colonoscopy ASAP  ----- Message ----- From: Clearence Cheek Sent: 02/16/2019  10:19 AM EST To: Ladene Artist, MD, Carla Drape, RN Subject: Needs scheduled colonoscopy ASAP               Good morning. This patient was in the hospital for the past couple of days and had rectal bleeding that resolved.  His Pletal and Plavix was discontinued. Since the bleeding has resolved and his blood counts are stable, he will go home. Dr. Fuller Plan would like him set up for colonoscopy within the next couple of weeks, at longest 3 weeks from now. If Fuller Plan does not have availability can you check the roster and schedule him with whoever could do it?Marland Kitchen  He is an old patient of [facility] but was last scoped in 2008  Dr. Fuller Plan does not feel like he needs to come back to the office before the seizure to be seen by a provider but probably will need to meet with RN to go over instructions etc.  Thanks, S

## 2019-02-16 NOTE — Consult Note (Signed)
[facility] Inpatient Consult   02/16/2019  TREG DIEMER [birth year] [id number]  Patient screened for unplanned readmission om the past 30 days and hospitalizations to check if potential [facility] services are needed . Patient was hospitalized for GI Bleed.  HX of COPD. Chart reviewed and history is from medical record review: ELIZABETH HAFF is a 73 y.o. male with medical history significant of diverticular disease, coronary artery disease, systolic dysfunction CHF, hypertension, hyperlipidemia, tobacco abuse, COPD, who presents to the ER with rectal bleed.  Patient has been having painless bleed for the last 2 days  Primary Care Provider is Saguier, Percell Miller, [place].  This office provides the transition of care follow up. Met with the patient and he is a COPD GOLD III patient he is in agreement for post hospital follow up with COPD EMMI.  He denies issues with medications, transportation or community resource needs.  He was also in a hurry to go home. He accepted a brochure and 24 hour nurse advise line magnet and encouraged to call. For questions contact:   Natividad Brood, RN BSN [facility] Liaison  [phone] business mobile phone Toll free office [phone]

## 2019-02-16 NOTE — Progress Notes (Addendum)
Daily Rounding Note  02/16/2019, 10:00 AM  LOS: 0 days   SUBJECTIVE:   Chief complaint: Bloody stool. Had a bloody, small bowel movement yesterday morning.  No repeat BMs until this morning when he had a loose but more formed, brown stool.  There was no blood.  When he gets up he feels fine.  He is on clear liquids and is getting hungry.  No nausea.  OBJECTIVE:         Vital signs in last 24 hours:    Temp:  [97.8 F (36.6 C)-98.7 F (37.1 C)] 98.7 F (37.1 C) (02/18 0846) Pulse Rate:  [62-69] 66 (02/18 0846) Resp:  [16-18] 16 (02/18 0846) BP: (142-163)/(77-91) 145/91 (02/18 0846) SpO2:  [93 %-97 %] 93 % (02/18 0846) Weight:  [60.4 kg] 60.4 kg (02/18 0523) Last BM Date: 02/15/19 Filed Weights   02/14/19 2209 02/15/19 0627 02/16/19 0523  Weight: 59 kg 60.1 kg 60.4 kg   General: Well. Heart: RRR Chest: Clear.  No labored breathing. Abdomen: Soft.  Not tender or distended.  Active bowel sounds. Extremities: No CCE. Neuro/Psych: Oriented x3.   No weakness or gross deficits.  Intake/Output from previous day: 02/17 0701 - 02/18 0700 In: 4146.3 [P.O.:2160; I.V.:1986.3] Out: 3200 [Urine:3200]  Intake/Output this shift: Total I/O In: 1540 [P.O.:1540] Out: 825 [Urine:825]  Lab Results: Recent Labs    02/14/19 2313 02/15/19 0439 02/16/19 0459  WBC 7.5 6.0 5.2  HGB 12.0* 11.4* 11.4*  HCT 34.9* 34.2* 32.7*  PLT 141* 127* 126*   BMET Recent Labs    02/14/19 1922 02/15/19 0439  NA 140 139  K 4.0 3.5  CL 104 108  CO2 26 21*  GLUCOSE 125* 89  BUN 9 7*  CREATININE 0.78 0.73  CALCIUM 9.2 8.3*   LFT Recent Labs    02/14/19 1922 02/15/19 0439  PROT 6.5 5.6*  ALBUMIN 3.5 2.9*  AST 15 12*  ALT 11 10  ALKPHOS 57 41  BILITOT 0.9 0.7   PT/INR Recent Labs    02/14/19 1922  LABPROT 13.7  INR 1.06   Hepatitis Panel No results for input(s): HEPBSAG, HCVAB, HEPAIGM, HEPBIGM in the last 72  hours.  Studies/Results: No results found.  ASSESMENT:   *   Bloody stools. ? Diverticular leed vs colitis vs neoplasia. Bleeding has resolved. Diverticulosis on 2008 Colonoscopy.  *   Chronic Plavix, Pletal.  Last dose 2/16.     *   Mild blood loss anemia  *   Thrombocytopenia, non-critical.      PLAN   *   Advance to solid food.  Carb mod diet.  *    Okay to discharge the patient.  He should remain off Pletal and Plavix until he undergoes outpatient colonoscopy.  Staff at Dr. Lynne Leader office are working on getting colonoscopy scheduled within the next couple of weeks.  We will notify the patient of the dates. All of this was discussed with the patient and he is in agreement with the plan.   Azucena Freed  02/16/2019, 10:00 AM Phone [phone]     Attending Physician Note   I have taken an interval history, reviewed the chart and examined the patient. I agree with the Advanced Practitioner's note, impression and recommendations.   Bloody diarrhea, resolved.   Outpatient colonoscopy is scheduled on March 2nd. Advance diet as tolerated. Can resume Plavix and Pletal tomorrow as indicated and stop both 5 days prior  to colonoscopy.  GI signing off.   Lucio Edward, MD FACG [phone]

## 2019-02-16 NOTE — Discharge Summary (Addendum)
Patrick [name], is a 73 y.o. male  DOB April 01, 1946  MRN [id number].  Admission date:  02/14/2019  Admitting Physician  Elwyn Reach, MD  Discharge Date:  02/16/2019   Primary MD  Saguier, Percell Miller, PA-C  Recommendations for primary care physician for things to follow:   -Follow-up blood counts since restarting Plavix and Pletal -Pulmonary nodule evaluation  Discharge Diagnosis   Principal Problem:   Rectal bleed Active Problems:   HLD (hyperlipidemia)   Essential hypertension   Diverticulosis of colon   Coronary atherosclerosis of native coronary artery   PAD (peripheral artery disease) (HCC)   COPD GOLD III copd    Solitary pulmonary nodule   Acute blood loss anemia      Past Medical History:  Diagnosis Date  . CAD (coronary artery disease)   . COPD (chronic obstructive pulmonary disease) ([place])   . Hyperlipidemia   . Hypertension     Past Surgical History:  Procedure Laterality Date  . CARDIAC CATHETERIZATION N/A 12/30/2015   Procedure: Left Heart Cath and Coronary Angiography;  Surgeon: Adrian Prows, MD;  Location: [facility];  Service: Cardiovascular;  Laterality: N/A;  . EYE SURGERY     Bilateral cataract sx 2017       HPI  from the history and physical done on the day of admission:    Patrick [name] is a 73 y.o. male with medical history significant of diverticular disease, coronary artery disease, systolic dysfunction CHF, hypertension, hyperlipidemia, tobacco abuse, COPD, who presents to the ER with rectal bleed.  Patient has been having painless bleed for the last 2 days..Denied any injury.  Denied any nausea or vomiting.Symptoms started with loose stools.  It persisted and now. He was seen by Dr Melvyn Novas yesterday.  Patient has done much better.  He has had 3 episodes of bloody stool in the morning  and afternoon and 1 tonight.  So far no bleeding since he arrived the ER.  He is hemodynamically also stable.  Patient is being admitted for observation secondary to rectal bleed.  ED Course: Temperature is 98.5 blood pressure is 156/63, pulse is 91, respiratory rate of 24 oxygen sat 95% on room air.  White count is 6.5 hemoglobin 12.8 and platelets 156.  Rest of the lab work is normal.  Stool guaiac is positive x2.  PT 13.7 INR 1.07.   Hospital Course:  1.  Rectal bleeding with acute blood loss, history of diverticulosis: Resolved. Patient presented with complaints of painless rectal bleeding. Last colonoscopy from 2008 noted diverticulosis and  reported father diagnosed with colon cancer in his 79s.Marland Kitchen  He is on medications Plavix and Pletal and had recently been treated with a course of antibiotics for pneumonia in 12/2018.   Stool guaiacs were positive, and PT/INR/APTT within normal limits. Hemoglobin during hospital stay 12.8-> 12-> 11.4-> 11.4 g/dL with normal MCV and MCH.    He did not require any transfusion.  Suspected symptoms related to the lower GI bleed possibly diverticular.  Dr. Fuller Plan of gastroenterology was consulted, but wanted patient off of Plavix and Pletal least 5 days prior to colonoscopy.  These medications were initially held with plan for possible colonoscopy later this week as an outpatient.  However, colonoscopy unable to be scheduled until 03/01/2019 at 8:30 AM.  He was advised to restart Plavix and Pletal, monitor for recurrence of bleeding, and stop these medications 5 days prior to scheduled colonoscopy.  Follow-up with primary care provider to have repeat blood work within 5 to 7 days to check blood counts was recommended.   2. Diarrhea: Resolved.  Patient had reported diarrhea prior to arrival.  Gastroenterology recommended GI panel and C. difficile testing, but test discontinued as patient having formed stools and no diarrhea since admission.  3.   Essential hypertension: Blood  pressures during hospital stay at times were elevated and ranged from 119/70-163/87.  He was continued on medications of Coreg.  4.  History of coronary artery disease/PAD: Patient denied any complaints of chest pain.  Last cardiac catheterization in 11/2015 by Dr. Einar Gip.  Medications of Plavix and Pletal were held during hospitalization, but recommended to restart and hold prior colonoscopy.   5.  Thrombocytopenia: Acute on chronic.  Platelet counts in the 156->126 prior to discharge.  He had no significant liver enzyme abnormalities noted.  Per review of records available dating back to 2016 platelet counts have been intermittently low.  6.  Diabetes mellitus type 2: Patient on oral medications of metformin.  Last hemoglobin A1c 6.8 on 01/13/2019.  During the hospitalization metformin was held.  Patient advised to restart metformin at discharge.  7.  COPD, without exacerbation: Stable.  Breathing treatments were given as needed.  He had previously qualified for 2 L of oxygen 24/7 during his last admission and 12/2018 for pneumonia.  Patient followed by Dr. Melvyn Novas of pulmonology.    8.  Pulmonary nodule: Previously found to have a 1.6 x 1.2 cm nodule of the left inferior upper lobe during last hospitalization in 12/2018.  Patient with previous history of tobacco use.  He reportedly missed his office follow-up appointment with Dr. Melvyn Novas while here in the hospital.  Patient was advised to reschedule outpatient follow-up with Dr. Melvyn Novas.  9. Hyperlipidemia: Stable.  Continued atorvastatin.  Follow UP  Follow-up Information    Ladene Artist, MD Follow up.   Specialty:  Gastroenterology Contact information: [address] [place] [address] [phone]            Consults obtained: Gastroenterology Dr. Fuller Plan  Discharge Condition: Stable  Diet and Activity recommendation: See Discharge Instructions below  Discharge Instructions    Discharge instructions   Complete by:  As directed     Follow with Primary MD Saguier, Percell Miller, PA-C in 7 days.  Dr. Fuller Plan of gastroenterology have scheduled you for an outpatient colonoscopy on March 2. The number will be provided to their office for further information if needed.  It is okay to resume Plavix or Pletal for now and stop these medicines 5 days prior to  procedure.  Get CBC and BMP -  checked  by Primary MD or SNF MD in 5-7 days ( we routinely change or add medications that can affect your baseline labs and fluid status, therefore we recommend that you get the mentioned basic workup next visit with your PCP, your PCP may decide not to get them or add new tests based on their clinical decision)  Activity: As tolerated   Disposition: Home  Diet: Carbohydrate modified  CODE STATUS: Full  Special Instructions: If you have smoked or chewed Tobacco  in the last 2 yrs please stop smoking, stop any regular Alcohol  and or any Recreational drug use.  On your next visit with your primary care physician please Get Medicines reviewed and adjusted.  Please request your Saguier, Iris Pert to go over all Hospital Tests and Procedure/Radiological results at the follow up, please get all Hospital records sent to your Prim MD by signing hospital release before you go home.  If you experience worsening of your admission symptoms, develop shortness of breath, life threatening emergency, suicidal or homicidal thoughts you must seek medical attention immediately by calling 911 or calling your MD immediately  if symptoms less severe.  You Must read complete instructions/literature along with all the possible adverse reactions/side effects for all the Medicines you take and that have been prescribed to you. Take any new Medicines after you have completely understood and accpet all the possible adverse reactions/side effects.   Do not drive, operate heavy machinery, perform activities at heights, swimming or participation in water activities or provide  baby sitting services if your were admitted for syncope or siezures until you have seen by Primary MD or a Neurologist and advised to do so again.  Do not drive when taking Pain medications.  Do not take more than prescribed Pain, Sleep and Anxiety Medications  Wear Seat belts while driving.   Please note  You were cared for by a hospitalist during your hospital stay. If you have any questions about your discharge medications or the care you received while you were in the hospital after you are discharged, you can call the unit and asked to speak with the hospitalist on call if the hospitalist that took care of you is not available. Once you are discharged, your primary care physician will handle any further medical issues. Please note that NO REFILLS for any discharge medications will be authorized once you are discharged, as it is imperative that you return to your primary care physician (or establish a relationship with a primary care physician if you do not have one) for your aftercare needs so that they can reassess your need for medications and monitor your lab values.        Discharge Medications     Allergies as of 02/16/2019      Reactions   Aspirin Swelling   Cephalexin Itching   Tolerated Cefepime 12/2018   Ibuprofen Swelling      Medication List    TAKE these medications   acetaminophen 325 MG tablet Commonly known as:  TYLENOL Take 2 tablets (650 mg total) by mouth every 6 (six) hours as needed for mild pain or fever (or Fever >/= 101).   albuterol 108 (90 Base) MCG/ACT inhaler Commonly known as:  PROVENTIL HFA;VENTOLIN HFA Inhale 2 puffs into the lungs every 6 (six) hours as needed for wheezing or shortness of breath.   atorvastatin 40 MG tablet Commonly known as:  LIPITOR Take 40 mg by mouth  daily.   carvedilol 6.25 MG tablet Commonly known as:  COREG Take 6.25 mg by mouth 2 (two) times daily with a meal.   cilostazol 100 MG tablet Commonly known as:   PLETAL Take 50 mg by mouth 2 (two) times daily.   clopidogrel 75 MG tablet Commonly known as:  PLAVIX Take 75 mg by mouth daily.   dextromethorphan-guaiFENesin 30-600 MG 12hr tablet Commonly known as:  MUCINEX DM Take 1 tablet by mouth 2 (two) times daily as needed for cough. What changed:    when to take this  reasons to take this   ipratropium-albuterol 0.5-2.5 (3) MG/3ML Soln Commonly known as:  DUONEB Take 3 mLs by nebulization every 6 (six) hours as needed. He is rescheduled every 6 hours for next 2 days, then change to as needed   metFORMIN 500 MG tablet Commonly known as:  GLUCOPHAGE TAKE 1 TABLET BY MOUTH TWICE DAILY WITH A MEAL What changed:    how much to take  how to take this  when to take this  additional instructions   olmesartan 40 MG tablet Commonly known as:  BENICAR Take 40 mg by mouth daily.   Vitamin D (Ergocalciferol) 1.25 MG (50000 UT) Caps capsule Commonly known as:  DRISDOL Take 50,000 Units by mouth every Monday.       Major procedures and Radiology Reports - PLEASE review detailed and final reports for all details, in brief -     Dg Chest 2 View  Result Date: 01/25/2019 CLINICAL DATA:  Cough.  Emphysema. EXAM: CHEST - 2 VIEW COMPARISON:  Chest x-ray and CT scan dated 01/12/2019 FINDINGS: There has been progressive consolidation in the right lower lobe posterior medially since the prior study. Persistent soft tissue nodule in the left midzone. Heart size and vascularity are normal. The lungs are hyperinflated consistent with emphysema. No acute bone abnormality. IMPRESSION: 1. Progressive infiltrate at the right lung base posterior medially. 2. Persistent 17 x 8 mm soft tissue nodule in the left midzone which is worrisome for neoplasm. Electronically Signed   By: Lorriane Shire M.D.   On: 01/25/2019 16:02    Micro Results    No results found for this or any previous visit (from the past 240 hour(s)).     Today   Subjective     Patrick [name] denied having any problems overnight. Denies having any significant diarrhea in the last two days. Only had a small formed bowel moment last night.  Objective   Blood pressure (!) 145/91, pulse 66, temperature 98.7 F (37.1 C), temperature source Oral, resp. rate 16, height 5\' 9"  (1.753 m), weight 60.4 kg, SpO2 93 %.   Intake/Output Summary (Last 24 hours) at 02/16/2019 1157 Last data filed at 02/16/2019 0918 Gross per 24 hour  Intake 5086.32 ml  Output 3450 ml  Net 1636.32 ml    Exam  Constitutional: NAD, calm, comfortable Eyes: PERRL, lids and conjunctivae normal ENMT: Mucous membranes are moist. Posterior pharynx clear of any exudate or lesions.Normal dentition.  Neck: normal, supple, no masses, no thyromegaly Respiratory: clear to auscultation bilaterally, no wheezing, no crackles. Normal respiratory effort. No accessory muscle use.  Cardiovascular: Regular rate and rhythm, no murmurs / rubs / gallops. No extremity edema. 2+ pedal pulses. No carotid bruits.  Abdomen: no tenderness, no masses palpated. No hepatosplenomegaly. Bowel sounds positive.  Musculoskeletal: no clubbing / cyanosis. No joint deformity upper and lower extremities. Good ROM, no contractures. Normal muscle tone.  Skin: no rashes, lesions, ulcers.  No induration Neurologic: CN 2-12 grossly intact. Sensation intact, DTR normal. Strength 5/5 in all 4.  Psychiatric: Normal judgment and insight. Alert and oriented x 3. Normal mood.    Data Review   CBC w Diff:  Lab Results  Component Value Date   WBC 5.2 02/16/2019   HGB 11.4 (L) 02/16/2019   HCT 32.7 (L) 02/16/2019   PLT 126 (L) 02/16/2019   LYMPHOPCT 26 02/16/2019   MONOPCT 11 02/16/2019   EOSPCT 5 02/16/2019   BASOPCT 1 02/16/2019    CMP:  Lab Results  Component Value Date   NA 139 02/15/2019   K 3.5 02/15/2019   CL 108 02/15/2019   CO2 21 (L) 02/15/2019   BUN 7 (L) 02/15/2019   CREATININE 0.73 02/15/2019   PROT 5.6 (L)  02/15/2019   ALBUMIN 2.9 (L) 02/15/2019   BILITOT 0.7 02/15/2019   ALKPHOS 41 02/15/2019   AST 12 (L) 02/15/2019   ALT 10 02/15/2019  .   Total Time in preparing paper work, data evaluation and todays exam - 35 minutes  Norval Morton M.D on 02/16/2019 at 11:57 AM  [facility]   Office  [phone]

## 2019-02-17 DIAGNOSIS — D62 Acute posthemorrhagic anemia: Secondary | ICD-10-CM | POA: Diagnosis present

## 2019-02-17 NOTE — Telephone Encounter (Signed)
Patient is scheduled for colonoscopy and pre-visit.  Per discharge instructions and discharge summary he verbalized understanding of what to do with Pletal and Plavix.

## 2019-02-17 NOTE — Telephone Encounter (Signed)
Left message for patient to call back  

## 2019-02-19 ENCOUNTER — Encounter: Payer: Self-pay | Admitting: Gastroenterology

## 2019-02-19 ENCOUNTER — Ambulatory Visit (INDEPENDENT_AMBULATORY_CARE_PROVIDER_SITE_OTHER)
Admission: RE | Admit: 2019-02-19 | Discharge: 2019-02-19 | Disposition: A | Discharge status: Home / Self Care | Payer: Medicare Other | Source: Ambulatory Visit | Attending: Medical | Admitting: Medical

## 2019-02-19 ENCOUNTER — Ambulatory Visit (AMBULATORY_SURGERY_CENTER): Payer: Self-pay

## 2019-02-19 VITALS — Ht 68.0 in | Wt 134.0 lb

## 2019-02-19 DIAGNOSIS — J181 Lobar pneumonia, unspecified organism: Secondary | ICD-10-CM | POA: Diagnosis not present

## 2019-02-19 DIAGNOSIS — J189 Pneumonia, unspecified organism: Secondary | ICD-10-CM

## 2019-02-19 DIAGNOSIS — K625 Hemorrhage of anus and rectum: Secondary | ICD-10-CM

## 2019-02-19 NOTE — Progress Notes (Signed)
No egg or soy allergy known to patient  No issues with past sedation with any surgeries  or procedures, no intubation problems  No diet pills per patient No home 02 use per patient  Plavix and Pletal blood thinners per patient  Pt denies issues with constipation  No A fib or A flutter  EMMI video sent to pt's e mail. Pt declined Sample of Suprep given.  Lot [id number] exp 12/2020

## 2019-02-23 ENCOUNTER — Ambulatory Visit (INDEPENDENT_AMBULATORY_CARE_PROVIDER_SITE_OTHER): Payer: Medicare Other | Admitting: Medical

## 2019-02-23 ENCOUNTER — Encounter: Payer: Self-pay | Admitting: Medical

## 2019-02-23 VITALS — BP 158/80 | HR 68 | Temp 97.9°F | Resp 16 | Ht 68.0 in | Wt 132.0 lb

## 2019-02-23 DIAGNOSIS — R911 Solitary pulmonary nodule: Secondary | ICD-10-CM

## 2019-02-23 DIAGNOSIS — D649 Anemia, unspecified: Secondary | ICD-10-CM | POA: Diagnosis not present

## 2019-02-23 DIAGNOSIS — K625 Hemorrhage of anus and rectum: Secondary | ICD-10-CM | POA: Diagnosis not present

## 2019-02-23 LAB — CBC WITH DIFFERENTIAL/PLATELET
Basophils Absolute: 0 10*3/uL (ref 0.0–0.1)
Basophils Relative: 0.7 % (ref 0.0–3.0)
Eosinophils Absolute: 0.2 10*3/uL (ref 0.0–0.7)
Eosinophils Relative: 3.3 % (ref 0.0–5.0)
HCT: 39.7 % (ref 39.0–52.0)
Hemoglobin: 13.7 g/dL (ref 13.0–17.0)
Lymphocytes Relative: 18.9 % (ref 12.0–46.0)
Lymphs Abs: 0.9 10*3/uL (ref 0.7–4.0)
MCHC: 34.4 g/dL (ref 30.0–36.0)
MCV: 91.4 fl (ref 78.0–100.0)
Monocytes Absolute: 0.4 10*3/uL (ref 0.1–1.0)
Monocytes Relative: 8.8 % (ref 3.0–12.0)
Neutro Abs: 3.4 10*3/uL (ref 1.4–7.7)
Neutrophils Relative %: 68.3 % (ref 43.0–77.0)
Platelets: 160 10*3/uL (ref 150.0–400.0)
RBC: 4.34 Mil/uL (ref 4.22–5.81)
RDW: 13.9 % (ref 11.5–15.5)
WBC: 5 10*3/uL (ref 4.0–10.5)

## 2019-02-23 LAB — COMPREHENSIVE METABOLIC PANEL
ALT: 6 U/L (ref 0–53)
AST: 9 U/L (ref 0–37)
Albumin: 4.1 g/dL (ref 3.5–5.2)
Alkaline Phosphatase: 66 U/L (ref 39–117)
BUN: 10 mg/dL (ref 6–23)
CO2: 28 mEq/L (ref 19–32)
Calcium: 9.2 mg/dL (ref 8.4–10.5)
Chloride: 102 mEq/L (ref 96–112)
Creatinine, Ser: 0.81 mg/dL (ref 0.40–1.50)
GFR: 93.42 mL/min (ref >60.00)
Glucose, Bld: 229 mg/dL — ABNORMAL HIGH (ref 70–99)
Potassium: 4.1 mEq/L (ref 3.5–5.1)
Sodium: 140 mEq/L (ref 135–145)
Total Bilirubin: 0.6 mg/dL (ref 0.2–1.2)
Total Protein: 6.8 g/dL (ref 6.0–8.3)

## 2019-02-23 MED ORDER — HYDROCHLOROTHIAZIDE 12.5 MG PO CAPS: 12.5000 mg | ORAL_CAPSULE | Freq: Every day | ORAL | 3 refills | Status: DC

## 2019-02-23 NOTE — Patient Instructions (Addendum)
For history of recent rectal bleeding and anemia, will repeat CBC and metabolic panel as instructed by hospital discharge summary.  Continue with Plavix and pletal but then will need to hold the 5 days prior to colonoscopy.  Also it appears that they want you to have repeat CBC 5 days prior to scheduled date future colonoscopy.  Please go through with the colonoscopy.  Please go ahead and arrange for transportation and reschedule that colonoscopy as soon as possible.  With your history of pulmonary nodule and abnormal CT, I do want you to call your pulmonologist office and clarify what is next step whether you need PET scan or biopsy of area of concern.  This is extremely important.  Follow-up in 3-4 weeks or as needed.

## 2019-02-23 NOTE — Progress Notes (Signed)
Subjective:    Patient ID: Patrick [name], male    DOB: Sep 01, 1946, 73 y.o.   MRN: [id number]  HPI  Pt in for follow up.  Pt had some pneumonia was admitted 01/12/2019 to 01/19/2019. He is doing better with lung symptoms. Chest xray reviewed stating near complete resolution. Pt has follow up with pulmonologist. Pt did eventually follow up with specialist but on second follow up missed appointment due to recent hospitalizatin. CT report states he may need pet scan or tissue sampling.   Recent hospital course  1.  Rectal bleeding with acute blood loss, history of diverticulosis: Resolved.Patient presented with complaints of painless rectal bleeding. Last colonoscopy from 2008 noted diverticulosis and reported father diagnosed with colon cancer in his 67s.Marland Kitchen He is on medications Plavix and [place] had recently been treated with a course of antibiotics for pneumonia in 12/2018.  Stool guaiacs were positive, and PT/INR/APTT within normal limits.Hemoglobin during hospital stay 12.8->12->11.4-> 11.4 g/dL with normal MCV and MCH.   He did not require any transfusion.  Suspected symptoms related to the lower GI bleed possibly diverticular.  Dr. Fuller Plan of gastroenterology was consulted, but wanted patient off of Plavix and Pletal least 5 days prior to colonoscopy.  These medications were initially held with plan for possible colonoscopy later this week as an outpatient.  However, colonoscopy unable to be scheduled until 03/01/2019 at 8:30 AM.  He was advised to restart Plavix and Pletal, monitor for recurrence of bleeding, and stop these medications 5 days prior to scheduled colonoscopy.  Follow-up with primary care provider to have repeat blood work within 5 to 7 days to check blood counts was recommended.   2. Diarrhea: Resolved.  Patient had reported diarrhea prior to arrival.  Gastroenterology recommended GI panel and C. difficile testing, but test discontinued as patient having formed stools and no  diarrhea since admission.  3.   Essential hypertension: Blood pressures during hospital stay at times were elevated and ranged from 119/70-163/87.  He was continued on medications of Coreg.  4.  History of coronary artery disease/PAD: Patient denied any complaints of chest pain.  Last cardiac catheterization in 11/2015 by Dr. Einar Gip.  Medications of Plavix and Pletal were held during hospitalization, but recommended to restart and hold prior colonoscopy.  5.  Thrombocytopenia: Acute on chronic.  Platelet counts in the 156->126 prior to discharge.  He had no significant liver enzyme abnormalities noted.  Per review of records available dating back to 2016 platelet counts have been intermittently low.  6.  Diabetes mellitus type 2: Patient on oral medications of metformin.  Last hemoglobin A1c 6.8 on 01/13/2019.  During the hospitalization metformin was held.  Patient advised to restart metformin at discharge.  7.  COPD, without exacerbation: Stable.  Breathing treatments were given as needed.  He had previously qualified for 2 L of oxygen 24/7 during his last admission and 12/2018 for pneumonia.  Patient followed by Dr. Melvyn Novas of pulmonology.    8.  Pulmonary nodule: Previously found to have a 1.6 x 1.2 cm nodule of the left inferior upper lobe during last hospitalization in 12/2018.  Patient with previous history of tobacco use.  He reportedly missed his office follow-up appointment with Dr. Melvyn Novas while here in the hospital.  Patient was advised to reschedule outpatient follow-up with Dr. Melvyn Novas.  9. Hyperlipidemia: Stable.  Continued atorvastatin.  Pt not reporting any further diarrhea, black stools or bloody stools. Good energy. Pt has colonoscopy scheduled for March 01, 2019.  He may need to get rescheduled since he needs a ride to and from procedure.  Pt bp is not low. No shortness of breath. Hx of low platelts but no easy bruising recently or nose bleeds.    Review of Systems    Constitutional: Negative for chills, fatigue and fever.  Respiratory: Negative for cough, chest tightness, shortness of breath and wheezing.   Cardiovascular: Negative for chest pain and palpitations.  Gastrointestinal: Negative for abdominal distention, abdominal pain, anal bleeding, blood in stool and constipation.  Genitourinary: Negative for difficulty urinating, enuresis and flank pain.  Musculoskeletal: Negative for back pain.  Skin: Negative for rash.  Neurological: Negative for dizziness, speech difficulty, weakness, numbness and headaches.  Hematological: Negative for adenopathy. Does not bruise/bleed easily.  Psychiatric/Behavioral: Negative for behavioral problems and decreased concentration. The patient is not hyperactive.     Past Medical History:  Diagnosis Date  . CAD (coronary artery disease)   . Cancer ([place])    bladder  . Cataract   . COPD (chronic obstructive pulmonary disease) ([place])   . Diabetes mellitus without complication ([place])   . Hyperlipidemia   . Hypertension      Social History   Socioeconomic History  . Marital status: Single    Spouse name: Not on file  . Number of children: Not on file  . Years of education: Not on file  . Highest education level: Not on file  Occupational History  . Not on file  Social Needs  . Financial resource strain: Not on file  . Food insecurity:    Worry: Not on file    Inability: Not on file  . Transportation needs:    Medical: Not on file    Non-medical: Not on file  Tobacco Use  . Smoking status: Former Smoker    Packs/day: 1.25    Years: 39.00    Pack years: 48.75    Types: Cigarettes    Last attempt to quit: 12/31/2015    Years since quitting: 3.1  . Smokeless tobacco: Never Used  Substance and Sexual Activity  . Alcohol use: No    Alcohol/week: 0.0 standard drinks  . Drug use: No  . Sexual activity: Never  Lifestyle  . Physical activity:    Days per week: Not on file    Minutes per session: Not on  file  . Stress: Not on file  Relationships  . Social connections:    Talks on phone: Not on file    Gets together: Not on file    Attends religious service: Not on file    Active member of club or organization: Not on file    Attends meetings of clubs or organizations: Not on file    Relationship status: Not on file  . Intimate partner violence:    Fear of current or ex partner: Not on file    Emotionally abused: Not on file    Physically abused: Not on file    Forced sexual activity: Not on file  Other Topics Concern  . Not on file  Social History Narrative  . Not on file    Past Surgical History:  Procedure Laterality Date  . CARDIAC CATHETERIZATION N/A 12/30/2015   Procedure: Left Heart Cath and Coronary Angiography;  Surgeon: Adrian Prows, MD;  Location: [facility];  Service: Cardiovascular;  Laterality: N/A;  . EYE SURGERY     Bilateral cataract sx 2017  . leg vein graft Right 2008    Family History  Problem Relation Age of Onset  . Cancer Mother   . Cancer Father   . Colon cancer Father   . Diabetes Sister   . Diabetes Brother   . Colon polyps Neg Hx   . Rectal cancer Neg Hx   . Stomach cancer Neg Hx     Allergies  Allergen Reactions  . Aspirin Swelling  . Cephalexin Itching    Tolerated Cefepime 12/2018  . Ibuprofen Swelling    Current Outpatient Medications on File Prior to Visit  Medication Sig Dispense Refill  . acetaminophen (TYLENOL) 325 MG tablet Take 2 tablets (650 mg total) by mouth every 6 (six) hours as needed for mild pain or fever (or Fever >/= 101).    Marland Kitchen albuterol (PROVENTIL HFA;VENTOLIN HFA) 108 (90 Base) MCG/ACT inhaler Inhale 2 puffs into the lungs every 6 (six) hours as needed for wheezing or shortness of breath. 1 Inhaler 2  . atorvastatin (LIPITOR) 40 MG tablet Take 40 mg by mouth daily.    . carvedilol (COREG) 6.25 MG tablet Take 6.25 mg by mouth 2 (two) times daily with a meal.  0  . cilostazol (PLETAL) 100 MG tablet Take 50 mg  by mouth 2 (two) times daily.     . clopidogrel (PLAVIX) 75 MG tablet Take 75 mg by mouth daily.    Marland Kitchen dextromethorphan-guaiFENesin (MUCINEX DM) 30-600 MG 12hr tablet Take 1 tablet by mouth 2 (two) times daily as needed for cough. 40 tablet 0  . ipratropium-albuterol (DUONEB) 0.5-2.5 (3) MG/3ML SOLN Take 3 mLs by nebulization every 6 (six) hours as needed. He is rescheduled every 6 hours for next 2 days, then change to as needed 360 mL 0  . metFORMIN (GLUCOPHAGE) 500 MG tablet TAKE 1 TABLET BY MOUTH TWICE DAILY WITH A MEAL (Patient taking differently: Take 500 mg by mouth 2 (two) times daily. ) 180 tablet 0  . olmesartan (BENICAR) 40 MG tablet Take 40 mg by mouth daily.    . Vitamin D, Ergocalciferol, (DRISDOL) 50000 UNITS CAPS capsule Take 50,000 Units by mouth every Monday.      No current facility-administered medications on file prior to visit.     BP (!) 174/71   Pulse 68   Temp 97.9 F (36.6 C) (Oral)   Resp 16   Ht 5\' 8"  (1.727 m)   Wt 132 lb (59.9 kg)   SpO2 97%   BMI 20.07 kg/m       Objective:   Physical Exam  General Mental Status- Alert. General Appearance- Not in acute distress.   Skin General: Color- Normal Color. Moisture- Normal Moisture.  Neck Carotid Arteries- Normal color. Moisture- Normal Moisture. No carotid bruits. No JVD.  Chest and Lung Exam Auscultation: Breath Sounds:-Normal.  Cardiovascular Auscultation:Rythm- Regular. Murmurs & Other Heart Sounds:Auscultation of the heart reveals- No Murmurs.  Abdomen Inspection:-Inspeection Normal. Palpation/Percussion:Note:No mass. Palpation and Percussion of the abdomen reveal- Non Tender, Non Distended + BS, no rebound or guarding.   Neurologic Cranial Nerve exam:- CN III-XII intact(No nystagmus), symmetric smile. Strength:- 5/5 equal and symmetric strength both upper and lower extremities.     Assessment & Plan:  For history of recent rectal bleeding and anemia, will repeat CBC and metabolic panel  as instructed by hospital discharge summary.  Continue with Plavix and pletal but then will need to hold the 5 days prior to colonoscopy.  Also it appears that they want you to have repeat CBC 5 days prior to scheduled date future colonoscopy.  Please go  through with the colonoscopy.  Please go ahead and arrange for transportation and reschedule that colonoscopy as soon as possible.  With your history of pulmonary nodule and abnormal CT, I do want you to call your pulmonologist office and clarify what is next step whether you need PET scan or biopsy of area of concern.  This is extremely important.  Follow-up in 3-4 weeks or as needed.  Mackie Pai, PA-C

## 2019-02-24 ENCOUNTER — Telehealth: Payer: Self-pay | Admitting: Medical

## 2019-02-24 DIAGNOSIS — E119 Type 2 diabetes mellitus without complications: Secondary | ICD-10-CM

## 2019-02-24 NOTE — Telephone Encounter (Signed)
Future 3 month blood sugar test placed.

## 2019-02-25 ENCOUNTER — Encounter: Payer: Self-pay | Admitting: Internal Medicine

## 2019-02-25 NOTE — Assessment & Plan Note (Signed)
Present on cxr 01/12/18 but did not f/u as instructed by PCP - See CT chest 01/12/19 = 1.6 x 1.2 cm nodule cystic component  LUL/ prob postapical segment - Quant Gold Tb plus 01/29/2019  neg - .crypto ag 01/29/2019  Neg - rec f/u CT chest 04/13/19 as poor surgical candidate    Discussed in detail all the  indications, usual  risks and alternatives  relative to the benefits with patient who agrees to proceed with w/u as outlined.

## 2019-03-01 ENCOUNTER — Ambulatory Visit (AMBULATORY_SURGERY_CENTER): Payer: Medicare Other | Admitting: Gastroenterology

## 2019-03-01 ENCOUNTER — Encounter: Payer: Medicare Other | Admitting: Gastroenterology

## 2019-03-01 ENCOUNTER — Encounter: Payer: Self-pay | Admitting: Gastroenterology

## 2019-03-01 ENCOUNTER — Other Ambulatory Visit: Payer: Self-pay

## 2019-03-01 VITALS — BP 137/75 | HR 54 | Temp 96.6°F | Resp 23 | Ht 68.0 in | Wt 134.0 lb

## 2019-03-01 DIAGNOSIS — D125 Benign neoplasm of sigmoid colon: Secondary | ICD-10-CM

## 2019-03-01 DIAGNOSIS — D127 Benign neoplasm of rectosigmoid junction: Secondary | ICD-10-CM | POA: Diagnosis not present

## 2019-03-01 DIAGNOSIS — K648 Other hemorrhoids: Secondary | ICD-10-CM | POA: Diagnosis not present

## 2019-03-01 DIAGNOSIS — K625 Hemorrhage of anus and rectum: Secondary | ICD-10-CM | POA: Diagnosis not present

## 2019-03-01 DIAGNOSIS — I10 Essential (primary) hypertension: Secondary | ICD-10-CM | POA: Diagnosis not present

## 2019-03-01 DIAGNOSIS — D128 Benign neoplasm of rectum: Secondary | ICD-10-CM

## 2019-03-01 DIAGNOSIS — J449 Chronic obstructive pulmonary disease, unspecified: Secondary | ICD-10-CM | POA: Diagnosis not present

## 2019-03-01 DIAGNOSIS — E119 Type 2 diabetes mellitus without complications: Secondary | ICD-10-CM | POA: Diagnosis not present

## 2019-03-01 DIAGNOSIS — D124 Benign neoplasm of descending colon: Secondary | ICD-10-CM

## 2019-03-01 DIAGNOSIS — K921 Melena: Secondary | ICD-10-CM

## 2019-03-01 DIAGNOSIS — D129 Benign neoplasm of anus and anal canal: Secondary | ICD-10-CM

## 2019-03-01 DIAGNOSIS — D123 Benign neoplasm of transverse colon: Secondary | ICD-10-CM | POA: Diagnosis not present

## 2019-03-01 DIAGNOSIS — K5730 Diverticulosis of large intestine without perforation or abscess without bleeding: Secondary | ICD-10-CM | POA: Diagnosis not present

## 2019-03-01 MED ORDER — SODIUM CHLORIDE 0.9 % IV SOLN: 500.0000 mL | Freq: Once | INTRAVENOUS | Status: DC

## 2019-03-01 NOTE — Progress Notes (Signed)
Called to room to assist during endoscopic procedure.  Patient ID and intended procedure confirmed with present staff. Received instructions for my participation in the procedure from the performing physician.  

## 2019-03-01 NOTE — Patient Instructions (Addendum)
   RESUME PLAVIX AND PLETAL TOMORROW AT PRIOR DOSAGE   INFORMATION ON POLYPS ,DIVERTICULOSIS,HEMORRHOIDS ,& HIGH FIBER DIET  GIVEN TO YOU TODAY  AWAIT PATHOLOGY RESULTS    YOU HAD AN ENDOSCOPIC PROCEDURE TODAY AT [place]:   Refer to the procedure report that was given to you for any specific questions about what was found during the examination.  If the procedure report does not answer your questions, please call your gastroenterologist to clarify.  If you requested that your care partner not be given the details of your procedure findings, then the procedure report has been included in a sealed envelope for you to review at your convenience later.  YOU SHOULD EXPECT: Some feelings of bloating in the abdomen. Passage of more gas than usual.  Walking can help get rid of the air that was put into your GI tract during the procedure and reduce the bloating. If you had a lower endoscopy (such as a colonoscopy or flexible sigmoidoscopy) you may notice spotting of blood in your stool or on the toilet paper. If you underwent a bowel prep for your procedure, you may not have a normal bowel movement for a few days.  Please Note:  You might notice some irritation and congestion in your nose or some drainage.  This is from the oxygen used during your procedure.  There is no need for concern and it should clear up in a day or so.  SYMPTOMS TO REPORT IMMEDIATELY:   Following lower endoscopy (colonoscopy or flexible sigmoidoscopy):  Excessive amounts of blood in the stool  Significant tenderness or worsening of abdominal pains  Swelling of the abdomen that is new, acute  Fever of 100F or higher    For urgent or emergent issues, a gastroenterologist can be reached at any hour by calling [phone].   DIET:  We do recommend a small meal at first, but then you may proceed to your regular diet.  Drink plenty of fluids but you should avoid alcoholic beverages for 24  hours.  ACTIVITY:  You should plan to take it easy for the rest of today and you should NOT DRIVE or use heavy machinery until tomorrow (because of the sedation medicines used during the test).    FOLLOW UP: Our staff will call the number listed on your records the next business day following your procedure to check on you and address any questions or concerns that you may have regarding the information given to you following your procedure. If we do not reach you, we will leave a message.  However, if you are feeling well and you are not experiencing any problems, there is no need to return our call.  We will assume that you have returned to your regular daily activities without incident.  If any biopsies were taken you will be contacted by phone or by letter within the next 1-3 weeks.  Please call us at [phone] if you have not heard about the biopsies in 3 weeks.    SIGNATURES/CONFIDENTIALITY: You and/or your care partner have signed paperwork which will be entered into your electronic medical record.  These signatures attest to the fact that that the information above on your After Visit Summary has been reviewed and is understood.  Full responsibility of the confidentiality of this discharge information lies with you and/or your care-partner.

## 2019-03-01 NOTE — Progress Notes (Signed)
Report to PACU, RN, vss, BBS= Clear.  

## 2019-03-01 NOTE — Progress Notes (Signed)
Pt's states no medical or surgical changes since previsit or office visit. 

## 2019-03-01 NOTE — Op Note (Signed)
[employer] Patient Name: Patrick [name] Procedure Date: 03/01/2019 3:09 PM MRN: [id number] Endoscopist: Ladene Artist , MD Age: 73 Referring MD:  Date of Birth: 10-25-1946 Gender: Male Account #: [id number] Procedure:                Colonoscopy Indications:              Hematochezia Medicines:                Monitored Anesthesia Care Procedure:                Pre-Anesthesia Assessment:                           - Prior to the procedure, a History and Physical                            was performed, and patient medications and                            allergies were reviewed. The patient's tolerance of                            previous anesthesia was also reviewed. The risks                            and benefits of the procedure and the sedation                            options and risks were discussed with the patient.                            All questions were answered, and informed consent                            was obtained. Prior Anticoagulants: The patient has                            taken Plavix (clopidogrel) and Pletal last doses                            were 5 days prior to procedure. ASA Grade                            Assessment: III - A patient with severe systemic                            disease. After reviewing the risks and benefits,                            the patient was deemed in satisfactory condition to                            undergo the procedure.  After obtaining informed consent, the colonoscope                            was passed under direct vision. Throughout the                            procedure, the patient's blood pressure, pulse, and                            oxygen saturations were monitored continuously. The                            Model CF-HQ190L [id number]) scope was introduced                            through the anus and advanced to the the cecum,    identified by appendiceal orifice and ileocecal                            valve. The ileocecal valve, appendiceal orifice,                            and rectum were photographed. The quality of the                            bowel preparation was adequate after substantial                            lavage and suctioning. The colonoscopy was                            performed without difficulty. The patient tolerated                            the procedure well. Scope In: 3:19:20 PM Scope Out: 3:36:36 PM Scope Withdrawal Time: 0 hours 14 minutes 35 seconds  Total Procedure Duration: 0 hours 17 minutes 16 seconds  Findings:                 The perianal and digital rectal examinations were                            normal.                           Five sessile polyps were found in the rectum,                            sigmoid colon, descending colon (2) and transverse                            colon. The polyps were 6 to 7 mm in size. These                            polyps were removed with a  cold snare. Resection                            and retrieval were complete.                           Multiple medium-mouthed diverticula were found in                            the left colon. There was narrowing of the colon in                            association with the diverticular opening. There                            was evidence of diverticular spasm. There was no                            evidence of diverticular bleeding.                           Internal hemorrhoids were found during                            retroflexion. The hemorrhoids were medium-sized and                            Grade I (internal hemorrhoids that do not prolapse).                           The exam was otherwise without abnormality on                            direct and retroflexion views. Complications:            No immediate complications. Estimated blood loss:                             None. Estimated Blood Loss:     Estimated blood loss: none. Impression:               - Five 6 to 7 mm polyps in the rectum, in the                            sigmoid colon, in the descending colon and in the                            transverse colon, removed with a cold snare.                            Resected and retrieved.                           - Severe diverticulosis in the left colon. There  was narrowing of the colon in association with the                            diverticular opening. There was evidence of                            diverticular spasm. There was no evidence of                            diverticular bleeding.                           - Internal hemorrhoids.                           - The examination was otherwise normal on direct                            and retroflexion views. Recommendation:           - Hemorrhoidal bleeding favored over diverticular                            bleeding.                           - Consider hemorrhoidal banding.                           - Repeat colonoscopy date to be determined after                            pending pathology results are reviewed for                            surveillance with a more extensive bowel prep.                           - Patient has a contact number available for                            emergencies. The signs and symptoms of potential                            delayed complications were discussed with the                            patient. Return to normal activities tomorrow.                            Written discharge instructions were provided to the                            patient.                           - High fiber diet  long term with six 8 oz glasses                            of water daily.                           - Continue present medications.                           - Await pathology results.                           - Resume  Plavix (clopidogrel) and Pletal at prior                            doses tomorrow. Refer to managing physician for                            further adjustment of therapy. Ladene Artist, MD 03/01/2019 3:45:00 PM This report has been signed electronically.

## 2019-03-02 ENCOUNTER — Telehealth: Payer: Self-pay | Admitting: *Deleted

## 2019-03-02 NOTE — Telephone Encounter (Signed)
  Follow up Call-  Call back number 03/01/2019  Post procedure Call Back phone  # [phone]  Permission to leave phone message Yes  Some recent data might be hidden     Patient questions:  Do you have a fever, pain , or abdominal swelling? No. Pain Score  0 *  Have you tolerated food without any problems? Yes.    Have you been able to return to your normal activities? Yes.    Do you have any questions about your discharge instructions: Diet   No. Medications  No. Follow up visit  No.  Do you have questions or concerns about your Care? No.  Actions: * If pain score is 4 or above: No action needed, pain <4.

## 2019-03-05 ENCOUNTER — Encounter: Payer: Self-pay | Admitting: Gastroenterology

## 2019-04-28 ENCOUNTER — Other Ambulatory Visit: Payer: Self-pay | Admitting: Cardiology

## 2019-04-28 DIAGNOSIS — I70213 Atherosclerosis of native arteries of extremities with intermittent claudication, bilateral legs: Secondary | ICD-10-CM

## 2019-05-26 DIAGNOSIS — H353211 Exudative age-related macular degeneration, right eye, with active choroidal neovascularization: Secondary | ICD-10-CM | POA: Diagnosis not present

## 2019-05-26 DIAGNOSIS — H35373 Puckering of macula, bilateral: Secondary | ICD-10-CM | POA: Diagnosis not present

## 2019-05-26 DIAGNOSIS — H43822 Vitreomacular adhesion, left eye: Secondary | ICD-10-CM | POA: Diagnosis not present

## 2019-05-26 DIAGNOSIS — H353223 Exudative age-related macular degeneration, left eye, with inactive scar: Secondary | ICD-10-CM | POA: Diagnosis not present

## 2019-06-04 ENCOUNTER — Other Ambulatory Visit: Payer: Self-pay | Admitting: Medical

## 2019-07-14 DIAGNOSIS — H353211 Exudative age-related macular degeneration, right eye, with active choroidal neovascularization: Secondary | ICD-10-CM | POA: Diagnosis not present

## 2019-07-14 DIAGNOSIS — H43813 Vitreous degeneration, bilateral: Secondary | ICD-10-CM | POA: Diagnosis not present

## 2019-07-14 DIAGNOSIS — H35373 Puckering of macula, bilateral: Secondary | ICD-10-CM | POA: Diagnosis not present

## 2019-07-14 DIAGNOSIS — H353223 Exudative age-related macular degeneration, left eye, with inactive scar: Secondary | ICD-10-CM | POA: Diagnosis not present

## 2019-08-22 ENCOUNTER — Other Ambulatory Visit: Payer: Self-pay | Admitting: Cardiology

## 2019-08-24 ENCOUNTER — Other Ambulatory Visit: Payer: Self-pay | Admitting: Cardiology

## 2019-08-25 ENCOUNTER — Other Ambulatory Visit: Payer: Self-pay | Admitting: Medical

## 2019-08-25 ENCOUNTER — Other Ambulatory Visit: Payer: Self-pay | Admitting: Cardiology

## 2019-08-26 ENCOUNTER — Other Ambulatory Visit: Payer: Self-pay

## 2019-08-26 DIAGNOSIS — I10 Essential (primary) hypertension: Secondary | ICD-10-CM

## 2019-08-26 MED ORDER — CARVEDILOL 6.25 MG PO TABS: 6.2500 mg | ORAL_TABLET | Freq: Two times a day (BID) | ORAL | 0 refills | Status: DC

## 2019-08-27 ENCOUNTER — Other Ambulatory Visit: Payer: Self-pay | Admitting: Cardiology

## 2019-08-27 DIAGNOSIS — I10 Essential (primary) hypertension: Secondary | ICD-10-CM

## 2019-09-09 ENCOUNTER — Other Ambulatory Visit: Payer: Self-pay | Admitting: Cardiology

## 2019-09-09 ENCOUNTER — Other Ambulatory Visit: Payer: Self-pay | Admitting: Medical

## 2019-09-15 DIAGNOSIS — H43813 Vitreous degeneration, bilateral: Secondary | ICD-10-CM | POA: Diagnosis not present

## 2019-09-15 DIAGNOSIS — H35373 Puckering of macula, bilateral: Secondary | ICD-10-CM | POA: Diagnosis not present

## 2019-09-15 DIAGNOSIS — H353211 Exudative age-related macular degeneration, right eye, with active choroidal neovascularization: Secondary | ICD-10-CM | POA: Diagnosis not present

## 2019-09-15 DIAGNOSIS — H353223 Exudative age-related macular degeneration, left eye, with inactive scar: Secondary | ICD-10-CM | POA: Diagnosis not present

## 2019-10-31 ENCOUNTER — Other Ambulatory Visit: Payer: Self-pay | Admitting: Cardiology

## 2019-11-02 ENCOUNTER — Telehealth: Payer: Self-pay

## 2019-11-02 NOTE — Telephone Encounter (Signed)
Pt called for a refill on Cilostazol 100mg  informed pt we send the prescription 11/02. Pt understood.

## 2019-11-03 ENCOUNTER — Other Ambulatory Visit: Payer: Medicare Other

## 2019-11-16 ENCOUNTER — Other Ambulatory Visit: Payer: Medicare Other

## 2019-11-24 DIAGNOSIS — H353223 Exudative age-related macular degeneration, left eye, with inactive scar: Secondary | ICD-10-CM | POA: Diagnosis not present

## 2019-11-24 DIAGNOSIS — H353211 Exudative age-related macular degeneration, right eye, with active choroidal neovascularization: Secondary | ICD-10-CM | POA: Diagnosis not present

## 2019-11-24 DIAGNOSIS — H35373 Puckering of macula, bilateral: Secondary | ICD-10-CM | POA: Diagnosis not present

## 2019-11-24 DIAGNOSIS — H43822 Vitreomacular adhesion, left eye: Secondary | ICD-10-CM | POA: Diagnosis not present

## 2019-11-29 ENCOUNTER — Other Ambulatory Visit: Payer: Self-pay | Admitting: Cardiology

## 2019-11-29 DIAGNOSIS — I10 Essential (primary) hypertension: Secondary | ICD-10-CM

## 2019-12-03 ENCOUNTER — Other Ambulatory Visit: Payer: Medicare Other

## 2019-12-07 ENCOUNTER — Other Ambulatory Visit: Payer: Self-pay | Admitting: Cardiology

## 2019-12-08 ENCOUNTER — Other Ambulatory Visit: Payer: Self-pay | Admitting: Cardiology

## 2019-12-08 ENCOUNTER — Other Ambulatory Visit: Payer: Medicare Other

## 2019-12-13 ENCOUNTER — Other Ambulatory Visit: Payer: Self-pay | Admitting: Cardiology

## 2019-12-13 ENCOUNTER — Other Ambulatory Visit: Payer: Self-pay | Admitting: Medical

## 2019-12-13 NOTE — Telephone Encounter (Signed)
Left message on machine to call back to schedule follow up visit. ?

## 2019-12-14 ENCOUNTER — Other Ambulatory Visit: Payer: Medicare Other

## 2019-12-28 ENCOUNTER — Other Ambulatory Visit: Payer: Medicare Other

## 2019-12-28 ENCOUNTER — Other Ambulatory Visit: Payer: Self-pay | Admitting: Cardiology

## 2020-01-14 ENCOUNTER — Other Ambulatory Visit: Payer: Self-pay | Admitting: Medical

## 2020-01-17 ENCOUNTER — Telehealth: Payer: Self-pay | Admitting: Cardiology

## 2020-01-29 ENCOUNTER — Other Ambulatory Visit: Payer: Self-pay | Admitting: Cardiology

## 2020-02-17 ENCOUNTER — Other Ambulatory Visit: Payer: Self-pay | Admitting: Cardiology

## 2020-02-18 ENCOUNTER — Other Ambulatory Visit: Payer: Self-pay | Admitting: Medical

## 2020-02-21 ENCOUNTER — Telehealth: Payer: Self-pay

## 2020-02-21 NOTE — Telephone Encounter (Signed)
Yes please

## 2020-02-21 NOTE — Telephone Encounter (Signed)
Pt originally was taking cilostazol 1/2 tab bid but started having leg pain so increased to whole tab bid which has caused him to run out early and and insurance questioning it. He now a refill. How should he be taking it so we can send in refill.//ah

## 2020-02-23 ENCOUNTER — Other Ambulatory Visit: Payer: Self-pay

## 2020-02-23 MED ORDER — CILOSTAZOL 100 MG PO TABS: 100.0000 mg | ORAL_TABLET | Freq: Two times a day (BID) | ORAL | 1 refills | Status: DC

## 2020-02-23 NOTE — Telephone Encounter (Signed)
Pt aware of new rx sent to pharmacy.//ah

## 2020-02-28 ENCOUNTER — Other Ambulatory Visit: Payer: Self-pay | Admitting: Cardiology

## 2020-02-28 DIAGNOSIS — I10 Essential (primary) hypertension: Secondary | ICD-10-CM

## 2020-03-10 ENCOUNTER — Other Ambulatory Visit: Payer: Self-pay | Admitting: Cardiology

## 2020-03-17 ENCOUNTER — Other Ambulatory Visit: Payer: Self-pay | Admitting: Medical

## 2020-03-21 ENCOUNTER — Other Ambulatory Visit: Payer: Self-pay | Admitting: Medical

## 2020-04-16 ENCOUNTER — Other Ambulatory Visit: Payer: Self-pay | Admitting: Medical

## 2020-04-16 ENCOUNTER — Other Ambulatory Visit: Payer: Self-pay | Admitting: Cardiology

## 2020-04-19 ENCOUNTER — Other Ambulatory Visit: Payer: Self-pay | Admitting: Cardiology

## 2020-04-21 DIAGNOSIS — H35373 Puckering of macula, bilateral: Secondary | ICD-10-CM | POA: Diagnosis not present

## 2020-04-21 DIAGNOSIS — H353212 Exudative age-related macular degeneration, right eye, with inactive choroidal neovascularization: Secondary | ICD-10-CM | POA: Diagnosis not present

## 2020-04-21 DIAGNOSIS — H353223 Exudative age-related macular degeneration, left eye, with inactive scar: Secondary | ICD-10-CM | POA: Diagnosis not present

## 2020-04-21 DIAGNOSIS — H43822 Vitreomacular adhesion, left eye: Secondary | ICD-10-CM | POA: Diagnosis not present

## 2020-05-20 ENCOUNTER — Other Ambulatory Visit: Payer: Self-pay | Admitting: Medical

## 2020-05-27 ENCOUNTER — Other Ambulatory Visit: Payer: Self-pay | Admitting: Cardiology

## 2020-05-27 DIAGNOSIS — I10 Essential (primary) hypertension: Secondary | ICD-10-CM

## 2020-06-06 ENCOUNTER — Other Ambulatory Visit: Payer: Self-pay | Admitting: Cardiology

## 2020-06-20 ENCOUNTER — Other Ambulatory Visit: Payer: Self-pay | Admitting: Medical

## 2020-07-07 DIAGNOSIS — H353212 Exudative age-related macular degeneration, right eye, with inactive choroidal neovascularization: Secondary | ICD-10-CM | POA: Diagnosis not present

## 2020-07-07 DIAGNOSIS — H353223 Exudative age-related macular degeneration, left eye, with inactive scar: Secondary | ICD-10-CM | POA: Diagnosis not present

## 2020-07-19 ENCOUNTER — Other Ambulatory Visit: Payer: Self-pay | Admitting: Cardiology

## 2020-07-22 ENCOUNTER — Other Ambulatory Visit: Payer: Self-pay | Admitting: Medical

## 2020-07-24 ENCOUNTER — Other Ambulatory Visit: Payer: Self-pay | Admitting: Cardiology

## 2020-07-24 ENCOUNTER — Other Ambulatory Visit: Payer: Self-pay | Admitting: Medical

## 2020-08-22 IMAGING — CT CT ANGIO CHEST
2 of 7 series · 17 of 46 positions shown · IV contrast (APPLIED)
Comparison: Chest radiograph 01/12/2019

CLINICAL DATA: Three day history of cough. Shortness of breath with
exertion. Focal lesion seen in the left mid lung on prior chest
radiograph.

EXAM:
CT ANGIOGRAPHY CHEST WITH CONTRAST
TECHNIQUE: Multidetector CT imaging of the chest was performed using the
standard protocol during bolus administration of intravenous
contrast. Multiplanar CT image reconstructions and MIPs were
obtained to evaluate the vascular anatomy.
CONTRAST:  100mL S4SU4X-90M IOPAMIDOL (S4SU4X-90M) INJECTION 76%

[Series 8: thins · axial · 0.62mm/px · z∈[+1142,+1442]mm · 14 of 483 slices shown]
[im 27/483  lung]
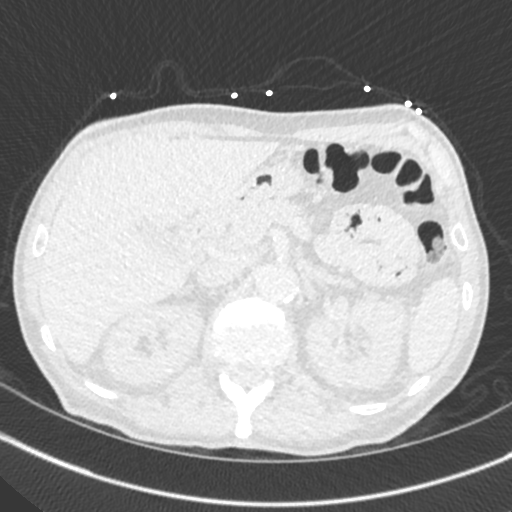
[im 54/483  soft-tissue]
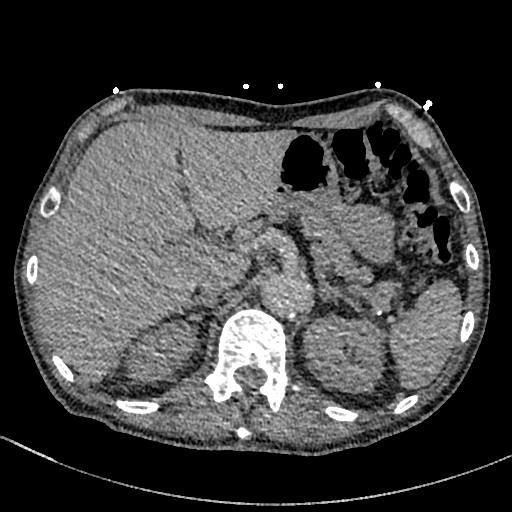
[im 108/483  lung]
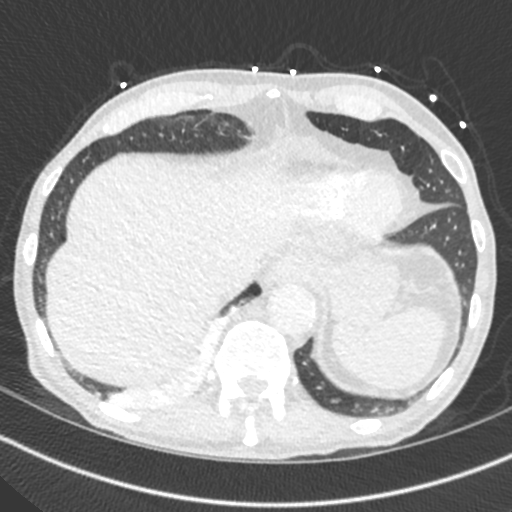
[im 134/483  soft-tissue]
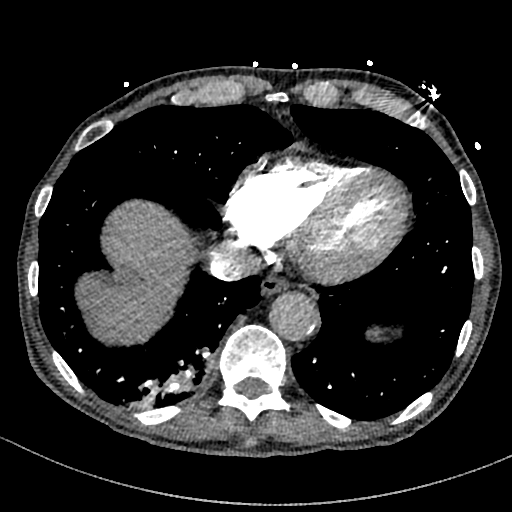
[im 161/483  lung]
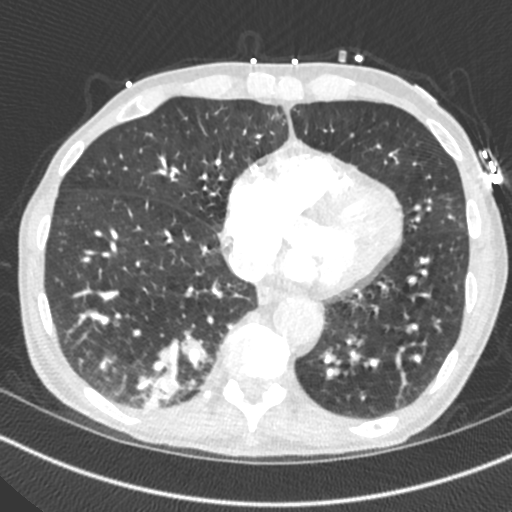
[im 188/483  soft-tissue]
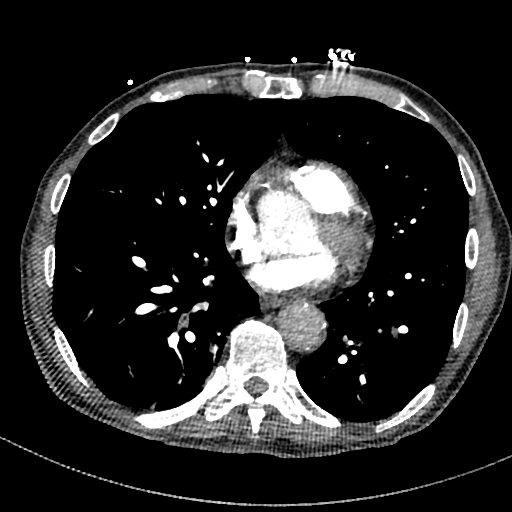
[im 215/483  lung]
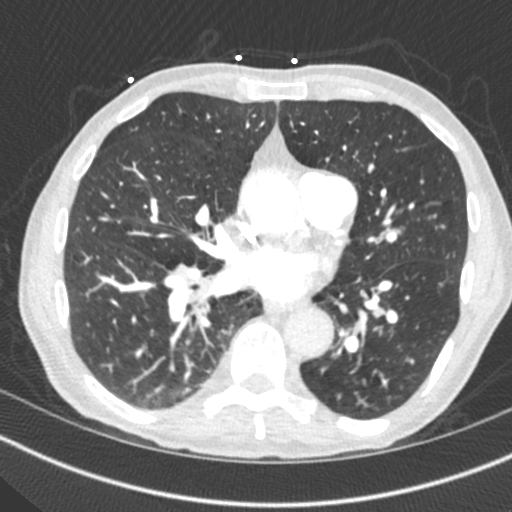
[im 268/483  soft-tissue]
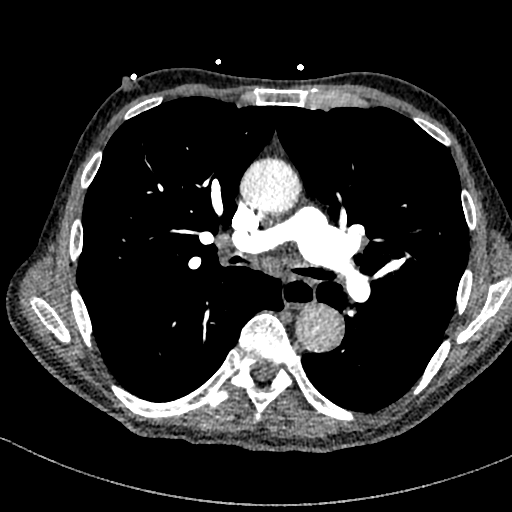
[im 295/483  lung]
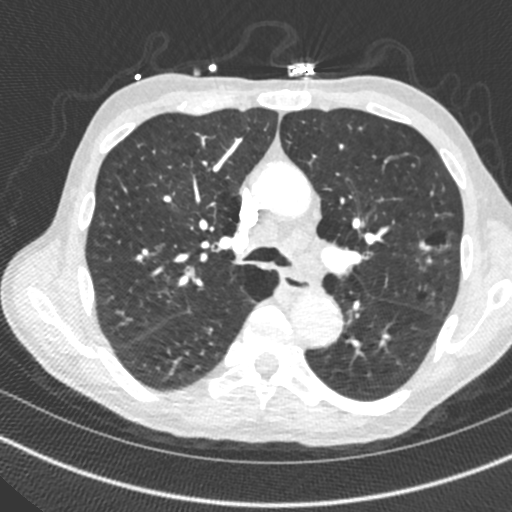
[im 322/483  soft-tissue]
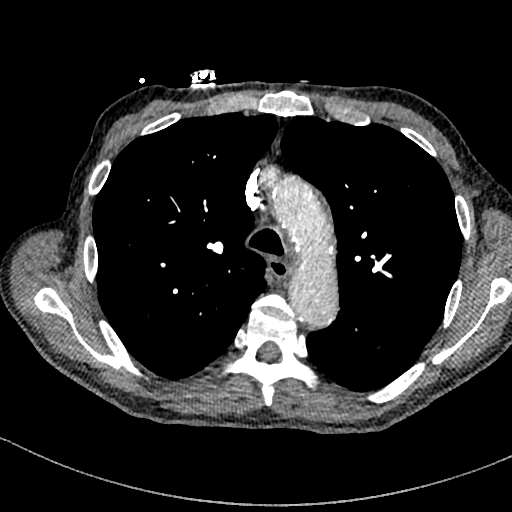
[im 349/483  lung]
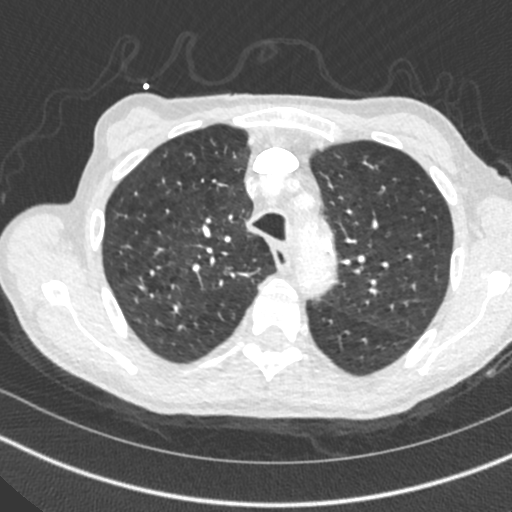
[im 375/483  soft-tissue]
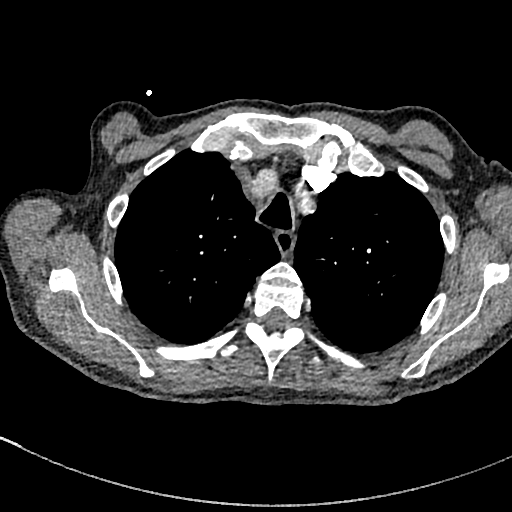
[im 429/483  lung]
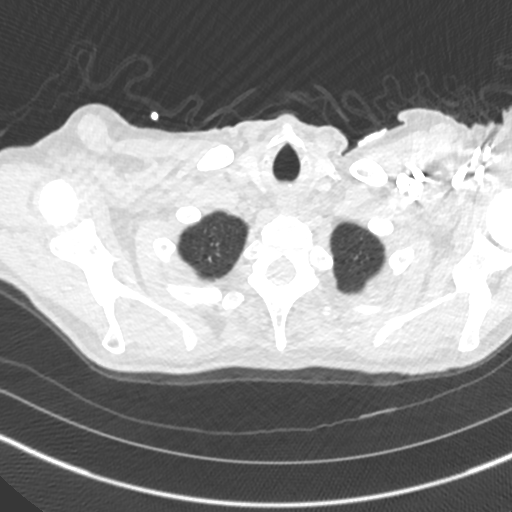
[im 456/483  soft-tissue]
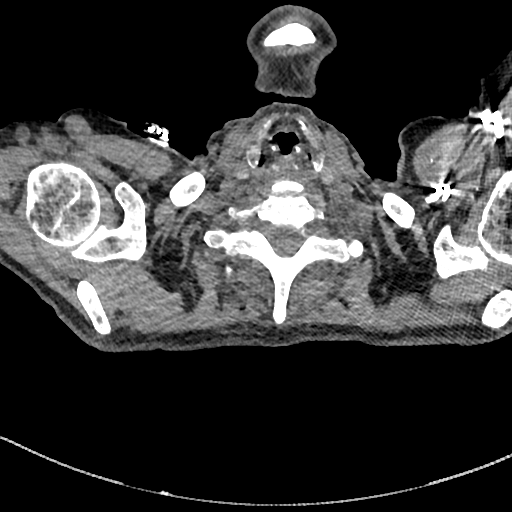

[Series 9: cor · coronal · 0.56mm/px · 3 of 116 slices shown]
[im 29/116  soft-tissue]
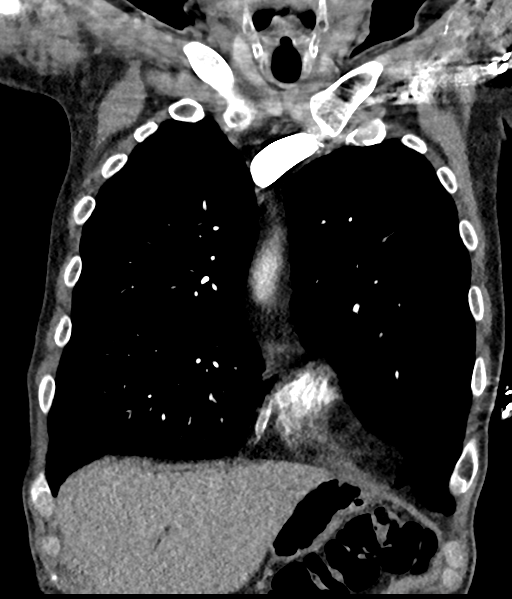
[im 58/116  soft-tissue]
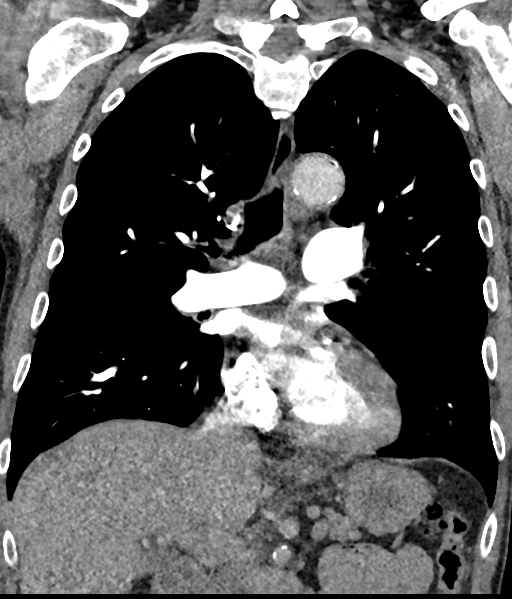
[im 87/116  soft-tissue]
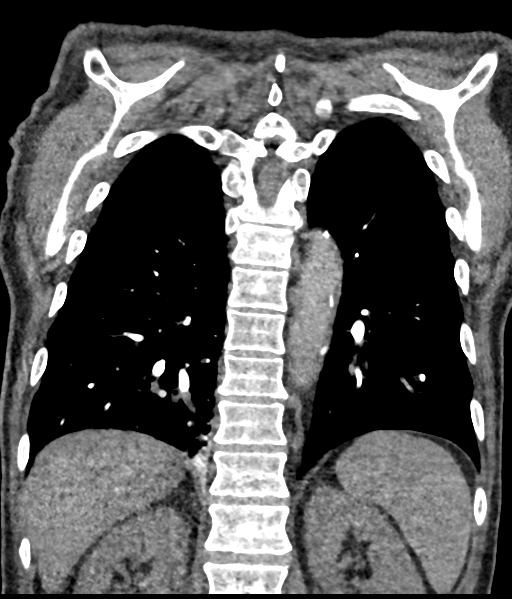

[17 of 46 positions shown; findings below may reference images not displayed]

FINDINGS: Cardiovascular: There is good append sick a shin of the central and
segmental pulmonary arteries. No focal filling defects. No evidence
of significant pulmonary embolus. Normal heart size. No pericardial
effusion. Mild anterior pericardial thickening. Calcification of the
aorta. Normal caliber. Great vessel origins are patent.

Mediastinum/Nodes: Prominent pretracheal and subcarinal lymph nodes,
measuring up to about 1.6 cm short axis dimension.

Lungs/Pleura: Diffuse emphysematous changes throughout the lungs.
Diffuse peribronchial thickening with opacification or mucous
plugging of some of the lower lobe bronchi bilaterally, suggesting
acute or chronic bronchitis or inflammatory airways disease. There
is patchy infiltration in the right lung base posteriorly which may
represent focal pneumonia.

In the left inferior upper lung, corresponding to the chest x-ray
abnormality, there is a circumscribed nodule measuring 1.6 x 1.2 cm
in diameter. There are adjacent cystic changes likely representing
adjacent emphysematous change rather than cavitation of the lesion
itself. Tiny adjacent peribronchovascular nodules are also present.
This lesion is indeterminate and could be inflammatory or neoplastic
in nature. Suspicious for neoplasm since it was present on previous
chest x-ray 1 year ago, with interval more prominent appearance. No
pleural effusions. No pneumothorax.

Upper Abdomen: Exophytic lesion off of the midpole left kidney
probably represents a cyst. No acute changes demonstrated in the
visualized upper abdomen.

Musculoskeletal: No chest wall abnormality. No acute or significant
osseous findings.

Review of the MIP images confirms the above findings.
IMPRESSION: 1. No evidence of significant pulmonary embolus.
2. Diffuse emphysematous changes in the lungs.
3. 1.6 x 1.2 cm nodule in the left inferior upper lung corresponding
to chest x-ray abnormality. This is indeterminate and could
represent inflammatory or neoplastic process. Tiny adjacent
peribronchovascular nodules are present. Prominent mediastinal lymph
nodes are nonspecific in could be metastatic or inflammatory.
Consider one of the following in 3 months for both low-risk and
high-risk individuals: (a) referral to chest clinic, (b) follow-up
PET-CT, or (c) tissue sampling. This recommendation follows the
consensus statement: Guidelines for Management of Incidental
Pulmonary Nodules Detected on CT Images: From the [HOSPITAL]
4. Diffuse peribronchial thickening with opacification or mucous
plugging in the lower lobe bronchi bilaterally, suggesting acute or
chronic bronchitis or inflammatory airways disease.
5. Focal consolidation in the right lower lung likely pneumonia.

Emphysema ([id number].1).

## 2020-08-28 ENCOUNTER — Other Ambulatory Visit: Payer: Self-pay | Admitting: Cardiology

## 2020-08-28 DIAGNOSIS — I10 Essential (primary) hypertension: Secondary | ICD-10-CM

## 2020-08-30 ENCOUNTER — Other Ambulatory Visit: Payer: Self-pay | Admitting: Cardiology

## 2020-08-30 DIAGNOSIS — I10 Essential (primary) hypertension: Secondary | ICD-10-CM

## 2020-10-02 ENCOUNTER — Ambulatory Visit: Payer: Medicare Other | Admitting: Cardiology

## 2020-10-02 ENCOUNTER — Other Ambulatory Visit: Payer: Self-pay

## 2020-10-02 ENCOUNTER — Encounter: Payer: Self-pay | Admitting: Cardiology

## 2020-10-02 VITALS — BP 134/69 | HR 96 | Resp 15 | Ht 68.0 in | Wt 125.0 lb

## 2020-10-02 DIAGNOSIS — I25118 Atherosclerotic heart disease of native coronary artery with other forms of angina pectoris: Secondary | ICD-10-CM | POA: Diagnosis not present

## 2020-10-02 DIAGNOSIS — I10 Essential (primary) hypertension: Secondary | ICD-10-CM | POA: Diagnosis not present

## 2020-10-02 DIAGNOSIS — I739 Peripheral vascular disease, unspecified: Secondary | ICD-10-CM | POA: Diagnosis not present

## 2020-10-02 DIAGNOSIS — E1151 Type 2 diabetes mellitus with diabetic peripheral angiopathy without gangrene: Secondary | ICD-10-CM

## 2020-10-02 DIAGNOSIS — E7800 Pure hypercholesterolemia, unspecified: Secondary | ICD-10-CM | POA: Diagnosis not present

## 2020-10-02 NOTE — Progress Notes (Signed)
Primary Physician/Referring:  Mackie Pai, PA-C  Patient ID: Lorenda Peck, male    DOB: [birth year], 74 y.o.   MRN: [id number]  Chief Complaint  Patient presents with   Follow-up    1 year   Medication Management   HPI:    JAMARE VANATTA  is a 74 y.o. Caucasian gentleman with known coronary artery disease with history of inferior wall MI and stenting to right coronary artery in 2007 which are occluded by angiography in 2017, he has PAD and Aorta to Profunda bypass surgery due to long occlusion of the femoral artery in remote past, has developed a pseudoaneurysm involving the graft but recommended conservative therapy by vascular surgery, hypertension, mixed hyperlipidemia, new onset of diabetes diagnosed in 2018 and prior history of tobacco use disorder with severe emphysema  The patient presents today for follow up office visit. He states overall he is doing well. He was hospitalized in January 2020 for community acquired pneumonia then again in February 2020 for rectal bleeding due to hemorrhoids. He states since that time he has had more fatigue and dyspnea on exertion. He has not been following up with his PCP or pulmonologist since then due to concerns about COVID exposure. He has symptoms of claudication when he walks long distances, but feels this is stable. States that he feels he may have lost some weight. He denies chest pain, palpitations, leg swelling, orthopnea, PND.  Past Medical History:  Diagnosis Date   CAD (coronary artery disease)    Cancer ([place])    bladder   Cataract    COPD (chronic obstructive pulmonary disease) ([place])    Diabetes mellitus without complication ([place])    Hyperlipidemia    Hypertension    Past Surgical History:  Procedure Laterality Date   CARDIAC CATHETERIZATION N/A 12/30/2015   Procedure: Left Heart Cath and Coronary Angiography;  Surgeon: Adrian Prows, MD;  Location: [facility];  Service: Cardiovascular;  Laterality: N/A;   EYE  SURGERY     Bilateral cataract sx 2017   leg vein graft Right 2008   Family History  Problem Relation Age of Onset   Cancer Mother    Cancer Father    Colon cancer Father    Diabetes Sister    Diabetes Brother    Colon polyps Neg Hx    Rectal cancer Neg Hx    Stomach cancer Neg Hx     Social History   Tobacco Use   Smoking status: Former Smoker    Packs/day: 1.25    Years: 39.00    Pack years: 48.75    Types: Cigarettes    Quit date: 12/31/2015    Years since quitting: 4.7   Smokeless tobacco: Never Used  Substance Use Topics   Alcohol use: No    Alcohol/week: 0.0 standard drinks   Marital Status: Single  ROS  Review of Systems  Constitutional: Positive for malaise/fatigue and weight loss.  Cardiovascular: Positive for claudication (stable) and dyspnea on exertion. Negative for chest pain, leg swelling, near-syncope, orthopnea, palpitations, paroxysmal nocturnal dyspnea and syncope.   Objective  Blood pressure 134/69, pulse 96, resp. rate 15, height 5\' 8"  (1.727 m), weight 125 lb (56.7 kg), SpO2 97 %.  Vitals with BMI 10/02/2020 03/01/2019 03/01/2019  Height 5\' 8"  - -  Weight 125 lbs - -  BMI 71.06 - -  Systolic 269 485 462  Diastolic 69 75 71  Pulse 96 54 61     Physical Exam Constitutional:  General: He is not in acute distress. Cardiovascular:     Rate and Rhythm: Normal rate and regular rhythm.     Pulses:          Carotid pulses are 2+ on the right side and 2+ on the left side.      Radial pulses are 2+ on the right side and 2+ on the left side.       Femoral pulses are 2+ on the right side and 2+ on the left side.      Popliteal pulses are 1+ on the right side and 1+ on the left side.       Dorsalis pedis pulses are 0 on the right side and 2+ on the left side.       Posterior tibial pulses are 0 on the right side and 1+ on the left side.     Heart sounds: Normal heart sounds. No murmur heard.  No gallop.      Comments: No lower extremity  edema. No JVD. Pulmonary:     Effort: Pulmonary effort is normal.     Comments: Expiratory wheezing throughout all lung fields. Abdominal:     General: Bowel sounds are normal.     Palpations: Abdomen is soft.    Laboratory examination:   No results for input(s): NA, K, CL, CO2, GLUCOSE, BUN, CREATININE, CALCIUM, GFRNONAA, GFRAA in the last 8760 hours. CrCl cannot be calculated (Patient's most recent lab result is older than the maximum 21 days allowed.).  CMP Latest Ref Rng & Units 02/23/2019 02/15/2019 02/14/2019  Glucose 70 - 99 mg/dL 229(H) 89 125(H)  BUN 6 - 23 mg/dL 10 7(L) 9  Creatinine 0.40 - 1.50 mg/dL 0.81 0.73 0.78  Sodium 135 - 145 mEq/L 140 139 140  Potassium 3.5 - 5.1 mEq/L 4.1 3.5 4.0  Chloride 96 - 112 mEq/L 102 108 104  CO2 19 - 32 mEq/L 28 21(L) 26  Calcium 8.4 - 10.5 mg/dL 9.2 8.3(L) 9.2  Total Protein 6.0 - 8.3 g/dL 6.8 5.6(L) 6.5  Total Bilirubin 0.2 - 1.2 mg/dL 0.6 0.7 0.9  Alkaline Phos 39 - 117 U/L 66 41 57  AST 0 - 37 U/L 9 12(L) 15  ALT 0 - 53 U/L 6 10 11    CBC Latest Ref Rng & Units 02/23/2019 02/16/2019 02/15/2019  WBC 4.0 - 10.5 K/uL 5.0 5.2 6.0  Hemoglobin 13.0 - 17.0 g/dL 13.7 11.4(L) 11.4(L)  Hematocrit 39 - 52 % 39.7 32.7(L) 34.2(L)  Platelets 150 - 400 K/uL 160.0 126(L) 127(L)    Lipid Panel No results for input(s): CHOL, TRIG, LDLCALC, VLDL, HDL, CHOLHDL, LDLDIRECT in the last 8760 hours.  HEMOGLOBIN A1C Lab Results  Component Value Date   HGBA1C 6.8 (H) 01/13/2019   MPG 148.46 01/13/2019   TSH No results for input(s): TSH in the last 8760 hours.  External labs:   Medications and allergies   Allergies  Allergen Reactions   Aspirin Swelling   Cephalexin Itching    Tolerated Cefepime 12/2018   Ibuprofen Swelling     Outpatient Medications Prior to Visit  Medication Sig Dispense Refill   acetaminophen (TYLENOL) 325 MG tablet Take 2 tablets (650 mg total) by mouth every 6 (six) hours as needed for mild pain or fever (or Fever  >/= 101).     albuterol (PROVENTIL HFA;VENTOLIN HFA) 108 (90 Base) MCG/ACT inhaler Inhale 2 puffs into the lungs every 6 (six) hours as needed for wheezing or shortness of breath. 1 Inhaler 2  atorvastatin (LIPITOR) 40 MG tablet Take 1 tablet by mouth once daily 90 tablet 3   carvedilol (COREG) 6.25 MG tablet Take 1 tablet by mouth twice daily 180 tablet 0   Cholecalciferol (VITAMIN D3) 1.25 MG (50000 UT) CAPS Take 1 capsule by mouth once a week 12 capsule 0   cilostazol (PLETAL) 100 MG tablet Take 1 tablet by mouth twice daily 180 tablet 0   clopidogrel (PLAVIX) 75 MG tablet Take 1 tablet by mouth once daily 90 tablet 3   dextromethorphan-guaiFENesin (MUCINEX DM) 30-600 MG 12hr tablet Take 1 tablet by mouth 2 (two) times daily as needed for cough. 40 tablet 0   hydrochlorothiazide (HYDRODIURIL) 12.5 MG tablet TAKE 1 TABLET BY MOUTH IN THE MORNING **PT  NEEDS  TO  SCHEDULE  A  FOLLOW  UP  APPOINTMENT** 30 tablet 0   ipratropium-albuterol (DUONEB) 0.5-2.5 (3) MG/3ML SOLN Take 3 mLs by nebulization every 6 (six) hours as needed. He is rescheduled every 6 hours for next 2 days, then change to as needed 360 mL 0   metFORMIN (GLUCOPHAGE) 500 MG tablet TAKE 1 TABLET BY MOUTH TWICE DAILY WITH MEALS **NEEDS  OFFICE  VISIT** 60 tablet 0   olmesartan (BENICAR) 40 MG tablet Take 1 tablet by mouth once daily 90 tablet 0   Vitamin D, Ergocalciferol, (DRISDOL) 50000 UNITS CAPS capsule Take 50,000 Units by mouth every Monday.      hydrochlorothiazide (MICROZIDE) 12.5 MG capsule Take 1 capsule (12.5 mg total) by mouth daily. (Patient not taking: Reported on 10/02/2020) 30 capsule 3   No facility-administered medications prior to visit.    Radiology:   CT Chest 01/12/2019 1. No evidence of significant pulmonary embolus. 2. Diffuse emphysematous changes in the lungs. 3. 1.6 x 1.2 cm nodule in the left inferior upper lung corresponding to chest x-ray abnormality. This is indeterminate and could  represent inflammatory or neoplastic process. Tiny adjacent peribronchovascular nodules are present. Prominent mediastinal lymph nodes are nonspecific in could be metastatic or inflammatory. Consider one of the following in 3 months for both low-risk and high-risk individuals: (a) referral to chest clinic, (b) follow-up PET-CT, or (c) tissue sampling. This recommendation follows the consensus statement: Guidelines for Management of Incidental Pulmonary Nodules Detected on CT Images: From the Fleischner Society 2017; Radiology 2017; 284:228-243. 4. Diffuse peribronchial thickening with opacification or mucous plugging in the lower lobe bronchi bilaterally, suggesting acute or chronic bronchitis or inflammatory airways disease. 5. Focal consolidation in the right lower lung likely pneumonia.  Cardiac Studies:   Coronary angiogram 12/29/2016: Occluded Mid RCA. Mild diseae Left coronary. Normal LVEF. H/O Stenting to RCA in 2007  Lexiscan myoview stress test 04/27/2018: 1. The resting electrocardiogram demonstrated normal sinus rhythm, normal resting conduction, no resting arrhythmias and normal rest repolarization. Stress EKG is non-diagnostic for ischemia as it a pharmacologic stress using Lexiscan. The patient developed significant symptoms which included Shortness of breath and abdominal pain. 2. SPECT images demonstrate small perfusion abnormality of mild intensity in the basal inferior and mid inferior myocardial wall(s) on the stress images. The defect is not present on the resting images consistent with ischemia. Gated SPECT images reveal normal myocardial thickening and wall motion. The left ventricular ejection fraction was calculated to be 74%. This is a low risk study in view of normal LVEF and small sized defect.  Echocardiogram 05/12/2018: Left ventricle cavity is normal in size. Normal global wall motion. Normal diastolic filling pattern. Calculated EF 63%. Right atrial cavity is mildly  dilated. Moderate (Grade II) aortic regurgitation. Mild aortic valve leaflet calcification. Mild tricuspid regurgitation. Estimated pulmonary artery systolic pressure 30 mmHg. IVC is dilated with blunted respiratory response, suggests elevated central venous pressure. Valvular abnormalities and pulmonary hypertension new since previous study in 2011.  Lower extremity arterial duplex 04/10/2018: Right aorto-profunda bypass graft patent with a 0.77 cm pseudoaneurysmal sac at the insertion site. Diffuse monophasic wavefrom throughout the right leg below the common femoral suggests occluded or severely disease right SFA. Monophasic waveform below knee vessels with severely decreased perfusion (ABI .42) at the right PT.  No hemodynamically significant stenoses are identified in the left lower extremity arterial system. Mildly decreased perfusion of the left lower extremity, noted at the post tibial artery level (ABI 0.88).  Compared to the study done on 07/09/2017, no significant change in ABI. He was given her. Right popliteal stenosis not appreciated in the present study, aneurysmal sac appears to be stable although was previously measured at 2 cm.  EKG:   EKG 10/02/2020: Normal sinus rhythm at rate of 99 beats minute, left radial enlargement, normal axis.  Right bundle branch block.  No evidence of ischemia.  Compared to 04/15/2018, right bundle branch block new.  Assessment     ICD-10-CM   1. PAD (peripheral artery disease) (HCC)  I73.9 EKG 12-Lead  2. Coronary artery disease of native artery of native heart with stable angina pectoris ([place])  I25.118   3. Primary hypertension  I10 TSH  4. Hypercholesteremia  E78.00 Lipid Panel With LDL/HDL Ratio  5. Type 2 diabetes mellitus with diabetic peripheral angiopathy without gangrene, without long-term current use of insulin (HCC)  E11.51 Hgb A1c w/o eAG     Medications Discontinued During This Encounter  Medication Reason   hydrochlorothiazide  (MICROZIDE) 12.5 MG capsule Error    No orders of the defined types were placed in this encounter.   Recommendations:   MILFRED KRAMMES is a 74 y.o. Caucasian gentleman with known coronary artery disease with history of inferior wall MI and stenting to right coronary artery in 2007 which are occluded by angiography in 2017, he has PAD and Aorta to Profunda bypass surgery due to long occlusion of the femoral artery in remote past, has developed a pseudoaneurysm involving the graft but recommended conservative therapy by vascular surgery, hypertension, mixed hyperlipidemia, new onset of diabetes diagnosed in 2018 and prior history of tobacco use disorder with severe emphysema. He was hospitalized in January 2020 for community acquired pneumonia then again in February 2020 for rectal bleeding due to hemorrhoids.  The patient presents today for follow up visit. He has not been seen in our office in more than two years. He has been experiencing fatigue and exertional dyspnea since his hospitalization in January 2020. There is no clinical evidence of heart failure. I suspect these symptoms are related to his severe emphysema. He is encouraged to follow up with his pulmonologist as he has not been seen in 1.5 years.  With regard to PAD, his symptoms of claudication are stable. There is no evidence of limb ischemia. He is on Plavix and Pletal. He was admitted for a rectal bleed secondary to hemorrhoids in February 2020, but has had no recurrence of GI bleeding since then.   The patient has not had blood work in more than a year. I have ordered a lipid panel, A1C, and TSH. His blood pressure is controlled. No changes were made to his medications. He is encouraged to follow up with his PCP  as well. I will see him back in 6 months.   Blair Heys, PA Student 10/02/20  5:03 PM   Patient seen and examined in conjunction with Blair Heys, PA second year student at [employer].  Time spent is in direct patient  face to face encounter not including the teaching and training involved.    Adrian Prows, MD, [facility] 10/02/2020, 5:03 PM Office: [phone]

## 2020-10-03 ENCOUNTER — Other Ambulatory Visit: Payer: Self-pay | Admitting: Cardiology

## 2020-10-10 ENCOUNTER — Telehealth: Payer: Self-pay | Admitting: Medical

## 2020-10-10 NOTE — Telephone Encounter (Signed)
I have not seen pt in more than a year. Got cardiologist office note to review. Ask pt to schedule follow up with me.

## 2020-10-11 NOTE — Telephone Encounter (Signed)
Pt called and lvm to return call 

## 2020-10-11 NOTE — Telephone Encounter (Signed)
Called pt and lvm to return call.  

## 2020-10-16 NOTE — Telephone Encounter (Signed)
Pt called and lvm to return call 

## 2020-11-05 ENCOUNTER — Other Ambulatory Visit: Payer: Self-pay | Admitting: Cardiology

## 2020-11-24 ENCOUNTER — Other Ambulatory Visit: Payer: Self-pay | Admitting: Cardiology

## 2020-11-27 DIAGNOSIS — H353211 Exudative age-related macular degeneration, right eye, with active choroidal neovascularization: Secondary | ICD-10-CM | POA: Diagnosis not present

## 2020-11-27 DIAGNOSIS — H353223 Exudative age-related macular degeneration, left eye, with inactive scar: Secondary | ICD-10-CM | POA: Diagnosis not present

## 2020-11-27 DIAGNOSIS — H35373 Puckering of macula, bilateral: Secondary | ICD-10-CM | POA: Diagnosis not present

## 2020-11-27 DIAGNOSIS — H43822 Vitreomacular adhesion, left eye: Secondary | ICD-10-CM | POA: Diagnosis not present

## 2020-12-02 ENCOUNTER — Other Ambulatory Visit: Payer: Self-pay | Admitting: Cardiology

## 2020-12-02 DIAGNOSIS — I10 Essential (primary) hypertension: Secondary | ICD-10-CM

## 2020-12-04 ENCOUNTER — Other Ambulatory Visit: Payer: Self-pay | Admitting: Cardiology

## 2020-12-04 ENCOUNTER — Telehealth: Payer: Self-pay | Admitting: Medical

## 2020-12-04 MED ORDER — VITAMIN D3 1.25 MG (50000 UT) PO CAPS: 1.0000 | ORAL_CAPSULE | ORAL | 0 refills | Status: AC

## 2020-12-04 NOTE — Telephone Encounter (Signed)
Can you please take care of this?

## 2020-12-04 NOTE — Telephone Encounter (Signed)
Refilled vit D tablet today.

## 2020-12-04 NOTE — Telephone Encounter (Signed)
I have not seen pt since 01-2019. Will you call him and get him scheduled for follow up.

## 2020-12-04 NOTE — Telephone Encounter (Signed)
Is this something that you want me to refill. I don't see any labs with Vitamin D levels. Please advise.

## 2020-12-06 NOTE — Telephone Encounter (Signed)
Pt scheduled for next Monday

## 2020-12-07 ENCOUNTER — Other Ambulatory Visit: Payer: Self-pay | Admitting: Medical

## 2020-12-11 ENCOUNTER — Ambulatory Visit: Payer: Medicare Other | Admitting: Medical

## 2020-12-18 ENCOUNTER — Other Ambulatory Visit: Payer: Self-pay | Admitting: Cardiology

## 2020-12-18 ENCOUNTER — Ambulatory Visit: Payer: Medicare Other | Admitting: Medical

## 2020-12-18 DIAGNOSIS — Z0289 Encounter for other administrative examinations: Secondary | ICD-10-CM

## 2021-01-09 ENCOUNTER — Other Ambulatory Visit: Payer: Self-pay | Admitting: Medical

## 2021-01-11 ENCOUNTER — Other Ambulatory Visit: Payer: Self-pay | Admitting: Medical

## 2021-01-11 MED ORDER — METFORMIN HCL 500 MG PO TABS: ORAL_TABLET | ORAL | 0 refills | Status: DC

## 2021-01-11 NOTE — Telephone Encounter (Signed)
Okay to send in script 

## 2021-01-11 NOTE — Telephone Encounter (Signed)
Patient states he would like medicine to take him through until next appointment on 01/17/2021  Medication: metFORMIN (GLUCOPHAGE) 500 MG tablet [[id number]]    Has the patient contacted their pharmacy? No. (If no, request that the patient contact the pharmacy for the refill.) (If yes, when and what did the pharmacy advise?)  Preferred Pharmacy (with phone number or street name): [facility] [place], [address] [place], [place] [address]  Phone:  [phone] Fax:  [phone]   Agent: Please be advised that RX refills may take up to 3 business days. We ask that you follow-up with your pharmacy.

## 2021-01-11 NOTE — Telephone Encounter (Signed)
Patient has an appointment on 01/19

## 2021-01-11 NOTE — Telephone Encounter (Signed)
Metformin refill sent to pharmacy. See if can get him scheduled. I think he was scheduled to see me recently. Then was cancelled. Not sure why. Not sure if I had to cancel or he did?

## 2021-01-17 ENCOUNTER — Ambulatory Visit: Payer: Medicare Other | Admitting: Medical

## 2021-01-19 ENCOUNTER — Other Ambulatory Visit: Payer: Self-pay | Admitting: Cardiology

## 2021-01-22 ENCOUNTER — Ambulatory Visit: Payer: Medicare Other | Admitting: Medical

## 2021-01-29 ENCOUNTER — Ambulatory Visit: Payer: Medicare Other | Admitting: Medical

## 2021-02-24 ENCOUNTER — Other Ambulatory Visit: Payer: Self-pay | Admitting: Cardiology

## 2021-02-24 ENCOUNTER — Other Ambulatory Visit: Payer: Self-pay | Admitting: Medical

## 2021-03-09 ENCOUNTER — Other Ambulatory Visit: Payer: Self-pay | Admitting: Cardiology

## 2021-03-09 DIAGNOSIS — I10 Essential (primary) hypertension: Secondary | ICD-10-CM

## 2021-03-30 ENCOUNTER — Other Ambulatory Visit: Payer: Self-pay | Admitting: Cardiology

## 2021-04-02 ENCOUNTER — Ambulatory Visit: Payer: Medicare Other | Admitting: Cardiology

## 2021-04-20 DIAGNOSIS — H353211 Exudative age-related macular degeneration, right eye, with active choroidal neovascularization: Secondary | ICD-10-CM | POA: Diagnosis not present

## 2021-04-20 DIAGNOSIS — H35373 Puckering of macula, bilateral: Secondary | ICD-10-CM | POA: Diagnosis not present

## 2021-04-20 DIAGNOSIS — H43813 Vitreous degeneration, bilateral: Secondary | ICD-10-CM | POA: Diagnosis not present

## 2021-04-20 DIAGNOSIS — H353223 Exudative age-related macular degeneration, left eye, with inactive scar: Secondary | ICD-10-CM | POA: Diagnosis not present

## 2021-05-01 ENCOUNTER — Other Ambulatory Visit: Payer: Self-pay | Admitting: Cardiology

## 2021-05-03 ENCOUNTER — Other Ambulatory Visit: Payer: Self-pay

## 2021-05-03 MED ORDER — HYDROCHLOROTHIAZIDE 12.5 MG PO TABS: ORAL_TABLET | ORAL | 0 refills | Status: DC

## 2021-05-03 MED ORDER — CILOSTAZOL 100 MG PO TABS: 100.0000 mg | ORAL_TABLET | Freq: Two times a day (BID) | ORAL | 0 refills | Status: AC

## 2021-05-28 ENCOUNTER — Other Ambulatory Visit: Payer: Self-pay | Admitting: Cardiology

## 2021-06-21 ENCOUNTER — Other Ambulatory Visit: Payer: Self-pay

## 2021-06-21 DIAGNOSIS — I10 Essential (primary) hypertension: Secondary | ICD-10-CM

## 2021-06-21 MED ORDER — CLOPIDOGREL BISULFATE 75 MG PO TABS: 75.0000 mg | ORAL_TABLET | Freq: Every day | ORAL | 1 refills | Status: DC

## 2021-06-21 MED ORDER — VITAMIN D (ERGOCALCIFEROL) 1.25 MG (50000 UNIT) PO CAPS: 50000.0000 [IU] | ORAL_CAPSULE | ORAL | 1 refills | Status: DC

## 2021-06-21 MED ORDER — HYDROCHLOROTHIAZIDE 12.5 MG PO TABS: ORAL_TABLET | ORAL | 1 refills | Status: AC

## 2021-06-21 MED ORDER — CARVEDILOL 6.25 MG PO TABS: 6.2500 mg | ORAL_TABLET | Freq: Two times a day (BID) | ORAL | 1 refills | Status: AC

## 2021-06-21 MED ORDER — ATORVASTATIN CALCIUM 40 MG PO TABS: 1.0000 | ORAL_TABLET | Freq: Every day | ORAL | 3 refills | Status: AC

## 2021-07-05 ENCOUNTER — Other Ambulatory Visit: Payer: Self-pay

## 2021-07-05 MED ORDER — METFORMIN HCL 500 MG PO TABS: ORAL_TABLET | ORAL | 2 refills | Status: AC

## 2021-07-13 ENCOUNTER — Telehealth: Payer: Medicare Other | Admitting: Medical

## 2021-07-20 DIAGNOSIS — H353223 Exudative age-related macular degeneration, left eye, with inactive scar: Secondary | ICD-10-CM | POA: Diagnosis not present

## 2021-07-20 DIAGNOSIS — H353211 Exudative age-related macular degeneration, right eye, with active choroidal neovascularization: Secondary | ICD-10-CM | POA: Diagnosis not present

## 2021-07-25 ENCOUNTER — Other Ambulatory Visit: Payer: Self-pay

## 2021-07-25 ENCOUNTER — Encounter: Payer: Medicare Other | Admitting: Medical

## 2021-08-02 DIAGNOSIS — R634 Abnormal weight loss: Secondary | ICD-10-CM | POA: Diagnosis not present

## 2021-08-02 DIAGNOSIS — R059 Cough, unspecified: Secondary | ICD-10-CM | POA: Diagnosis not present

## 2021-08-02 DIAGNOSIS — Z125 Encounter for screening for malignant neoplasm of prostate: Secondary | ICD-10-CM | POA: Diagnosis not present

## 2021-08-02 DIAGNOSIS — Z8709 Personal history of other diseases of the respiratory system: Secondary | ICD-10-CM | POA: Diagnosis not present

## 2021-08-02 DIAGNOSIS — R051 Acute cough: Secondary | ICD-10-CM | POA: Diagnosis not present

## 2021-08-02 DIAGNOSIS — E119 Type 2 diabetes mellitus without complications: Secondary | ICD-10-CM | POA: Diagnosis not present

## 2021-08-02 DIAGNOSIS — I10 Essential (primary) hypertension: Secondary | ICD-10-CM | POA: Diagnosis not present

## 2021-08-02 DIAGNOSIS — I2510 Atherosclerotic heart disease of native coronary artery without angina pectoris: Secondary | ICD-10-CM | POA: Diagnosis not present

## 2021-08-06 DIAGNOSIS — R9389 Abnormal findings on diagnostic imaging of other specified body structures: Secondary | ICD-10-CM | POA: Diagnosis not present

## 2021-08-15 DIAGNOSIS — R918 Other nonspecific abnormal finding of lung field: Secondary | ICD-10-CM | POA: Diagnosis not present

## 2021-08-15 DIAGNOSIS — I10 Essential (primary) hypertension: Secondary | ICD-10-CM | POA: Diagnosis not present

## 2021-08-15 DIAGNOSIS — I714 Abdominal aortic aneurysm, without rupture: Secondary | ICD-10-CM | POA: Diagnosis not present

## 2021-08-15 DIAGNOSIS — E119 Type 2 diabetes mellitus without complications: Secondary | ICD-10-CM | POA: Diagnosis not present

## 2021-08-15 DIAGNOSIS — E782 Mixed hyperlipidemia: Secondary | ICD-10-CM | POA: Diagnosis not present

## 2021-08-17 DIAGNOSIS — I714 Abdominal aortic aneurysm, without rupture: Secondary | ICD-10-CM | POA: Diagnosis not present

## 2021-08-17 DIAGNOSIS — R918 Other nonspecific abnormal finding of lung field: Secondary | ICD-10-CM | POA: Diagnosis not present

## 2021-08-17 DIAGNOSIS — H353211 Exudative age-related macular degeneration, right eye, with active choroidal neovascularization: Secondary | ICD-10-CM | POA: Diagnosis not present

## 2021-08-17 DIAGNOSIS — R160 Hepatomegaly, not elsewhere classified: Secondary | ICD-10-CM | POA: Diagnosis not present

## 2021-08-17 DIAGNOSIS — I2510 Atherosclerotic heart disease of native coronary artery without angina pectoris: Secondary | ICD-10-CM | POA: Diagnosis not present

## 2021-08-20 DIAGNOSIS — R918 Other nonspecific abnormal finding of lung field: Secondary | ICD-10-CM | POA: Diagnosis not present

## 2021-08-22 DIAGNOSIS — Z8709 Personal history of other diseases of the respiratory system: Secondary | ICD-10-CM | POA: Diagnosis not present

## 2021-08-22 DIAGNOSIS — I10 Essential (primary) hypertension: Secondary | ICD-10-CM | POA: Diagnosis not present

## 2021-08-22 DIAGNOSIS — I2510 Atherosclerotic heart disease of native coronary artery without angina pectoris: Secondary | ICD-10-CM | POA: Diagnosis not present

## 2021-08-22 DIAGNOSIS — E782 Mixed hyperlipidemia: Secondary | ICD-10-CM | POA: Diagnosis not present

## 2021-08-23 DIAGNOSIS — J449 Chronic obstructive pulmonary disease, unspecified: Secondary | ICD-10-CM | POA: Diagnosis not present

## 2021-08-24 DIAGNOSIS — I2510 Atherosclerotic heart disease of native coronary artery without angina pectoris: Secondary | ICD-10-CM | POA: Diagnosis not present

## 2021-08-30 DIAGNOSIS — I714 Abdominal aortic aneurysm, without rupture: Secondary | ICD-10-CM | POA: Diagnosis not present

## 2021-08-30 DIAGNOSIS — I723 Aneurysm of iliac artery: Secondary | ICD-10-CM | POA: Diagnosis not present

## 2021-08-30 DIAGNOSIS — I739 Peripheral vascular disease, unspecified: Secondary | ICD-10-CM | POA: Diagnosis not present

## 2021-08-30 DIAGNOSIS — Z681 Body mass index (BMI) 19 or less, adult: Secondary | ICD-10-CM | POA: Diagnosis not present

## 2021-10-04 DIAGNOSIS — Z7984 Long term (current) use of oral hypoglycemic drugs: Secondary | ICD-10-CM | POA: Diagnosis not present

## 2021-10-04 DIAGNOSIS — C3412 Malignant neoplasm of upper lobe, left bronchus or lung: Secondary | ICD-10-CM | POA: Diagnosis not present

## 2021-10-04 DIAGNOSIS — I110 Hypertensive heart disease with heart failure: Secondary | ICD-10-CM | POA: Diagnosis not present

## 2021-10-04 DIAGNOSIS — J449 Chronic obstructive pulmonary disease, unspecified: Secondary | ICD-10-CM | POA: Diagnosis not present

## 2021-10-04 DIAGNOSIS — I509 Heart failure, unspecified: Secondary | ICD-10-CM | POA: Diagnosis not present

## 2021-10-04 DIAGNOSIS — Z48813 Encounter for surgical aftercare following surgery on the respiratory system: Secondary | ICD-10-CM | POA: Diagnosis not present

## 2021-10-04 DIAGNOSIS — R918 Other nonspecific abnormal finding of lung field: Secondary | ICD-10-CM | POA: Diagnosis not present

## 2021-10-04 DIAGNOSIS — Z7901 Long term (current) use of anticoagulants: Secondary | ICD-10-CM | POA: Diagnosis not present

## 2021-10-04 DIAGNOSIS — I2510 Atherosclerotic heart disease of native coronary artery without angina pectoris: Secondary | ICD-10-CM | POA: Diagnosis not present

## 2021-10-04 DIAGNOSIS — E119 Type 2 diabetes mellitus without complications: Secondary | ICD-10-CM | POA: Diagnosis not present

## 2021-10-04 DIAGNOSIS — E785 Hyperlipidemia, unspecified: Secondary | ICD-10-CM | POA: Diagnosis not present

## 2021-10-11 DIAGNOSIS — H353211 Exudative age-related macular degeneration, right eye, with active choroidal neovascularization: Secondary | ICD-10-CM | POA: Diagnosis not present

## 2021-10-11 DIAGNOSIS — H353223 Exudative age-related macular degeneration, left eye, with inactive scar: Secondary | ICD-10-CM | POA: Diagnosis not present

## 2021-10-12 DIAGNOSIS — R918 Other nonspecific abnormal finding of lung field: Secondary | ICD-10-CM | POA: Diagnosis not present

## 2021-10-15 DIAGNOSIS — R918 Other nonspecific abnormal finding of lung field: Secondary | ICD-10-CM | POA: Diagnosis not present

## 2021-10-19 DIAGNOSIS — C3412 Malignant neoplasm of upper lobe, left bronchus or lung: Secondary | ICD-10-CM | POA: Diagnosis not present

## 2021-10-19 DIAGNOSIS — C771 Secondary and unspecified malignant neoplasm of intrathoracic lymph nodes: Secondary | ICD-10-CM | POA: Diagnosis not present

## 2021-10-19 DIAGNOSIS — C787 Secondary malignant neoplasm of liver and intrahepatic bile duct: Secondary | ICD-10-CM | POA: Diagnosis not present

## 2021-10-19 DIAGNOSIS — Z681 Body mass index (BMI) 19 or less, adult: Secondary | ICD-10-CM | POA: Diagnosis not present

## 2021-10-22 DIAGNOSIS — R918 Other nonspecific abnormal finding of lung field: Secondary | ICD-10-CM | POA: Diagnosis not present

## 2021-11-08 DIAGNOSIS — G939 Disorder of brain, unspecified: Secondary | ICD-10-CM | POA: Diagnosis not present

## 2021-11-08 DIAGNOSIS — C3412 Malignant neoplasm of upper lobe, left bronchus or lung: Secondary | ICD-10-CM | POA: Diagnosis not present

## 2021-11-08 DIAGNOSIS — E119 Type 2 diabetes mellitus without complications: Secondary | ICD-10-CM | POA: Diagnosis not present

## 2021-11-09 DIAGNOSIS — R160 Hepatomegaly, not elsewhere classified: Secondary | ICD-10-CM | POA: Diagnosis not present

## 2021-11-09 DIAGNOSIS — R857 Abnormal histological findings in specimens from digestive organs and abdominal cavity: Secondary | ICD-10-CM | POA: Diagnosis not present

## 2021-11-09 DIAGNOSIS — C787 Secondary malignant neoplasm of liver and intrahepatic bile duct: Secondary | ICD-10-CM | POA: Diagnosis not present

## 2021-11-12 DIAGNOSIS — Z515 Encounter for palliative care: Secondary | ICD-10-CM | POA: Diagnosis not present

## 2021-11-12 DIAGNOSIS — C3412 Malignant neoplasm of upper lobe, left bronchus or lung: Secondary | ICD-10-CM | POA: Diagnosis not present

## 2021-11-12 DIAGNOSIS — Z8551 Personal history of malignant neoplasm of bladder: Secondary | ICD-10-CM | POA: Diagnosis not present

## 2021-11-12 DIAGNOSIS — C7931 Secondary malignant neoplasm of brain: Secondary | ICD-10-CM | POA: Diagnosis not present

## 2021-11-15 DIAGNOSIS — C7931 Secondary malignant neoplasm of brain: Secondary | ICD-10-CM | POA: Diagnosis not present

## 2021-11-15 DIAGNOSIS — C3412 Malignant neoplasm of upper lobe, left bronchus or lung: Secondary | ICD-10-CM | POA: Diagnosis not present

## 2021-11-15 DIAGNOSIS — Z8551 Personal history of malignant neoplasm of bladder: Secondary | ICD-10-CM | POA: Diagnosis not present

## 2021-11-15 DIAGNOSIS — Z515 Encounter for palliative care: Secondary | ICD-10-CM | POA: Diagnosis not present

## 2021-11-19 DIAGNOSIS — Z515 Encounter for palliative care: Secondary | ICD-10-CM | POA: Diagnosis not present

## 2021-11-19 DIAGNOSIS — C7931 Secondary malignant neoplasm of brain: Secondary | ICD-10-CM | POA: Diagnosis not present

## 2021-11-19 DIAGNOSIS — Z8551 Personal history of malignant neoplasm of bladder: Secondary | ICD-10-CM | POA: Diagnosis not present

## 2021-11-19 DIAGNOSIS — C3412 Malignant neoplasm of upper lobe, left bronchus or lung: Secondary | ICD-10-CM | POA: Diagnosis not present

## 2021-11-21 DIAGNOSIS — C7931 Secondary malignant neoplasm of brain: Secondary | ICD-10-CM | POA: Diagnosis not present

## 2021-11-21 DIAGNOSIS — Z8551 Personal history of malignant neoplasm of bladder: Secondary | ICD-10-CM | POA: Diagnosis not present

## 2021-11-21 DIAGNOSIS — Z125 Encounter for screening for malignant neoplasm of prostate: Secondary | ICD-10-CM | POA: Diagnosis not present

## 2021-11-21 DIAGNOSIS — E782 Mixed hyperlipidemia: Secondary | ICD-10-CM | POA: Diagnosis not present

## 2021-11-21 DIAGNOSIS — C3412 Malignant neoplasm of upper lobe, left bronchus or lung: Secondary | ICD-10-CM | POA: Diagnosis not present

## 2021-11-21 DIAGNOSIS — Z515 Encounter for palliative care: Secondary | ICD-10-CM | POA: Diagnosis not present

## 2021-11-21 DIAGNOSIS — E119 Type 2 diabetes mellitus without complications: Secondary | ICD-10-CM | POA: Diagnosis not present

## 2021-11-28 DIAGNOSIS — E46 Unspecified protein-calorie malnutrition: Secondary | ICD-10-CM | POA: Diagnosis not present

## 2021-11-28 DIAGNOSIS — J449 Chronic obstructive pulmonary disease, unspecified: Secondary | ICD-10-CM | POA: Diagnosis not present

## 2021-11-28 DIAGNOSIS — E119 Type 2 diabetes mellitus without complications: Secondary | ICD-10-CM | POA: Diagnosis not present

## 2021-11-28 DIAGNOSIS — C3412 Malignant neoplasm of upper lobe, left bronchus or lung: Secondary | ICD-10-CM | POA: Diagnosis not present

## 2021-11-29 DIAGNOSIS — Z515 Encounter for palliative care: Secondary | ICD-10-CM | POA: Diagnosis not present

## 2021-11-29 DIAGNOSIS — C3412 Malignant neoplasm of upper lobe, left bronchus or lung: Secondary | ICD-10-CM | POA: Diagnosis not present

## 2021-11-29 DIAGNOSIS — Z8551 Personal history of malignant neoplasm of bladder: Secondary | ICD-10-CM | POA: Diagnosis not present

## 2021-11-29 DIAGNOSIS — Z510 Encounter for antineoplastic radiation therapy: Secondary | ICD-10-CM | POA: Diagnosis not present

## 2021-11-29 DIAGNOSIS — C7931 Secondary malignant neoplasm of brain: Secondary | ICD-10-CM | POA: Diagnosis not present

## 2021-11-29 DIAGNOSIS — C787 Secondary malignant neoplasm of liver and intrahepatic bile duct: Secondary | ICD-10-CM | POA: Diagnosis not present

## 2021-11-30 DIAGNOSIS — Z8551 Personal history of malignant neoplasm of bladder: Secondary | ICD-10-CM | POA: Diagnosis not present

## 2021-11-30 DIAGNOSIS — C3412 Malignant neoplasm of upper lobe, left bronchus or lung: Secondary | ICD-10-CM | POA: Diagnosis not present

## 2021-11-30 DIAGNOSIS — Z515 Encounter for palliative care: Secondary | ICD-10-CM | POA: Diagnosis not present

## 2021-11-30 DIAGNOSIS — C7931 Secondary malignant neoplasm of brain: Secondary | ICD-10-CM | POA: Diagnosis not present

## 2021-12-03 DIAGNOSIS — Z8551 Personal history of malignant neoplasm of bladder: Secondary | ICD-10-CM | POA: Diagnosis not present

## 2021-12-03 DIAGNOSIS — C3412 Malignant neoplasm of upper lobe, left bronchus or lung: Secondary | ICD-10-CM | POA: Diagnosis not present

## 2021-12-03 DIAGNOSIS — C7931 Secondary malignant neoplasm of brain: Secondary | ICD-10-CM | POA: Diagnosis not present

## 2021-12-03 DIAGNOSIS — Z515 Encounter for palliative care: Secondary | ICD-10-CM | POA: Diagnosis not present

## 2021-12-04 DIAGNOSIS — C7931 Secondary malignant neoplasm of brain: Secondary | ICD-10-CM | POA: Diagnosis not present

## 2021-12-04 DIAGNOSIS — C3412 Malignant neoplasm of upper lobe, left bronchus or lung: Secondary | ICD-10-CM | POA: Diagnosis not present

## 2021-12-04 DIAGNOSIS — Z515 Encounter for palliative care: Secondary | ICD-10-CM | POA: Diagnosis not present

## 2021-12-04 DIAGNOSIS — Z8551 Personal history of malignant neoplasm of bladder: Secondary | ICD-10-CM | POA: Diagnosis not present

## 2021-12-05 DIAGNOSIS — C7931 Secondary malignant neoplasm of brain: Secondary | ICD-10-CM | POA: Diagnosis not present

## 2021-12-05 DIAGNOSIS — Z8551 Personal history of malignant neoplasm of bladder: Secondary | ICD-10-CM | POA: Diagnosis not present

## 2021-12-05 DIAGNOSIS — C3412 Malignant neoplasm of upper lobe, left bronchus or lung: Secondary | ICD-10-CM | POA: Diagnosis not present

## 2021-12-05 DIAGNOSIS — Z515 Encounter for palliative care: Secondary | ICD-10-CM | POA: Diagnosis not present

## 2021-12-06 DIAGNOSIS — Z515 Encounter for palliative care: Secondary | ICD-10-CM | POA: Diagnosis not present

## 2021-12-06 DIAGNOSIS — C7931 Secondary malignant neoplasm of brain: Secondary | ICD-10-CM | POA: Diagnosis not present

## 2021-12-06 DIAGNOSIS — C3412 Malignant neoplasm of upper lobe, left bronchus or lung: Secondary | ICD-10-CM | POA: Diagnosis not present

## 2021-12-06 DIAGNOSIS — Z8551 Personal history of malignant neoplasm of bladder: Secondary | ICD-10-CM | POA: Diagnosis not present

## 2021-12-07 DIAGNOSIS — Z8551 Personal history of malignant neoplasm of bladder: Secondary | ICD-10-CM | POA: Diagnosis not present

## 2021-12-07 DIAGNOSIS — Z515 Encounter for palliative care: Secondary | ICD-10-CM | POA: Diagnosis not present

## 2021-12-07 DIAGNOSIS — C7931 Secondary malignant neoplasm of brain: Secondary | ICD-10-CM | POA: Diagnosis not present

## 2021-12-07 DIAGNOSIS — C3412 Malignant neoplasm of upper lobe, left bronchus or lung: Secondary | ICD-10-CM | POA: Diagnosis not present

## 2021-12-10 DIAGNOSIS — Z8551 Personal history of malignant neoplasm of bladder: Secondary | ICD-10-CM | POA: Diagnosis not present

## 2021-12-10 DIAGNOSIS — Z515 Encounter for palliative care: Secondary | ICD-10-CM | POA: Diagnosis not present

## 2021-12-10 DIAGNOSIS — C7931 Secondary malignant neoplasm of brain: Secondary | ICD-10-CM | POA: Diagnosis not present

## 2021-12-10 DIAGNOSIS — C3412 Malignant neoplasm of upper lobe, left bronchus or lung: Secondary | ICD-10-CM | POA: Diagnosis not present

## 2021-12-11 DIAGNOSIS — Z515 Encounter for palliative care: Secondary | ICD-10-CM | POA: Diagnosis not present

## 2021-12-11 DIAGNOSIS — C3412 Malignant neoplasm of upper lobe, left bronchus or lung: Secondary | ICD-10-CM | POA: Diagnosis not present

## 2021-12-11 DIAGNOSIS — C7931 Secondary malignant neoplasm of brain: Secondary | ICD-10-CM | POA: Diagnosis not present

## 2021-12-11 DIAGNOSIS — Z8551 Personal history of malignant neoplasm of bladder: Secondary | ICD-10-CM | POA: Diagnosis not present

## 2021-12-12 DIAGNOSIS — Z8551 Personal history of malignant neoplasm of bladder: Secondary | ICD-10-CM | POA: Diagnosis not present

## 2021-12-12 DIAGNOSIS — Z515 Encounter for palliative care: Secondary | ICD-10-CM | POA: Diagnosis not present

## 2021-12-12 DIAGNOSIS — C3412 Malignant neoplasm of upper lobe, left bronchus or lung: Secondary | ICD-10-CM | POA: Diagnosis not present

## 2021-12-12 DIAGNOSIS — C7931 Secondary malignant neoplasm of brain: Secondary | ICD-10-CM | POA: Diagnosis not present

## 2021-12-17 DIAGNOSIS — C3412 Malignant neoplasm of upper lobe, left bronchus or lung: Secondary | ICD-10-CM | POA: Diagnosis not present

## 2022-01-22 ENCOUNTER — Other Ambulatory Visit: Payer: Self-pay | Admitting: Cardiology

## 2022-02-14 DIAGNOSIS — Z7902 Long term (current) use of antithrombotics/antiplatelets: Secondary | ICD-10-CM | POA: Diagnosis not present

## 2022-02-14 DIAGNOSIS — Z87891 Personal history of nicotine dependence: Secondary | ICD-10-CM | POA: Diagnosis not present

## 2022-02-14 DIAGNOSIS — S299XXD Unspecified injury of thorax, subsequent encounter: Secondary | ICD-10-CM | POA: Diagnosis not present

## 2022-02-14 DIAGNOSIS — E1151 Type 2 diabetes mellitus with diabetic peripheral angiopathy without gangrene: Secondary | ICD-10-CM | POA: Diagnosis not present

## 2022-02-14 DIAGNOSIS — I959 Hypotension, unspecified: Secondary | ICD-10-CM | POA: Diagnosis not present

## 2022-02-14 DIAGNOSIS — R000 Tachycardia, unspecified: Secondary | ICD-10-CM | POA: Diagnosis not present

## 2022-02-14 DIAGNOSIS — I252 Old myocardial infarction: Secondary | ICD-10-CM | POA: Diagnosis not present

## 2022-02-14 DIAGNOSIS — R531 Weakness: Secondary | ICD-10-CM | POA: Diagnosis not present

## 2022-02-14 DIAGNOSIS — I509 Heart failure, unspecified: Secondary | ICD-10-CM | POA: Diagnosis not present

## 2022-02-14 DIAGNOSIS — E785 Hyperlipidemia, unspecified: Secondary | ICD-10-CM | POA: Diagnosis not present

## 2022-02-14 DIAGNOSIS — J449 Chronic obstructive pulmonary disease, unspecified: Secondary | ICD-10-CM | POA: Diagnosis not present

## 2022-02-14 DIAGNOSIS — C7931 Secondary malignant neoplasm of brain: Secondary | ICD-10-CM | POA: Diagnosis not present

## 2022-02-14 DIAGNOSIS — I110 Hypertensive heart disease with heart failure: Secondary | ICD-10-CM | POA: Diagnosis not present

## 2022-02-14 DIAGNOSIS — N289 Disorder of kidney and ureter, unspecified: Secondary | ICD-10-CM | POA: Diagnosis not present

## 2022-02-14 DIAGNOSIS — C779 Secondary and unspecified malignant neoplasm of lymph node, unspecified: Secondary | ICD-10-CM | POA: Diagnosis not present

## 2022-02-14 DIAGNOSIS — I2510 Atherosclerotic heart disease of native coronary artery without angina pectoris: Secondary | ICD-10-CM | POA: Diagnosis not present

## 2022-02-14 DIAGNOSIS — S0990XD Unspecified injury of head, subsequent encounter: Secondary | ICD-10-CM | POA: Diagnosis not present

## 2022-02-14 DIAGNOSIS — C7802 Secondary malignant neoplasm of left lung: Secondary | ICD-10-CM | POA: Diagnosis not present

## 2022-02-14 DIAGNOSIS — E860 Dehydration: Secondary | ICD-10-CM | POA: Diagnosis not present

## 2022-02-15 DIAGNOSIS — R531 Weakness: Secondary | ICD-10-CM | POA: Diagnosis not present

## 2022-02-15 DIAGNOSIS — S0990XD Unspecified injury of head, subsequent encounter: Secondary | ICD-10-CM | POA: Diagnosis not present

## 2022-02-27 ENCOUNTER — Other Ambulatory Visit: Payer: Self-pay | Admitting: Cardiology

## 2022-04-29 DEATH — deceased

## 2023-08-22 NOTE — Progress Notes (Signed)
This encounter was created in error - please disregard.
